# Patient Record
Sex: Female | Born: 1955 | Race: White | Hispanic: No | Smoking: Former smoker
Health system: Southern US, Community
[De-identification: ages and names within clinical notes are randomized; demographics above are authoritative.]

## PROBLEM LIST (undated history)

## (undated) DIAGNOSIS — A419 Sepsis, unspecified organism: Secondary | ICD-10-CM

## (undated) DIAGNOSIS — D649 Anemia, unspecified: Secondary | ICD-10-CM

## (undated) DIAGNOSIS — M8580 Other specified disorders of bone density and structure, unspecified site: Secondary | ICD-10-CM

## (undated) DIAGNOSIS — E785 Hyperlipidemia, unspecified: Secondary | ICD-10-CM

## (undated) DIAGNOSIS — B009 Herpesviral infection, unspecified: Secondary | ICD-10-CM

## (undated) DIAGNOSIS — C4490 Unspecified malignant neoplasm of skin, unspecified: Secondary | ICD-10-CM

## (undated) DIAGNOSIS — N189 Chronic kidney disease, unspecified: Secondary | ICD-10-CM

## (undated) DIAGNOSIS — K5790 Diverticulosis of intestine, part unspecified, without perforation or abscess without bleeding: Secondary | ICD-10-CM

## (undated) DIAGNOSIS — R918 Other nonspecific abnormal finding of lung field: Secondary | ICD-10-CM

## (undated) DIAGNOSIS — S99929A Unspecified injury of unspecified foot, initial encounter: Secondary | ICD-10-CM

## (undated) DIAGNOSIS — Z8489 Family history of other specified conditions: Secondary | ICD-10-CM

## (undated) DIAGNOSIS — K219 Gastro-esophageal reflux disease without esophagitis: Secondary | ICD-10-CM

## (undated) HISTORY — PX: COLONOSCOPY: SHX174

## (undated) HISTORY — PX: TUBAL LIGATION: SHX77

## (undated) HISTORY — PX: KIDNEY STONE SURGERY: SHX686

## (undated) HISTORY — PX: DILATION AND CURETTAGE OF UTERUS: SHX78

## (undated) HISTORY — PX: ESOPHAGOGASTRODUODENOSCOPY: SHX1529

---

## 1997-01-26 HISTORY — PX: COLONOSCOPY: SHX174

## 2004-11-21 ENCOUNTER — Ambulatory Visit: Payer: Self-pay | Admitting: Internal Medicine

## 2006-01-04 ENCOUNTER — Ambulatory Visit: Payer: Self-pay | Admitting: Internal Medicine

## 2007-03-06 ENCOUNTER — Ambulatory Visit: Payer: Self-pay | Admitting: Internal Medicine

## 2007-03-21 ENCOUNTER — Ambulatory Visit: Payer: Self-pay | Admitting: Internal Medicine

## 2007-09-22 ENCOUNTER — Ambulatory Visit: Payer: Self-pay | Admitting: Internal Medicine

## 2008-11-10 ENCOUNTER — Ambulatory Visit: Payer: Self-pay | Admitting: Internal Medicine

## 2008-12-31 ENCOUNTER — Ambulatory Visit: Payer: Self-pay | Admitting: Unknown Physician Specialty

## 2009-11-17 ENCOUNTER — Ambulatory Visit: Payer: Self-pay | Admitting: Internal Medicine

## 2009-11-28 ENCOUNTER — Ambulatory Visit: Payer: Self-pay | Admitting: Internal Medicine

## 2010-11-20 ENCOUNTER — Ambulatory Visit: Payer: Self-pay | Admitting: Internal Medicine

## 2011-11-29 ENCOUNTER — Ambulatory Visit: Payer: Self-pay | Admitting: Obstetrics and Gynecology

## 2011-11-30 ENCOUNTER — Ambulatory Visit: Payer: Self-pay | Admitting: Obstetrics and Gynecology

## 2012-05-26 ENCOUNTER — Ambulatory Visit: Payer: Self-pay | Admitting: Unknown Physician Specialty

## 2012-05-26 HISTORY — PX: COLONOSCOPY: SHX174

## 2013-01-29 ENCOUNTER — Ambulatory Visit: Payer: Self-pay | Admitting: Obstetrics and Gynecology

## 2013-06-26 ENCOUNTER — Ambulatory Visit: Payer: Self-pay | Admitting: Internal Medicine

## 2013-07-16 ENCOUNTER — Ambulatory Visit: Payer: Self-pay | Admitting: Urology

## 2013-07-16 DIAGNOSIS — Z01810 Encounter for preprocedural cardiovascular examination: Secondary | ICD-10-CM

## 2013-10-27 ENCOUNTER — Ambulatory Visit: Payer: Self-pay | Admitting: Urology

## 2013-12-26 ENCOUNTER — Emergency Department: Payer: Self-pay | Admitting: Emergency Medicine

## 2013-12-26 LAB — COMPREHENSIVE METABOLIC PANEL
ALBUMIN: 3.8 g/dL (ref 3.4–5.0)
ALK PHOS: 77 U/L
Anion Gap: 6 — ABNORMAL LOW (ref 7–16)
BUN: 20 mg/dL — AB (ref 7–18)
Bilirubin,Total: 0.9 mg/dL (ref 0.2–1.0)
CREATININE: 1.13 mg/dL (ref 0.60–1.30)
Calcium, Total: 8.9 mg/dL (ref 8.5–10.1)
Chloride: 106 mmol/L (ref 98–107)
Co2: 29 mmol/L (ref 21–32)
GFR CALC NON AF AMER: 55 — AB
GLUCOSE: 121 mg/dL — AB (ref 65–99)
Osmolality: 285 (ref 275–301)
POTASSIUM: 3.7 mmol/L (ref 3.5–5.1)
SGOT(AST): 37 U/L (ref 15–37)
SGPT (ALT): 26 U/L (ref 12–78)
Sodium: 141 mmol/L (ref 136–145)
TOTAL PROTEIN: 7.3 g/dL (ref 6.4–8.2)

## 2013-12-26 LAB — URINALYSIS, COMPLETE
Bilirubin,UR: NEGATIVE
GLUCOSE, UR: NEGATIVE mg/dL (ref 0–75)
Nitrite: NEGATIVE
PH: 5 (ref 4.5–8.0)
PROTEIN: NEGATIVE
RBC,UR: 34 /HPF (ref 0–5)
SPECIFIC GRAVITY: 1.016 (ref 1.003–1.030)
Squamous Epithelial: 7

## 2013-12-26 LAB — CBC
HCT: 35.6 % (ref 35.0–47.0)
HGB: 11.3 g/dL — AB (ref 12.0–16.0)
MCH: 26.5 pg (ref 26.0–34.0)
MCHC: 31.9 g/dL — ABNORMAL LOW (ref 32.0–36.0)
MCV: 83 fL (ref 80–100)
PLATELETS: 255 10*3/uL (ref 150–440)
RBC: 4.28 10*6/uL (ref 3.80–5.20)
RDW: 15.8 % — AB (ref 11.5–14.5)
WBC: 6.2 10*3/uL (ref 3.6–11.0)

## 2014-01-14 ENCOUNTER — Ambulatory Visit: Payer: Self-pay | Admitting: Urology

## 2014-02-10 ENCOUNTER — Ambulatory Visit: Payer: Self-pay | Admitting: Internal Medicine

## 2014-02-11 ENCOUNTER — Ambulatory Visit: Payer: Self-pay | Admitting: Urology

## 2014-02-23 ENCOUNTER — Ambulatory Visit: Payer: Self-pay | Admitting: Urology

## 2014-04-29 DIAGNOSIS — E785 Hyperlipidemia, unspecified: Secondary | ICD-10-CM | POA: Insufficient documentation

## 2014-04-29 DIAGNOSIS — B009 Herpesviral infection, unspecified: Secondary | ICD-10-CM | POA: Insufficient documentation

## 2014-04-29 DIAGNOSIS — N393 Stress incontinence (female) (male): Secondary | ICD-10-CM | POA: Insufficient documentation

## 2014-04-29 DIAGNOSIS — N29 Other disorders of kidney and ureter in diseases classified elsewhere: Secondary | ICD-10-CM | POA: Insufficient documentation

## 2014-04-29 DIAGNOSIS — N23 Unspecified renal colic: Secondary | ICD-10-CM | POA: Insufficient documentation

## 2014-04-29 DIAGNOSIS — D649 Anemia, unspecified: Secondary | ICD-10-CM | POA: Insufficient documentation

## 2014-08-03 ENCOUNTER — Ambulatory Visit: Payer: Self-pay | Admitting: Internal Medicine

## 2014-11-12 ENCOUNTER — Other Ambulatory Visit: Payer: Self-pay | Admitting: Internal Medicine

## 2014-11-12 DIAGNOSIS — R911 Solitary pulmonary nodule: Secondary | ICD-10-CM

## 2015-06-14 ENCOUNTER — Other Ambulatory Visit: Payer: Self-pay | Admitting: Internal Medicine

## 2015-06-14 DIAGNOSIS — Z1231 Encounter for screening mammogram for malignant neoplasm of breast: Secondary | ICD-10-CM

## 2015-06-20 ENCOUNTER — Ambulatory Visit
Admission: RE | Admit: 2015-06-20 | Discharge: 2015-06-20 | Disposition: A | Discharge status: Home / Self Care | Payer: 59 | Source: Ambulatory Visit | Attending: Internal Medicine | Admitting: Internal Medicine

## 2015-06-20 DIAGNOSIS — Z1231 Encounter for screening mammogram for malignant neoplasm of breast: Secondary | ICD-10-CM | POA: Insufficient documentation

## 2015-06-28 ENCOUNTER — Other Ambulatory Visit: Payer: Self-pay | Admitting: Internal Medicine

## 2015-06-28 DIAGNOSIS — N6489 Other specified disorders of breast: Secondary | ICD-10-CM

## 2015-07-13 ENCOUNTER — Ambulatory Visit: Payer: PRIVATE HEALTH INSURANCE

## 2015-07-13 ENCOUNTER — Other Ambulatory Visit: Payer: PRIVATE HEALTH INSURANCE

## 2015-07-28 ENCOUNTER — Ambulatory Visit
Admission: RE | Admit: 2015-07-28 | Discharge: 2015-07-28 | Disposition: A | Discharge status: Home / Self Care | Payer: 59 | Source: Ambulatory Visit | Attending: Internal Medicine | Admitting: Internal Medicine

## 2015-07-28 DIAGNOSIS — R928 Other abnormal and inconclusive findings on diagnostic imaging of breast: Secondary | ICD-10-CM | POA: Diagnosis not present

## 2015-07-28 DIAGNOSIS — N6489 Other specified disorders of breast: Secondary | ICD-10-CM | POA: Diagnosis present

## 2015-09-23 ENCOUNTER — Ambulatory Visit
Admission: RE | Admit: 2015-09-23 | Discharge: 2015-09-23 | Disposition: A | Discharge status: Home / Self Care | Payer: 59 | Source: Ambulatory Visit | Attending: Internal Medicine | Admitting: Internal Medicine

## 2015-09-23 DIAGNOSIS — R911 Solitary pulmonary nodule: Secondary | ICD-10-CM

## 2015-09-23 DIAGNOSIS — R918 Other nonspecific abnormal finding of lung field: Secondary | ICD-10-CM | POA: Diagnosis present

## 2016-02-15 ENCOUNTER — Emergency Department
Admission: EM | Admit: 2016-02-15 | Discharge: 2016-02-16 | Disposition: A | Discharge status: Home / Self Care | Payer: 59 | Attending: Emergency Medicine | Admitting: Emergency Medicine

## 2016-02-15 ENCOUNTER — Emergency Department: Payer: 59

## 2016-02-15 DIAGNOSIS — M25531 Pain in right wrist: Secondary | ICD-10-CM | POA: Diagnosis present

## 2016-02-15 DIAGNOSIS — S52501A Unspecified fracture of the lower end of right radius, initial encounter for closed fracture: Secondary | ICD-10-CM | POA: Diagnosis not present

## 2016-02-15 DIAGNOSIS — Y9373 Activity, racquet and hand sports: Secondary | ICD-10-CM | POA: Diagnosis not present

## 2016-02-15 DIAGNOSIS — W010XXA Fall on same level from slipping, tripping and stumbling without subsequent striking against object, initial encounter: Secondary | ICD-10-CM | POA: Diagnosis not present

## 2016-02-15 DIAGNOSIS — Z87891 Personal history of nicotine dependence: Secondary | ICD-10-CM | POA: Diagnosis not present

## 2016-02-15 DIAGNOSIS — Y999 Unspecified external cause status: Secondary | ICD-10-CM | POA: Diagnosis not present

## 2016-02-15 DIAGNOSIS — Y929 Unspecified place or not applicable: Secondary | ICD-10-CM | POA: Insufficient documentation

## 2016-02-15 HISTORY — DX: Other specified disorders of bone density and structure, unspecified site: M85.80

## 2016-02-15 MED ORDER — MORPHINE SULFATE (PF) 4 MG/ML IV SOLN
INTRAVENOUS | Status: AC
  Administered 2016-02-15: 4 mg via INTRAVENOUS
  Filled 2016-02-15: qty 1

## 2016-02-15 MED ORDER — ONDANSETRON HCL 4 MG/2ML IJ SOLN
INTRAMUSCULAR | Status: AC
  Administered 2016-02-15: 4 mg via INTRAVENOUS
  Filled 2016-02-15: qty 2

## 2016-02-15 MED ORDER — MORPHINE SULFATE (PF) 4 MG/ML IV SOLN
4.0000 mg | Freq: Once | INTRAVENOUS | Status: AC
  Administered 2016-02-15: 4 mg via INTRAVENOUS

## 2016-02-15 MED ORDER — BUPIVACAINE HCL (PF) 0.5 % IJ SOLN
10.0000 mL | Freq: Once | INTRAMUSCULAR | Status: AC
  Administered 2016-02-15: 10 mL
  Filled 2016-02-15: qty 30

## 2016-02-15 MED ORDER — ONDANSETRON HCL 4 MG/2ML IJ SOLN
4.0000 mg | Freq: Once | INTRAMUSCULAR | Status: AC
  Administered 2016-02-15: 4 mg via INTRAVENOUS

## 2016-02-15 MED ORDER — OXYCODONE-ACETAMINOPHEN 5-325 MG PO TABS: 1.0000 | ORAL_TABLET | ORAL | Status: DC | PRN

## 2016-02-15 NOTE — ED Notes (Addendum)
Pt arrived via ems - she was playing tennis and tripped and fell on Lt ankle and Rt wrist - She has obvious deformity to rt wrist and slight swelling to outer aspect of lt ankle

## 2016-02-15 NOTE — ED Notes (Signed)
MD at bedside and reduced fracture - sugar tong splint placed and approved by Dr Derrill KayGoodman

## 2016-02-15 NOTE — ED Notes (Signed)
Dr Derrill KayGoodman at bedside and pt placed in finger trap with 5lb weight on arm - MD will return to attempt to reduce fracture at approx 2310 and then tech will splint

## 2016-02-15 NOTE — Discharge Instructions (Signed)
Please seek medical attention for any high fevers, chest pain, shortness of breath, change in behavior, persistent vomiting, bloody stool or any other new or concerning symptoms.   Wrist Fracture A wrist fracture is a break or crack in one of the bones of your wrist. Your wrist is made up of eight small bones at the palm of your hand (carpal bones) and two long bones that make up your forearm (radius and ulna). CAUSES  A direct blow to the wrist.  Falling on an outstretched hand.  Trauma, such as a car accident or a fall. RISK FACTORS Risk factors for wrist fracture include:  Participating in contact and high-risk sports, such as skiing, biking, and ice skating.  Taking steroid medicines.  Smoking.  Being female.  Being Caucasian.  Drinking more than three alcoholic beverages per day.  Having low or lowered bone density (osteoporosis or osteopenia).  Age. Older adults have decreased bone density.  Women who have had menopause.  History of previous fractures. SIGNS AND SYMPTOMS Symptoms of wrist fractures include tenderness, bruising, and inflammation. Additionally, the wrist may hang in an odd position or appear deformed. DIAGNOSIS Diagnosis may include:  Physical exam.  X-ray. TREATMENT Treatment depends on many factors, including the nature and location of the fracture, your age, and your activity level. Treatment for wrist fracture can be nonsurgical or surgical. Nonsurgical Treatment A plaster cast or splint may be applied to your wrist if the bone is in a good position. If the fracture is not in good position, it may be necessary for your health care provider to realign it before applying a splint or cast. Usually, a cast or splint will be worn for several weeks. Surgical Treatment Sometimes the position of the bone is so far out of place that surgery is required to apply a device to hold it together as it heals. Depending on the fracture, there are a number of  options for holding the bone in place while it heals, such as a cast and metal pins. HOME CARE INSTRUCTIONS  Keep your injured wrist elevated and move your fingers as much as possible.  Do not put pressure on any part of your cast or splint. It may break.  Use a plastic bag to protect your cast or splint from water while bathing or showering. Do not lower your cast or splint into water.  Take medicines only as directed by your health care provider.  Keep your cast or splint clean and dry. If it becomes wet, damaged, or suddenly feels too tight, contact your health care provider right away.  Do not use any tobacco products including cigarettes, chewing tobacco, or electronic cigarettes. Tobacco can delay bone healing. If you need help quitting, ask your health care provider.  Keep all follow-up visits as directed by your health care provider. This is important.  Ask your health care provider if you should take supplements of calcium and vitamins C and D to promote bone healing. SEEK MEDICAL CARE IF:  Your cast or splint is damaged, breaks, or gets wet.  You have a fever.  You have chills.  You have continued severe pain or more swelling than you did before the cast was put on. SEEK IMMEDIATE MEDICAL CARE IF:  Your hand or fingernails on the injured arm turn blue or gray, or feel cold or numb.  You have decreased feeling in the fingers of your injured arm. MAKE SURE YOU:  Understand these instructions.  Will watch your condition.  Will get help right away if you are not doing well or get worse.   This information is not intended to replace advice given to you by your health care provider. Make sure you discuss any questions you have with your health care provider.   Document Released: 04/25/2005 Document Revised: 04/06/2015 Document Reviewed: 08/03/2011 Elsevier Interactive Patient Education [employer]

## 2016-02-15 NOTE — ED Notes (Signed)
Pt arrived via ems - she was playing tennis and tripped and fell on Lt ankle and Rt wrist - She has obvious deformity to rt wrist

## 2016-02-15 NOTE — ED Provider Notes (Signed)
[facility] Emergency Department Provider Note    ____________________________________________  Time seen: ~2210  I have reviewed the triage vital signs and the nursing notes.   HISTORY  Chief Complaint Fall   History limited by: Not Limited   HPI Courtney Cochran is a 60 y.o. female who presents to the emergency department today because of concerns for right wrist pain. Patient was playing tennis when she fell onto the wrist. Immediate onset of pain. The pain is severe. It was accompanied by deformity of that wrist. She denies any numbness or tingling in her fingers. Able to move all fingers. Patient also had some very minor pain of the left ankle. This is also from a fall. She denies hitting her head or any loss of consciousness.   Past Medical History  Diagnosis Date  . Osteopenia     There are no active problems to display for this patient.   History reviewed. No pertinent past surgical history.  No current outpatient prescriptions on file.  Allergies Review of patient's allergies indicates no known allergies.  Family History  Problem Relation Age of Onset  . Breast cancer Neg Hx     Social History Social History  Substance Use Topics  . Smoking status: Former Games developer  . Smokeless tobacco: None  . Alcohol Use: No    Review of Systems  Constitutional: Negative for fever. Cardiovascular: Negative for chest pain. Respiratory: Negative for shortness of breath. Gastrointestinal: Negative for abdominal pain, vomiting and diarrhea. Neurological: Negative for headaches, focal weakness or numbness.   10-point ROS otherwise negative.  ____________________________________________   PHYSICAL EXAM:  VITAL SIGNS: ED Triage Vitals  Enc Vitals Group     BP 02/15/16 2124 132/63 mmHg     Pulse Rate 02/15/16 2127 60     Resp 02/15/16 2127 16     Temp 02/15/16 2127 98 F (36.7 C)     Temp Source 02/15/16 2127 Oral     SpO2  02/15/16 2127 98 %     Weight 02/15/16 2122 152 lb (68.947 kg)     Height 02/15/16 2122  (1.727 m)     Head Cir --      Peak Flow --      Pain Score 02/15/16 2123 10   Constitutional: Alert and oriented. Well appearing and in no distress. Eyes: Conjunctivae are normal. PERRL. Normal extraocular movements. ENT   Head: Normocephalic and atraumatic.   Nose: No congestion/rhinnorhea.   Mouth/Throat: Mucous membranes are moist.   Neck: No stridor. Hematological/Lymphatic/Immunilogical: No cervical lymphadenopathy. Cardiovascular: Normal rate, regular rhythm.  No murmurs, rubs, or gallops. Respiratory: Normal respiratory effort without tachypnea nor retractions. Breath sounds are clear and equal bilaterally. No wheezes/rales/rhonchi. Gastrointestinal: Soft and nontender. No distention. There is no CVA tenderness. Genitourinary: Deferred Musculoskeletal: Obvious deformity to the right wrist. Neurovascularly intact distally. Radial pulse 2+. Left ankle without any concerning swollen. No tenderness. Neurologic:  Normal speech and language. No gross focal neurologic deficits are appreciated.  Skin:  Skin is warm, dry and intact. No rash noted. Psychiatric: Mood and affect are normal. Speech and behavior are normal. Patient exhibits appropriate insight and judgment.  ____________________________________________    LABS (pertinent positives/negatives)  None ____________________________________________   EKG  None  ____________________________________________    RADIOLOGY  Right wrist IMPRESSION: Comminuted angulated distal radius fracture with disruption of the distal radial ulnar joint. Carpals remain aligned with the angulated distal radius fragment.  Left ankle IMPRESSION: Negative.  ____________________________________________  PROCEDURES  Procedure(s) performed: None  Critical Care performed:  No  ____________________________________________   INITIAL IMPRESSION / ASSESSMENT AND PLAN / ED COURSE  Pertinent labs & imaging results that were available during my care of the patient were reviewed by me and considered in my medical decision making (see chart for details).  Patient presented to the emergency department today because of concerns for right wrist pain after he tennis fall. X-rays do confirm a distal radial fracture. Patient was neurovascularly intact. Patient was splinted. Patient will follow-up with orthopedics tomorrow.  ____________________________________________   FINAL CLINICAL IMPRESSION(S) / ED DIAGNOSES  Final diagnoses:  Radius distal fracture, right, closed, initial encounter     Note: This dictation was prepared with Dragon dictation. Any transcriptional errors that result from this process are unintentional    Phineas SemenGraydon Santanna Whitford, MD 02/16/16 [phone]

## 2016-02-20 ENCOUNTER — Encounter
Admission: RE | Admit: 2016-02-20 | Discharge: 2016-02-20 | Disposition: A | Discharge status: Home / Self Care | Payer: PRIVATE HEALTH INSURANCE | Source: Ambulatory Visit | Attending: Surgery | Admitting: Surgery

## 2016-02-20 HISTORY — DX: Family history of other specified conditions: Z84.89

## 2016-02-20 HISTORY — DX: Unspecified injury of unspecified foot, initial encounter: S99.929A

## 2016-02-20 HISTORY — DX: Chronic kidney disease, unspecified: N18.9

## 2016-02-20 HISTORY — DX: Anemia, unspecified: D64.9

## 2016-02-20 HISTORY — DX: Gastro-esophageal reflux disease without esophagitis: K21.9

## 2016-02-20 NOTE — Patient Instructions (Signed)
  Your procedure is scheduled on: 02-21-16 Report to Same Day Surgery 2nd floor medical mall To find out your arrival time please call [phone] between 1PM - 3PM on 02-20-16  Remember: Instructions that are not followed completely may result in serious medical risk, up to and including death, or upon the discretion of your surgeon and anesthesiologist your surgery may need to be rescheduled.    _x___ 1. Do not eat food or drink liquids after midnight. No gum chewing or hard candies.     __x__ 2. No Alcohol for 24 hours before or after surgery.   __x__3. No Smoking for 24 prior to surgery.   ____  4. Bring all medications with you on the day of surgery if instructed.    __x__ 5. Notify your doctor if there is any change in your medical condition     (cold, fever, infections).     Do not wear jewelry, make-up, hairpins, clips or nail polish.  Do not wear lotions, powders, or perfumes. You may wear deodorant.  Do not shave 48 hours prior to surgery. Men may shave face and neck.  Do not bring valuables to the hospital.    [facility] is not responsible for any belongings or valuables.               Contacts, dentures or bridgework may not be worn into surgery.  Leave your suitcase in the car. After surgery it may be brought to your room.  For patients admitted to the hospital, discharge time is determined by your treatment team.   Patients discharged the day of surgery will not be allowed to drive home.    Please read over the following fact sheets that you were given:   [facility] Preparing for Surgery and or MRSA Information   ____ Take these medicines the morning of surgery with A SIP OF WATER:    1. NONE  2.  3.  4.  5.  6.  ____ Fleet Enema (as directed)   ____ Use CHG Soap or sage wipes as directed on instruction sheet   ____ Use inhalers on the day of surgery and bring to hospital day of surgery  ____ Stop metformin 2 days prior to surgery    ____ Take 1/2 of  usual insulin dose the night before surgery and none on the morning of           surgery.   ____ Stop aspirin or coumadin, or plavix  _x__ Stop Anti-inflammatories such as Advil, Aleve, Ibuprofen, Motrin, Naproxen,          Naprosyn, Goodies powders or aspirin products NOW- Ok to take Tylenol.   _X___ Stop supplements until after surgery-LAST DOSE OF FISH OIL TODAY (02-20-16)  ____ Bring C-Pap to the hospital.

## 2016-02-21 ENCOUNTER — Ambulatory Visit: Admission: RE | Admit: 2016-02-21 | Payer: 59 | Source: Ambulatory Visit | Admitting: Surgery

## 2016-02-21 ENCOUNTER — Encounter: Admission: RE | Payer: Self-pay | Source: Ambulatory Visit

## 2016-02-21 SURGERY — OPEN REDUCTION INTERNAL FIXATION (ORIF) DISTAL RADIUS FRACTURE
Anesthesia: Choice | Laterality: Right

## 2016-02-21 MED ORDER — CEFAZOLIN SODIUM-DEXTROSE 2-4 GM/100ML-% IV SOLN: 2.0000 g | Freq: Once | INTRAVENOUS | Status: DC

## 2016-02-22 ENCOUNTER — Ambulatory Visit
Admission: RE | Admit: 2016-02-22 | Discharge: 2016-02-22 | Disposition: A | Discharge status: Home / Self Care | Payer: 59 | Source: Ambulatory Visit | Attending: Surgery | Admitting: Surgery

## 2016-02-22 ENCOUNTER — Ambulatory Visit: Payer: 59 | Admitting: Anesthesiology

## 2016-02-22 ENCOUNTER — Encounter
Admission: RE | Disposition: A | Discharge status: Home / Self Care | Payer: Self-pay | Source: Ambulatory Visit | Attending: Surgery

## 2016-02-22 DIAGNOSIS — S52551A Other extraarticular fracture of lower end of right radius, initial encounter for closed fracture: Secondary | ICD-10-CM | POA: Insufficient documentation

## 2016-02-22 DIAGNOSIS — Z87891 Personal history of nicotine dependence: Secondary | ICD-10-CM | POA: Diagnosis not present

## 2016-02-22 DIAGNOSIS — S52531A Colles' fracture of right radius, initial encounter for closed fracture: Secondary | ICD-10-CM | POA: Diagnosis present

## 2016-02-22 DIAGNOSIS — Z823 Family history of stroke: Secondary | ICD-10-CM | POA: Insufficient documentation

## 2016-02-22 DIAGNOSIS — Z87442 Personal history of urinary calculi: Secondary | ICD-10-CM | POA: Diagnosis not present

## 2016-02-22 DIAGNOSIS — B009 Herpesviral infection, unspecified: Secondary | ICD-10-CM | POA: Insufficient documentation

## 2016-02-22 DIAGNOSIS — W1839XA Other fall on same level, initial encounter: Secondary | ICD-10-CM | POA: Insufficient documentation

## 2016-02-22 DIAGNOSIS — Z79899 Other long term (current) drug therapy: Secondary | ICD-10-CM | POA: Diagnosis not present

## 2016-02-22 DIAGNOSIS — Z8249 Family history of ischemic heart disease and other diseases of the circulatory system: Secondary | ICD-10-CM | POA: Insufficient documentation

## 2016-02-22 DIAGNOSIS — E785 Hyperlipidemia, unspecified: Secondary | ICD-10-CM | POA: Insufficient documentation

## 2016-02-22 HISTORY — PX: OPEN REDUCTION INTERNAL FIXATION (ORIF) DISTAL RADIAL FRACTURE: SHX5989

## 2016-02-22 SURGERY — OPEN REDUCTION INTERNAL FIXATION (ORIF) DISTAL RADIUS FRACTURE
Anesthesia: Monitor Anesthesia Care | Laterality: Right | Wound class: Clean

## 2016-02-22 MED ORDER — LACTATED RINGERS IV SOLN
INTRAVENOUS | Status: DC
  Administered 2016-02-22: 13:00:00 via INTRAVENOUS

## 2016-02-22 MED ORDER — LIDOCAINE HCL (CARDIAC) 20 MG/ML IV SOLN
INTRAVENOUS | Status: DC | PRN
  Administered 2016-02-22: 50 mg via INTRAVENOUS

## 2016-02-22 MED ORDER — METOCLOPRAMIDE HCL 5 MG/ML IJ SOLN: 5.0000 mg | Freq: Three times a day (TID) | INTRAMUSCULAR | Status: DC | PRN

## 2016-02-22 MED ORDER — OXYCODONE HCL 5 MG PO TABS: 5.0000 mg | ORAL_TABLET | ORAL | 0 refills | Status: DC | PRN

## 2016-02-22 MED ORDER — POTASSIUM CHLORIDE IN NACL 20-0.9 MEQ/L-% IV SOLN: INTRAVENOUS | Status: DC

## 2016-02-22 MED ORDER — OXYCODONE HCL 5 MG/5ML PO SOLN: 5.0000 mg | Freq: Once | ORAL | Status: DC | PRN

## 2016-02-22 MED ORDER — OXYCODONE HCL 5 MG PO TABS: 5.0000 mg | ORAL_TABLET | ORAL | Status: DC | PRN

## 2016-02-22 MED ORDER — LACTATED RINGERS IV SOLN: INTRAVENOUS | Status: DC

## 2016-02-22 MED ORDER — METOCLOPRAMIDE HCL 5 MG PO TABS: 5.0000 mg | ORAL_TABLET | Freq: Three times a day (TID) | ORAL | Status: DC | PRN

## 2016-02-22 MED ORDER — CEFAZOLIN SODIUM-DEXTROSE 2-4 GM/100ML-% IV SOLN
2.0000 g | Freq: Once | INTRAVENOUS | Status: AC
  Administered 2016-02-22: 2 g via INTRAVENOUS

## 2016-02-22 MED ORDER — MIDAZOLAM HCL 2 MG/2ML IJ SOLN
1.0000 mg | INTRAMUSCULAR | Status: DC | PRN
  Administered 2016-02-22 (×2): 2 mg via INTRAVENOUS

## 2016-02-22 MED ORDER — ONDANSETRON HCL 4 MG/2ML IJ SOLN
4.0000 mg | Freq: Once | INTRAMUSCULAR | Status: AC
  Administered 2016-02-22: 4 mg via INTRAVENOUS

## 2016-02-22 MED ORDER — FENTANYL CITRATE (PF) 100 MCG/2ML IJ SOLN
50.0000 ug | INTRAMUSCULAR | Status: DC | PRN
  Administered 2016-02-22: 100 ug via INTRAVENOUS

## 2016-02-22 MED ORDER — ROPIVACAINE HCL 5 MG/ML IJ SOLN
INTRAMUSCULAR | Status: DC | PRN
  Administered 2016-02-22: 30 mL via EPIDURAL

## 2016-02-22 MED ORDER — FENTANYL CITRATE (PF) 100 MCG/2ML IJ SOLN: 25.0000 ug | INTRAMUSCULAR | Status: DC | PRN

## 2016-02-22 MED ORDER — PROMETHAZINE HCL 25 MG/ML IJ SOLN: 6.2500 mg | INTRAMUSCULAR | Status: DC | PRN

## 2016-02-22 MED ORDER — BUPIVACAINE-EPINEPHRINE (PF) 0.5% -1:200000 IJ SOLN
INTRAMUSCULAR | Status: DC | PRN
  Administered 2016-02-22: 10 mL via PERINEURAL

## 2016-02-22 MED ORDER — ONDANSETRON HCL 4 MG/2ML IJ SOLN
INTRAMUSCULAR | Status: DC | PRN
  Administered 2016-02-22: 4 mg via INTRAVENOUS

## 2016-02-22 MED ORDER — PROPOFOL 500 MG/50ML IV EMUL
INTRAVENOUS | Status: DC | PRN
  Administered 2016-02-22: 75 ug/kg/min via INTRAVENOUS

## 2016-02-22 MED ORDER — ONDANSETRON HCL 4 MG PO TABS: 4.0000 mg | ORAL_TABLET | Freq: Four times a day (QID) | ORAL | Status: DC | PRN

## 2016-02-22 MED ORDER — OXYCODONE HCL 5 MG PO TABS: 5.0000 mg | ORAL_TABLET | Freq: Once | ORAL | Status: DC | PRN

## 2016-02-22 MED ORDER — ONDANSETRON HCL 4 MG/2ML IJ SOLN: 4.0000 mg | Freq: Four times a day (QID) | INTRAMUSCULAR | Status: DC | PRN

## 2016-02-22 SURGICAL SUPPLY — 49 items
BANDAGE ELASTIC 4 VELCRO NS (GAUZE/BANDAGES/DRESSINGS) ×2 IMPLANT
BENZOIN TINCTURE PRP APPL 2/3 (GAUZE/BANDAGES/DRESSINGS) ×2 IMPLANT
BIT DRILL 2.2 SS TIBIAL (BIT) ×2 IMPLANT
BNDG COHESIVE 4X5 TAN STRL (GAUZE/BANDAGES/DRESSINGS) ×2 IMPLANT
BNDG ESMARK 4X12 TAN STRL LF (GAUZE/BANDAGES/DRESSINGS) ×2 IMPLANT
CANISTER SUCT 1200ML W/VALVE (MISCELLANEOUS) ×2 IMPLANT
CHLORAPREP W/TINT 26ML (MISCELLANEOUS) ×2 IMPLANT
CUFF TOURN SGL QUICK 18 (TOURNIQUET CUFF) ×2 IMPLANT
DRAPE FLUOR MINI C-ARM 54X84 (DRAPES) ×2 IMPLANT
DRAPE SURG 17X11 SM STRL (DRAPES) ×2 IMPLANT
ELECT REM PT RETURN 9FT ADLT (ELECTROSURGICAL) ×2
ELECTRODE REM PT RTRN 9FT ADLT (ELECTROSURGICAL) ×1 IMPLANT
GAUZE PETRO XEROFOAM 1X8 (MISCELLANEOUS) ×2 IMPLANT
GAUZE SPONGE 4X4 12PLY STRL (GAUZE/BANDAGES/DRESSINGS) ×2 IMPLANT
GLOVE BIO SURGEON STRL SZ8 (GLOVE) ×2 IMPLANT
GLOVE INDICATOR 8.0 STRL GRN (GLOVE) ×2 IMPLANT
GOWN STRL REUS W/ TWL LRG LVL3 (GOWN DISPOSABLE) ×1 IMPLANT
GOWN STRL REUS W/ TWL XL LVL3 (GOWN DISPOSABLE) ×1 IMPLANT
GOWN STRL REUS W/TWL LRG LVL3 (GOWN DISPOSABLE) ×1
GOWN STRL REUS W/TWL XL LVL3 (GOWN DISPOSABLE) ×1
K-WIRE 1.6 (WIRE) ×3
K-WIRE FX5X1.6XNS BN SS (WIRE) ×3
KIT ROOM TURNOVER OR (KITS) ×2 IMPLANT
KWIRE FX5X1.6XNS BN SS (WIRE) ×3 IMPLANT
NEEDLE FILTER BLUNT 18X 1/2SAF (NEEDLE) ×1
NEEDLE FILTER BLUNT 18X1 1/2 (NEEDLE) ×1 IMPLANT
NS IRRIG 500ML POUR BTL (IV SOLUTION) ×2 IMPLANT
PACK EXTREMITY ARMC (MISCELLANEOUS) ×2 IMPLANT
PAD CAST CTTN 4X4 STRL (SOFTGOODS) ×1 IMPLANT
PADDING CAST COTTON 4X4 STRL (SOFTGOODS) ×1
PEG LOCKING SMOOTH 2.2X18 (Peg) ×2 IMPLANT
PEG LOCKING SMOOTH 2.2X20 (Screw) ×4 IMPLANT
PEG LOCKING SMOOTH 2.2X22 (Screw) ×2 IMPLANT
PLATE NARROW DVR RIGHT (Plate) ×2 IMPLANT
SCREW  LP NL 2.7X16MM (Screw) ×1 IMPLANT
SCREW 2.7X14MM (Screw) ×1 IMPLANT
SCREW BN 14X2.7XNONLOCK 3 LD (Screw) ×1 IMPLANT
SCREW LOCK 18X2.7X 3 LD TPR (Screw) ×1 IMPLANT
SCREW LOCKING 2.7X18 (Screw) ×1 IMPLANT
SCREW LP NL 2.7X16MM (Screw) ×1 IMPLANT
SCREW NONLOCK 2.7X18MM (Screw) ×2 IMPLANT
SPLINT CAST 1 STEP 3X12 (MISCELLANEOUS) ×2 IMPLANT
STOCKINETTE IMPERVIOUS 9X36 MD (GAUZE/BANDAGES/DRESSINGS) ×2 IMPLANT
STRIP CLOSURE SKIN 1/2X4 (GAUZE/BANDAGES/DRESSINGS) ×2 IMPLANT
SUT PROLENE 4 0 PS 2 18 (SUTURE) IMPLANT
SUT VIC AB 2-0 SH 27 (SUTURE) ×1
SUT VIC AB 2-0 SH 27XBRD (SUTURE) ×1 IMPLANT
SUT VIC AB 3-0 SH 27 (SUTURE) ×1
SUT VIC AB 3-0 SH 27X BRD (SUTURE) ×1 IMPLANT

## 2016-02-22 NOTE — Progress Notes (Signed)
Assisted Dorna Leitz ANMD with right, ultrasound guided, supraclavicular block. Side rails up, monitors on throughout procedure. See vital signs in flow sheet. Tolerated Procedure well.

## 2016-02-22 NOTE — Op Note (Signed)
Pre-Op Diagnosis:   Closed acute extra-articular distal radius fracture, right wrist.  Post-Op Diagnosis:   Same.  Procedure:   Open reduction and internal fixation of right distal radius fracture.  Surgeon:   Maryagnes Amos, MD  Assistant:   None  Anesthesia:   IV sedation with an interscalene block placed preoperatively by the anesthesiologist  Findings:   As above.  Complications:   None  EBL:   10 cc  Fluids:   900 cc crystalloid  TT:   91 minutes at 250 mmHg  Drains:   None  Closure:   3-0 Vicryl subcuticular sutures  Implants:   Biomet DVR Cross-locked narrow precontoured distal radius mini-locking plate.  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 She presented to the emergency room where x-rays demonstrated the above-noted injury. She was reduced and splinted, then followed up in the office where the decision was made to proceed with formal open reduction and internal fixation of the fracture. She presents at this time for definitive management of her injury.  Procedure:   The patient underwent placement of an interscalene block in the preoperative holding area before she was brought into the operating room and lain in the supine position. After adequate IV sedation was obtained, a timeout was performed to verify the appropriate surgical site. The patient's right hand and upper extremity were prepped with ChloraPrep solution before being draped sterilely. Preoperative antibiotics were administered. The limb was exsanguinated with an Esmarch and the tourniquet inflated to 250 mmHg. An approximately 7-8 cm incision was made over the volar aspect of the distal radius beginning at the volar flexion crease and extending proximally along the flexor carpi radialis tendon. The incision was carried down through the subcutaneous tissues to expose the  superficial retinaculum. This was split the length of the incision directly over the flexor carpi radialis tendon. The FCR sheath was opened and the tendon retracted ulnarly to protect the median nerve. The floor of the FCR sheath was opened to expose the pronator quadratus muscle. This was released along the radial insertion and the muscle was retracted ulnarly to expose the distal radius. The fracture was identified and soft tissues elevated off the distal metaphyseal region for several centimeters. The fracture was then reduced using manual manipulation and gentle fingertrap traction.   The appropriate sized plate was selected and positioned on the distal radius. A guidewire was placed through the distal hole and its position verified using FluoroScan imaging in AP and lateral projections. A second guidewire was placed through the radial styloid hole to control the radial-ulnar positioning of the plate. Once again, the plate position was verified in AP and lateral projections and found to be excellent. The plate was secured to the distal fragment using two bicortical locking screws and four locking pins of appropriate length. The plate was carefully lowered onto the volar metaphyseal surface, reducing the fracture in the process. Again the position of the plate was verified using FluoroScan imaging in AP and lateral projections and found to be excellent. The plate was secured using two more nonlocking bicortical screws proximally. The adequacy of hardware position and fracture reduction was verified using FluoroScan imaging in AP, lateral, and several additional oblique projections to be sure that the hardware did not enter the joint nor did it penetrate dorsally.  The wound was copiously irrigated with sterile  saline solution before the pronator quadratus was reapproximated using 2-0 Vicryl interrupted sutures. The subcutaneous tissues were closed using 2-0 Vicryl interrupted sutures before the subcuticular  layer was closed using 3-0 Vicryl inverted interrupted sutures. Benzoin and Steri-Strips are applied to the skin. A total of 10 cc of 0.5% plain Sensorcaine was injected in and around the incision site to help with postoperative analgesia before a sterile bulky dressing was applied to the wound. The patient was placed into a volar splint maintaining the wrist in neutral position before the patient was awakened and returned to the recovery room in satisfactory condition after tolerating the procedure well.

## 2016-02-22 NOTE — H&P (Signed)
Paper H&P to be scanned into permanent record. H&P reviewed. No changes. 

## 2016-02-22 NOTE — Discharge Instructions (Signed)
General Anesthesia, Adult, Care After °Refer to this sheet in the next few weeks. These instructions provide you with information on caring for yourself after your procedure. Your health care provider may also give you more specific instructions. Your treatment has been planned according to current medical practices, but problems sometimes occur. Call your health care provider if you have any problems or questions after your procedure. °WHAT TO EXPECT AFTER THE PROCEDURE °After the procedure, it is typical to experience: °· Sleepiness. °· Nausea and vomiting. °HOME CARE INSTRUCTIONS °· For the first 24 hours after general anesthesia: °¨ Have a responsible person with you. °¨ Do not drive a car. If you are alone, do not take public transportation. °¨ Do not drink alcohol. °¨ Do not take medicine that has not been prescribed by your health care provider. °¨ Do not sign important papers or make important decisions. °¨ You may resume a normal diet and activities as directed by your health care provider. °· Change bandages (dressings) as directed. °· If you have questions or problems that seem related to general anesthesia, call the hospital and ask for the anesthetist or anesthesiologist on call. °SEEK MEDICAL CARE IF: °· You have nausea and vomiting that continue the day after anesthesia. °· You develop a rash. °SEEK IMMEDIATE MEDICAL CARE IF:  °· You have difficulty breathing. °· You have chest pain. °· You have any allergic problems. °  °This information is not intended to replace advice given to you by your health care provider. Make sure you discuss any questions you have with your health care provider. °  °Document Released: 10/22/2000 Document Revised: 08/06/2014 Document Reviewed: 11/14/2011 °Elsevier Interactive Patient Education ©2016 [employer] ° °Keep splint dry and intact. °Keep hand elevated above heart level. °Apply ice to affected area frequently. °Return for follow-up in 10-14 days or as scheduled. °

## 2016-02-22 NOTE — Anesthesia Postprocedure Evaluation (Signed)
Anesthesia Post Note  Patient: Courtney Cochran  Procedure(s) Performed: Procedure(s) (LRB): OPEN REDUCTION INTERNAL FIXATION (ORIF) DISTAL RADIAL FRACTURE (Right)  Patient location during evaluation: PACU Anesthesia Type: MAC and Regional Level of consciousness: awake and alert Pain management: pain level controlled Vital Signs Assessment: post-procedure vital signs reviewed and stable Respiratory status: spontaneous breathing, nonlabored ventilation, respiratory function stable and patient connected to nasal cannula oxygen Cardiovascular status: stable and blood pressure returned to baseline Anesthetic complications: no    Sujey Gundry C

## 2016-02-22 NOTE — Anesthesia Procedure Notes (Signed)
Procedure Name: MAC Performed by: Lakeysha Slutsky Pre-anesthesia Checklist: Patient identified, Emergency Drugs available, Suction available, Timeout performed and Patient being monitored Patient Re-evaluated:Patient Re-evaluated prior to inductionOxygen Delivery Method: Simple face mask Placement Confirmation: positive ETCO2       

## 2016-02-22 NOTE — Transfer of Care (Signed)
Immediate Anesthesia Transfer of Care Note  Patient: Courtney Cochran  Procedure(s) Performed: Procedure(s): OPEN REDUCTION INTERNAL FIXATION (ORIF) DISTAL RADIAL FRACTURE (Right)  Patient Location: PACU  Anesthesia Type: MAC, Regional  Level of Consciousness: awake, alert  and patient cooperative  Airway and Oxygen Therapy: Patient Spontanous Breathing and Patient connected to supplemental oxygen  Post-op Assessment: Post-op Vital signs reviewed, Patient's Cardiovascular Status Stable, Respiratory Function Stable, Patent Airway and No signs of Nausea or vomiting  Post-op Vital Signs: Reviewed and stable  Complications: No apparent anesthesia complications

## 2016-02-22 NOTE — Anesthesia Preprocedure Evaluation (Addendum)
Anesthesia Evaluation  Patient identified by MRN, date of birth, ID band Patient awake    Reviewed: Allergy & Precautions, NPO status , Patient's Chart, lab work & pertinent test results  Airway Mallampati: II  TM Distance: >3 FB Neck ROM: Full    Dental no notable dental hx.    Pulmonary neg pulmonary ROS, former smoker,    Pulmonary exam normal breath sounds clear to auscultation       Cardiovascular negative cardio ROS Normal cardiovascular exam Rhythm:Regular Rate:Normal     Neuro/Psych negative neurological ROS  negative psych ROS   GI/Hepatic negative GI ROS, Neg liver ROS, neg GERD  ,  Endo/Other  negative endocrine ROS  Renal/GU negative Renal ROSKidney stones  negative genitourinary   Musculoskeletal negative musculoskeletal ROS (+)   Abdominal   Peds negative pediatric ROS (+)  Hematology negative hematology ROS (+)   Anesthesia Other Findings   Reproductive/Obstetrics negative OB ROS                             Anesthesia Physical Anesthesia Plan  ASA: II  Anesthesia Plan: MAC and Regional   Post-op Pain Management:    Induction: Intravenous  Airway Management Planned:   Additional Equipment:   Intra-op Plan:   Post-operative Plan: Extubation in OR  Informed Consent: I have reviewed the patients History and Physical, chart, labs and discussed the procedure including the risks, benefits and alternatives for the proposed anesthesia with the patient or authorized representative who has indicated his/her understanding and acceptance.   Dental advisory given  Plan Discussed with: CRNA  Anesthesia Plan Comments: (Supraclav block with IV sedation)       Anesthesia Quick Evaluation

## 2016-02-22 NOTE — Anesthesia Procedure Notes (Signed)
Anesthesia Regional Block:  Supraclavicular block  Pre-Anesthetic Checklist: ,, timeout performed, Correct Patient, Correct Site, Correct Laterality, Correct Procedure, Correct Position, site marked, Risks and benefits discussed,  Surgical consent,  Pre-op evaluation,  At surgeon's request and post-op pain management  Laterality: Right  Prep: chloraprep       Needles:  Injection technique: Single-shot  Needle Type: Echogenic Stimulator Needle      Needle Gauge: 21 and 21 G    Additional Needles:  Procedures: ultrasound guided (picture in chart) and nerve stimulator Supraclavicular block  Nerve Stimulator or Paresthesia:  Response: bicep contraction, 0.45 mA,   Additional Responses:   Narrative:  Start time: 02/22/2016 1:02 PM End time: 02/22/2016 1:13 PM Injection made incrementally with aspirations every 5 mL.  Performed by: Personally   Additional Notes: Functioning IV was confirmed and monitors applied.  Sterile prep and drape,hand hygiene and sterile gloves were used.Ultrasound guidance: relevant anatomy identified, needle position confirmed, local anesthetic spread visualized around nerve(s)., vascular puncture avoided.  Image printed for medical record.  Negative aspiration and negative test dose prior to incremental administration of local anesthetic. The patient tolerated the procedure well. Vitals signes recorded in RN notes. Excellent image. No needle to nerve contact. No complications. Needle, local, nerves, and blood vessels all well visualized throughout entire procedure. Pt remained awake. No paresthesias

## 2016-02-24 ENCOUNTER — Encounter: Payer: Self-pay | Admitting: Surgery

## 2016-04-17 DIAGNOSIS — R310 Gross hematuria: Secondary | ICD-10-CM | POA: Insufficient documentation

## 2016-04-27 ENCOUNTER — Ambulatory Visit: Payer: PRIVATE HEALTH INSURANCE | Admitting: Occupational Therapy

## 2016-04-30 ENCOUNTER — Ambulatory Visit: Payer: 59 | Attending: Surgery | Admitting: Occupational Therapy

## 2016-04-30 DIAGNOSIS — M25631 Stiffness of right wrist, not elsewhere classified: Secondary | ICD-10-CM | POA: Diagnosis present

## 2016-04-30 DIAGNOSIS — M25531 Pain in right wrist: Secondary | ICD-10-CM

## 2016-04-30 DIAGNOSIS — M6281 Muscle weakness (generalized): Secondary | ICD-10-CM | POA: Diagnosis present

## 2016-04-30 NOTE — Patient Instructions (Signed)
Heat  PROM for wrist ext, flexion, RD and UD  2 lbs weight for wrist ext, flexion, RD and UD , sup/pro  10 reps all  Hold 3 sec  Green putty for lat and 3 point grip  Ed on lifting and carrying objects with palm up

## 2016-04-30 NOTE — Therapy (Signed)
[facility] [address] [place], [address] Phone: [phone]   Fax:  [phone]  Occupational Therapy Treatment  Patient Details  Name: Courtney Cochran MRN: [id number] Date of Birth: 1956-04-15 Referring Provider: Joice Lofts  Encounter Date: 04/30/2016      OT End of Session - 04/30/16 1226    Visit Number 1   Number of Visits 6   Date for OT Re-Evaluation 05/28/16   OT Start Time 1116   OT Stop Time 1202   OT Time Calculation (min) 46 min   Activity Tolerance Patient tolerated treatment well   Behavior During Therapy [facility] for tasks assessed/performed      Past Medical History:  Diagnosis Date  . Anemia    H/O 18 YEARS AGO  . Chronic kidney disease    STONES  . Family history of adverse reaction to anesthesia    PTS SISTER HAS WOKEN UP TWICE DURING SURGERY  . Foot injury    LEFT-FOOT BRUISED AND SWOLLEN PER PT(RELATED TO HER FALL THAT BROKE HER WRIST)  . GERD (gastroesophageal reflux disease)    RARE  . Osteopenia     Past Surgical History:  Procedure Laterality Date  . COLONOSCOPY    . DILATION AND CURETTAGE OF UTERUS    . KIDNEY STONE SURGERY    . OPEN REDUCTION INTERNAL FIXATION (ORIF) DISTAL RADIAL FRACTURE Right 02/22/2016   Procedure: OPEN REDUCTION INTERNAL FIXATION (ORIF) DISTAL RADIAL FRACTURE;  Surgeon: Christena Flake, MD;  Location: [facility];  Service: Orthopedics;  Laterality: Right;    There were no vitals filed for this visit.      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 Yes   Pain Score 1    Pain  Location Wrist   Pain Orientation Right   Pain Descriptors / Indicators Tightness   Pain Type Surgical pain   Pain Onset More than a month ago            [facility] OT Assessment - 04/30/16 0001      Assessment   Diagnosis R Colles fx distal radius ORIF   Referring Provider Poggi   Onset Date 02/22/16     Balance Screen   Has the patient fallen in the past 6 months Yes   How many times? 1   Has the patient had a decrease in activity level because of a fear of falling?  No   Is the patient reluctant to leave their home because of a fear of falling?  No     Prior Function   Vocation Full time employment   Leisure rep- doing trade shows, travel a lot, R hand dominant , work out with trainer,  read book , play some on tablet, play tennis      AROM   Right Wrist Extension 64 Degrees   Right Wrist Flexion 60 Degrees   Right Wrist Radial Deviation 22 Degrees   Right Wrist Ulnar Deviation 30 Degrees   Left Wrist Extension 82  Degrees   Left Wrist Flexion 85 Degrees   Left Wrist Radial Deviation 26 Degrees   Left Wrist Ulnar Deviation 40 Degrees     Strength   Right Hand Grip (lbs) 60  extended arm 60   Right Hand Lateral Pinch 14 lbs   Right Hand 3 Point Pinch 9 lbs   Left Hand Grip (lbs) 61  extended arm 60   Left Hand Lateral Pinch 17 lbs   Left Hand 3 Point Pinch 13 lbs      Fluidotherapy done with pt - AROM for wrist in all planes to increase ROM and decrease pain  Prior to review of HEP - pt showed increase AROM flexion and extention  HEP  Heat  PROM for wrist ext, flexion, RD and UD  2 lbs weight for wrist ext, flexion, RD and UD , sup/pro  10 reps all  Hold 3 sec  Green putty for lat and 3 point grip  Ed on lifting and carrying objects with palm                     OT Education - 04/30/16 1226    Education provided Yes   Education Details HEP update and findings of eval   Person(s) Educated Patient   Methods Explanation;Demonstration;Tactile  cues;Verbal cues   Comprehension Verbal cues required;Returned demonstration;Verbalized understanding          OT Short Term Goals - 04/30/16 1243      OT SHORT TERM GOAL #1   Title Pain on PRWHe improve with 10 points    Baseline 14/50 at eval on PRWHE   Period Weeks   Status New     OT SHORT TERM GOAL #2   Title Wrist AROM improve  by 5-15 degrees to push up from chair and hit some  tennisballs in gym   Baseline Wrist flexion , ext 64 and 60 ; RD 22, UD 30    Time 3   Period Weeks   Status New           OT Long Term Goals - 04/30/16 1245      OT LONG TERM GOAL #1   Title Strength in grip and prehension improve for pt to carry 10 lbs , turn knobs at work , 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    Baseline prehension 9-14 on R , L 13-17 ; grip 60 R and L    Time 4   Period Weeks   Status New     OT LONG TERM GOAL #2   Title Pt to be ind in HEP to increase ROM and grip - return to prior level of function    Baseline very little knowledge   Time 4   Period Weeks   Status New               Plan - 04/30/16 1227    Clinical Impression Statement Pt present about 9 1/2 wks out from R distal radius fx - with ORIF - pt is R hand dominant - she show decrease AROM in wrist , and decrease strength end range - decrease prehension strength compare to the L - more than grip - 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 - tender but negative for pain  with 3rd digit and wrist extention - motion and strenght  impairement limiting her functional use    Rehab Potential Excellent   OT Frequency 2x / week   OT Duration 4 weeks  OT Treatment/Interventions Self-care/ADL training;Patient/family education;Fluidtherapy;Therapeutic exercises;Passive range of motion;Manual Therapy   Plan review HEP , assess progress   OT Home Exercise Plan see pt instruction   Consulted and Agree with Plan of Care Patient      Patient will benefit from skilled therapeutic intervention in order to improve  the following deficits and impairments:  Decreased range of motion, Impaired flexibility, Impaired UE functional use, Pain, Decreased strength  Visit Diagnosis: Stiffness of right wrist, not elsewhere classified - Plan: Ot plan of care cert/re-cert  Muscle weakness (generalized) - Plan: Ot plan of care cert/re-cert  Pain in right wrist - Plan: Ot plan of care cert/re-cert    Problem List There are no active problems to display for this patient.   Oletta CohnuPreez, Demetrius Mahler OTR/L,CLT 04/30/2016, 12:51 PM  [place] [facility] [address] [place], [place], [address] Phone: [phone]   Fax:  [phone]  Name: Courtney Cochran MRN: [id number] Date of Birth: 12-Sep-1955

## 2016-05-03 ENCOUNTER — Ambulatory Visit: Payer: 59 | Admitting: Occupational Therapy

## 2016-05-03 DIAGNOSIS — M6281 Muscle weakness (generalized): Secondary | ICD-10-CM

## 2016-05-03 DIAGNOSIS — M25631 Stiffness of right wrist, not elsewhere classified: Secondary | ICD-10-CM

## 2016-05-03 DIAGNOSIS — M25531 Pain in right wrist: Secondary | ICD-10-CM

## 2016-05-03 NOTE — Patient Instructions (Addendum)
HEP  Heat  PROM for wrist flexion ,ext, RD and UD -   Upgrade HEP to 2 lbs for wrist flexion , ext , RD ,UD and sup/pro 12 reps all Stop prior to pain   Green putty for grip , lat and 3 point  Needed mod A to do correctly with thumb position  Pt video HEP to do correctly at home

## 2016-05-03 NOTE — Therapy (Signed)
[facility] [address] [place], [place], [address] Phone: [phone]   Fax:  [phone]  Occupational Therapy Treatment  Patient Details  Name: Courtney Cochran MRN: [id number] Date of Birth: 1956/02/28 Referring Provider: Joice LoftsPoggi  Encounter Date: 05/03/2016      OT End of Session - 05/03/16 1003    Visit Number 2   Number of Visits 6   Date for OT Re-Evaluation 05/28/16   OT Start Time 0946   OT Stop Time 1028   OT Time Calculation (min) 42 min   Activity Tolerance Patient tolerated treatment well   Behavior During Therapy [facility] for tasks assessed/performed      Past Medical History:  Diagnosis Date  . Anemia    H/O 18 YEARS AGO  . Chronic kidney disease    STONES  . Family history of adverse reaction to anesthesia    PTS SISTER HAS WOKEN UP TWICE DURING SURGERY  . Foot injury    LEFT-FOOT BRUISED AND SWOLLEN PER PT(RELATED TO HER FALL THAT BROKE HER WRIST)  . GERD (gastroesophageal reflux disease)    RARE  . Osteopenia     Past Surgical History:  Procedure Laterality Date  . COLONOSCOPY    . DILATION AND CURETTAGE OF UTERUS    . KIDNEY STONE SURGERY    . OPEN REDUCTION INTERNAL FIXATION (ORIF) DISTAL RADIAL FRACTURE Right 02/22/2016   Procedure: OPEN REDUCTION INTERNAL FIXATION (ORIF) DISTAL RADIAL FRACTURE;  Surgeon: Christena FlakeJohn J Poggi, MD;  [facility];  Service: Orthopedics;  Laterality: Right;    There were no vitals filed for this visit.      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 Yes   Pain Score 1    Pain Location Wrist   Pain Orientation Right   Pain  Descriptors / Indicators Aching;Tightness   Pain Type Surgical pain                      OT Treatments/Exercises (OP) - 05/03/16 0001      RUE Fluidotherapy   Number Minutes Fluidotherapy 10 Minutes   RUE Fluidotherapy Location Hand;Wrist   [facility] to decrease pain and increase ROM       Showed progress in wrist flexion and extention  After fluido  PROM for wrist flexion ,ext, RD and UD - reviewed - needed Mod A and min v/c   Upgrade HEP to 2 lbs for wrist flexion , ext , RD ,UD and sup/pro 12 reps all Stop prior to pain  Needed mod A and v/c to do correctly and hold wrist correct position with pronation and UD/RD , wrist ext  Green putty for grip , lat and 3 point  Needed mod A to do correctly with thumb position  Pt video HEP to do correctly at home              OT Education - 05/03/16 1003    Education provided Yes   Education Details Reviewed HEP  Person(s) Educated Patient   Methods Explanation;Demonstration;Tactile cues;Verbal cues;Handout   Comprehension Verbal cues required;Returned demonstration;Verbalized understanding          OT Short Term Goals - 04/30/16 1243      OT SHORT TERM GOAL #1   Title Pain on PRWHe improve with 10 points    Baseline 14/50 at eval on PRWHE   Period Weeks   Status New     OT SHORT TERM GOAL #2   Title Wrist AROM improve  by 5-15 degrees to push up from chair and hit some  tennisballs in gym   Baseline Wrist flexion , ext 64 and 60 ; RD 22, UD 30    Time 3   Period Weeks   Status New           OT Long Term Goals - 04/30/16 1245      OT LONG TERM GOAL #1   Title Strength in grip and prehension improve for pt to carry 10 lbs , turn knobs at work , 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    Baseline prehension 9-14 on R , L 13-17 ; grip 60 R and L    Time 4   Period Weeks   Status New     OT LONG TERM GOAL #2   Title Pt to be ind in HEP to increase ROM and grip - return to prior  level of function    Baseline very little knowledge   Time 4   Period Weeks   Status New               Plan - 05/03/16 1003    Clinical Impression Statement Pt made progress just in 3 days in wrist flexion and extention  pt needed mod A to reviewed HEP to do correctly - add 2lbs and putty to HEP - pt to do week - and return in week    Rehab Potential Excellent   OT Frequency 2x / week   OT Duration 4 weeks   OT Treatment/Interventions Self-care/ADL training;Patient/family education;Fluidtherapy;Therapeutic exercises;Passive range of motion;Manual Therapy   OT Home Exercise Plan see pt instruction   Consulted and Agree with Plan of Care Patient      Patient will benefit from skilled therapeutic intervention in order to improve the following deficits and impairments:  Decreased range of motion, Impaired flexibility, Impaired UE functional use, Pain, Decreased strength  Visit Diagnosis: Muscle weakness (generalized)  Pain in right wrist  Stiffness of right wrist, not elsewhere classified    Problem List There are no active problems to display for this patient.   Oletta Cohn OTR/L,CLT 05/03/2016, 10:31 AM  [place] [facility] PHYSICAL AND SPORTS MEDICINE [address], [place], [address] Phone: [phone]   Fax:  [phone]  Name: Courtney Cochran MRN: [id number] Date of Birth: 01-26-1956

## 2016-05-08 ENCOUNTER — Encounter: Payer: PRIVATE HEALTH INSURANCE | Admitting: Occupational Therapy

## 2016-05-10 ENCOUNTER — Ambulatory Visit: Payer: 59 | Admitting: Occupational Therapy

## 2016-05-10 DIAGNOSIS — M25631 Stiffness of right wrist, not elsewhere classified: Secondary | ICD-10-CM

## 2016-05-10 DIAGNOSIS — M6281 Muscle weakness (generalized): Secondary | ICD-10-CM

## 2016-05-10 DIAGNOSIS — M25531 Pain in right wrist: Secondary | ICD-10-CM

## 2016-05-10 NOTE — Patient Instructions (Addendum)
  Same HEP but do them separate if she do not have time to do all together   and decrease to 1 set of 12 reps for weight

## 2016-05-10 NOTE — Therapy (Signed)
[facility] [address] [place], [place], [address] Phone: [phone]   Fax:  [phone]  Occupational Therapy Treatment  Patient Details  Name: Courtney Cochran MRN: [id number] Date of Birth: 09-May-1956 Referring Provider: Joice LoftsPoggi  Encounter Date: 05/10/2016      OT End of Session - 05/10/16 1057    Visit Number 3   Number of Visits 6   Date for OT Re-Evaluation 05/28/16   OT Start Time 0946   OT Stop Time 1025   OT Time Calculation (min) 39 min   Activity Tolerance Patient tolerated treatment well   Behavior During Therapy [facility] for tasks assessed/performed      Past Medical History:  Diagnosis Date  . Anemia    H/O 18 YEARS AGO  . Chronic kidney disease    STONES  . Family history of adverse reaction to anesthesia    PTS SISTER HAS WOKEN UP TWICE DURING SURGERY  . Foot injury    LEFT-FOOT BRUISED AND SWOLLEN PER PT(RELATED TO HER FALL THAT BROKE HER WRIST)  . GERD (gastroesophageal reflux disease)    RARE  . Osteopenia     Past Surgical History:  Procedure Laterality Date  . COLONOSCOPY    . DILATION AND CURETTAGE OF UTERUS    . KIDNEY STONE SURGERY    . OPEN REDUCTION INTERNAL FIXATION (ORIF) DISTAL RADIAL FRACTURE Right 02/22/2016   Procedure: OPEN REDUCTION INTERNAL FIXATION (ORIF) DISTAL RADIAL FRACTURE;  Surgeon: Christena FlakeJohn J Poggi, MD;  [facility];  Service: Orthopedics;  Laterality: Right;    There were no vitals filed for this visit.      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 Yes   Pain Score 1    Pain Location Wrist   Pain Orientation Right   Pain Descriptors / Indicators Aching   Pain Type Surgical pain            [facility] OT Assessment - 05/10/16 0001      AROM   Right Wrist Extension 80 Degrees   Right Wrist Flexion 74 Degrees   Right Wrist Radial Deviation 26 Degrees   Right Wrist Ulnar Deviation 35 Degrees                  OT Treatments/Exercises (OP) - 05/10/16 0001      RUE Fluidotherapy   Number Minutes Fluidotherapy 10 Minutes   RUE Fluidotherapy Location Hand;Wrist   Comments AROM for wrist in all planes  - to increase ROM - and  decrease pain       Showed progress in wrist flexion and extention , UD After fluido  PROM for wrist flexion ,ext, RD and UD - reviewed - needed min v/c   3 lbs for wrist flexion , ext , RD ,UD and sup/pro 12 reps all Stop prior to pain  Needed min A and v/c to do correctly and hold wrist correct position with pronation and UD/RD , wrist ext   Green putty for grip  Add pulling and twisting - not to do 3 point and lat grip  Pt to do HEP if need separate - ROM , then weight and then putty  If busy during travelling - and do not have time to sit still for 20-30 min   Pt has video HEP to do correctly at home              OT Education - 05/10/16 1057    Education provided Yes   Education Details HEP change   Person(s) Educated Patient   Methods Explanation;Demonstration;Tactile cues;Verbal cues;Handout   Comprehension Verbal cues required;Returned demonstration;Verbalized understanding          OT Short Term Goals - 04/30/16 1243      OT SHORT TERM GOAL #1   Title Pain on PRWHe improve with 10 points    Baseline 14/50 at eval on PRWHE   Period Weeks   Status New     OT SHORT TERM GOAL #2   Title Wrist AROM improve  by 5-15 degrees to push up from chair and hit some  tennisballs in gym   Baseline Wrist flexion , ext 64 and 60 ; RD 22, UD 30    Time 3   Period Weeks   Status New           OT Long Term Goals - 04/30/16 1245      OT LONG TERM GOAL #1   Title Strength in  grip and prehension improve for pt to carry 10 lbs , turn knobs at work , 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    Baseline prehension 9-14 on R , L 13-17 ; grip 60 R and L    Time 4   Period Weeks   Status New     OT LONG TERM GOAL #2   Title Pt to be ind in HEP to increase ROM and grip - return to prior level of function    Baseline very little knowledge   Time 4   Period Weeks   Status New               Plan - 05/10/16 1058    Clinical Impression Statement Pt made progress since SOC in ROM at wrist in all planes - but did not had time with her travelling to do her strengthening - pt to do them seperate if needed - and put hold on lat and 3 point grip putty - because of some soreness in thumb CMC    Rehab Potential Excellent   OT Frequency 2x / week   OT Duration 4 weeks   OT Treatment/Interventions Self-care/ADL training;Patient/family education;Fluidtherapy;Therapeutic exercises;Passive range of motion;Manual Therapy   Plan assess if she was able to do HEP    OT Home Exercise Plan see pt instruction   Consulted and Agree with Plan of Care Patient      Patient will benefit from skilled therapeutic intervention in order to improve the following deficits and impairments:  Decreased range of motion, Impaired flexibility, Impaired UE functional use, Pain, Decreased strength  Visit Diagnosis: Muscle weakness (generalized)  Pain in right wrist  Stiffness of right wrist, not elsewhere classified    Problem List There are no active problems to display for this patient.   Oletta Cohn OTR/L,CLT 05/10/2016, 12:11 PM  [place] [facility] PHYSICAL AND SPORTS MEDICINE [address] [place], [address] Phone: [phone]   Fax:  [phone]  Name: Courtney Cochran MRN: [id number] Date of Birth: [birth year]

## 2016-05-16 ENCOUNTER — Ambulatory Visit: Payer: 59 | Admitting: Occupational Therapy

## 2016-05-16 DIAGNOSIS — M25631 Stiffness of right wrist, not elsewhere classified: Secondary | ICD-10-CM | POA: Diagnosis not present

## 2016-05-16 DIAGNOSIS — M25531 Pain in right wrist: Secondary | ICD-10-CM

## 2016-05-16 DIAGNOSIS — M6281 Muscle weakness (generalized): Secondary | ICD-10-CM

## 2016-05-16 NOTE — Patient Instructions (Addendum)
  Reviewed HEP and setting up /changes for discharge HEP  PROM for wrist flexion and ext  can stop RD and UD   3 lbs for wrist flexion , ext , RD ,UD and sup/pro 12 reps all Stop prior to pain   Green putty for grip - can add 1/2 of dark blue to green to increase grip  Cont to do grip , pulling and twisting - and add lat grip  Fitted with Benik neoprene splint to use when starting back with tennis but only 30 min at time  Wall push ups - 12 reps - no pain  Can do at home

## 2016-05-16 NOTE — Therapy (Signed)
[place] PHYSICAL AND SPORTS MEDICINE [address] [place], [address] Phone: [phone]   Fax:  [phone]  Occupational Therapy Treatment and discharge   Patient Details  Name: Courtney Cochran MRN: [id number] Date of Birth: [birth year] Referring Provider: Roland Rack  Encounter Date: 05/16/2016      OT End of Session - 05/16/16 1906    Visit Number 4   Number of Visits 4   Date for OT Re-Evaluation 05/16/16   OT Start Time 0845   OT Stop Time 0922   OT Time Calculation (min) 37 min   Activity Tolerance Patient tolerated treatment well   Behavior During Therapy [facility] for tasks assessed/performed      Past Medical History:  Diagnosis Date  . Anemia    H/O 18 YEARS AGO  . Chronic kidney disease    STONES  . Family history of adverse reaction to anesthesia    PTS SISTER HAS WOKEN UP TWICE DURING SURGERY  . Foot injury    LEFT-FOOT BRUISED AND SWOLLEN PER PT(RELATED TO HER FALL THAT BROKE HER WRIST)  . GERD (gastroesophageal reflux disease)    RARE  . Osteopenia     Past Surgical History:  Procedure Laterality Date  . COLONOSCOPY    . DILATION AND CURETTAGE OF UTERUS    . KIDNEY STONE SURGERY    . OPEN REDUCTION INTERNAL FIXATION (ORIF) DISTAL RADIAL FRACTURE Right 02/22/2016   Procedure: OPEN REDUCTION INTERNAL FIXATION (ORIF) DISTAL RADIAL FRACTURE;  Surgeon: Corky Mull, MD;  Location: [place];  Service: Orthopedics;  Laterality: Right;    There were no vitals filed for this visit.      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             [facility] OT Assessment - 05/16/16 0001      AROM   Right Wrist Extension 82 Degrees   Right Wrist Flexion 78 Degrees   Right Wrist Radial Deviation 33 Degrees   Right Wrist Ulnar Deviation 33  Degrees     Strength   Right Hand Grip (lbs) 60   Right Hand Lateral Pinch 11 lbs   Right Hand 3 Point Pinch 12 lbs   Left Hand Grip (lbs) 65   Left Hand Lateral Pinch 15 lbs   Left Hand 3 Point Pinch 13 lbs       Measurements taken for discharge - see flow sheet - and PRHWE Reviewed HEP and setting up /changes for discharge HEP  PROM for wrist flexion and ext  can stop RD and UD   3 lbs for wrist flexion , ext , RD ,UD and sup/pro 12 reps all Stop prior to pain   Green putty for grip - can add 1/2 of dark blue to green to increase grip  Cont to do grip , pulling and twisting - and add lat grip  Fitted with Benik neoprene splint to use when starting back with tennis but only 30 min at time  Wall push ups done - no pain - 12 reps  Can do at home                      OT Education - 05/16/16 1906    Education provided Yes  Education Details discharge instructions   Person(s) Educated Patient   Methods Explanation;Demonstration;Tactile cues;Verbal cues;Handout   Comprehension Verbal cues required;Returned demonstration;Verbalized understanding          OT Short Term Goals - 05/16/16 1910      OT SHORT TERM GOAL #1   Title Pain on PRWHe improve with 10 points    Baseline 14/50 at eval on PRWHE and now 3/50   Status Achieved     OT SHORT TERM GOAL #2   Title Wrist AROM improve  by 5-15 degrees to push up from chair and hit some  tennisballs in gym   Baseline Wrist improved greatly - see flowsheet- but not played tennis yet   Status Partially Met           OT Long Term Goals - 05/16/16 1910      OT LONG TERM GOAL #1   Title Strength in grip and prehension improve for pt to carry 10 lbs , turn knobs at work , Librarian, 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    Status Achieved     OT LONG TERM GOAL #2   Title Pt to be ind in HEP to increase ROM and grip - return to prior level of function    Baseline not playing tennis yet   Status Achieved                Plan - 05/16/16 1907    Clinical Impression Statement Pt made great progres in pain , ROM , strength and use of R hand - pt met all goals and can cont at home with HEP    OT Treatment/Interventions Self-care/ADL training;Patient/family education;Fluidtherapy;Therapeutic exercises;Passive range of motion;Manual Therapy   Plan discharge instruction    OT Home Exercise Plan see pt instruction   Consulted and Agree with Plan of Care Patient      Patient will benefit from skilled therapeutic intervention in order to improve the following deficits and impairments:     Visit Diagnosis: Muscle weakness (generalized)  Pain in right wrist  Stiffness of right wrist, not elsewhere classified    Problem List There are no active problems to display for this patient.   Rosalyn Gess OTR/L,CLT 05/16/2016, 7:12 PM  [place] PHYSICAL AND SPORTS MEDICINE [address], [place], [address] Phone: [phone]   Fax:  [phone]  Name: Courtney Cochran MRN: [id number] Date of Birth: Feb 22, 1956

## 2016-09-14 ENCOUNTER — Other Ambulatory Visit: Payer: Self-pay | Admitting: Unknown Physician Specialty

## 2016-09-14 DIAGNOSIS — M79645 Pain in left finger(s): Secondary | ICD-10-CM

## 2016-09-14 DIAGNOSIS — S66902A Unspecified injury of unspecified muscle, fascia and tendon at wrist and hand level, left hand, initial encounter: Secondary | ICD-10-CM

## 2016-10-01 ENCOUNTER — Ambulatory Visit
Admission: RE | Admit: 2016-10-01 | Discharge: 2016-10-01 | Disposition: A | Discharge status: Home / Self Care | Payer: 59 | Source: Ambulatory Visit | Attending: Unknown Physician Specialty | Admitting: Unknown Physician Specialty

## 2016-10-01 DIAGNOSIS — M79645 Pain in left finger(s): Secondary | ICD-10-CM | POA: Insufficient documentation

## 2016-10-01 DIAGNOSIS — S66902A Unspecified injury of unspecified muscle, fascia and tendon at wrist and hand level, left hand, initial encounter: Secondary | ICD-10-CM

## 2016-10-01 DIAGNOSIS — M25442 Effusion, left hand: Secondary | ICD-10-CM | POA: Insufficient documentation

## 2016-10-15 ENCOUNTER — Other Ambulatory Visit: Payer: Self-pay | Admitting: Internal Medicine

## 2016-10-15 DIAGNOSIS — Z1231 Encounter for screening mammogram for malignant neoplasm of breast: Secondary | ICD-10-CM

## 2017-01-09 ENCOUNTER — Ambulatory Visit
Admission: RE | Admit: 2017-01-09 | Discharge: 2017-01-09 | Disposition: A | Discharge status: Home / Self Care | Payer: 59 | Source: Ambulatory Visit | Attending: Internal Medicine | Admitting: Internal Medicine

## 2017-01-09 DIAGNOSIS — Z1231 Encounter for screening mammogram for malignant neoplasm of breast: Secondary | ICD-10-CM | POA: Diagnosis present

## 2017-06-25 HISTORY — PX: COLONOSCOPY: SHX174

## 2017-06-30 IMAGING — DX DG WRIST COMPLETE 3+V*R*
4 series · 4 of 4 positions shown · non-contrast
Comparison: Earlier today

CLINICAL DATA: Postreduction right wrist.

EXAM:
RIGHT WRIST - COMPLETE 3+ VIEW

[wrist ap (1 of 2)]
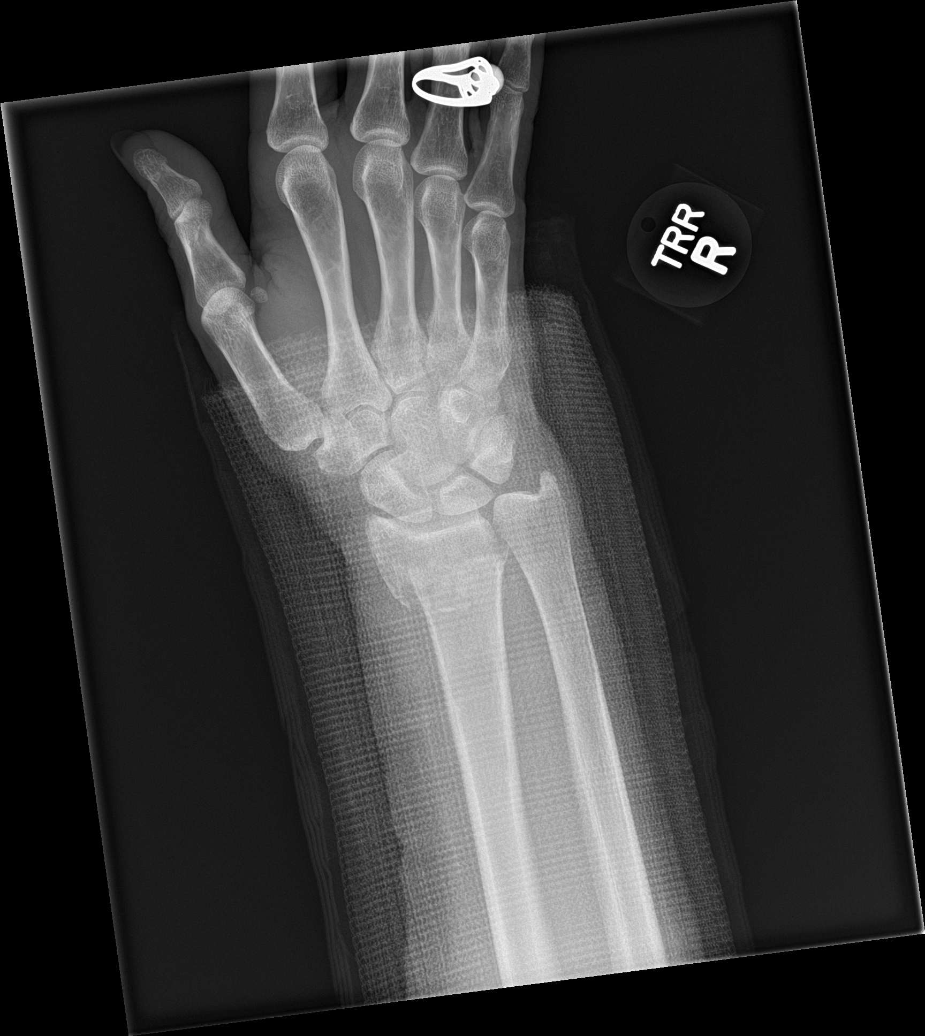

[wrist obl]
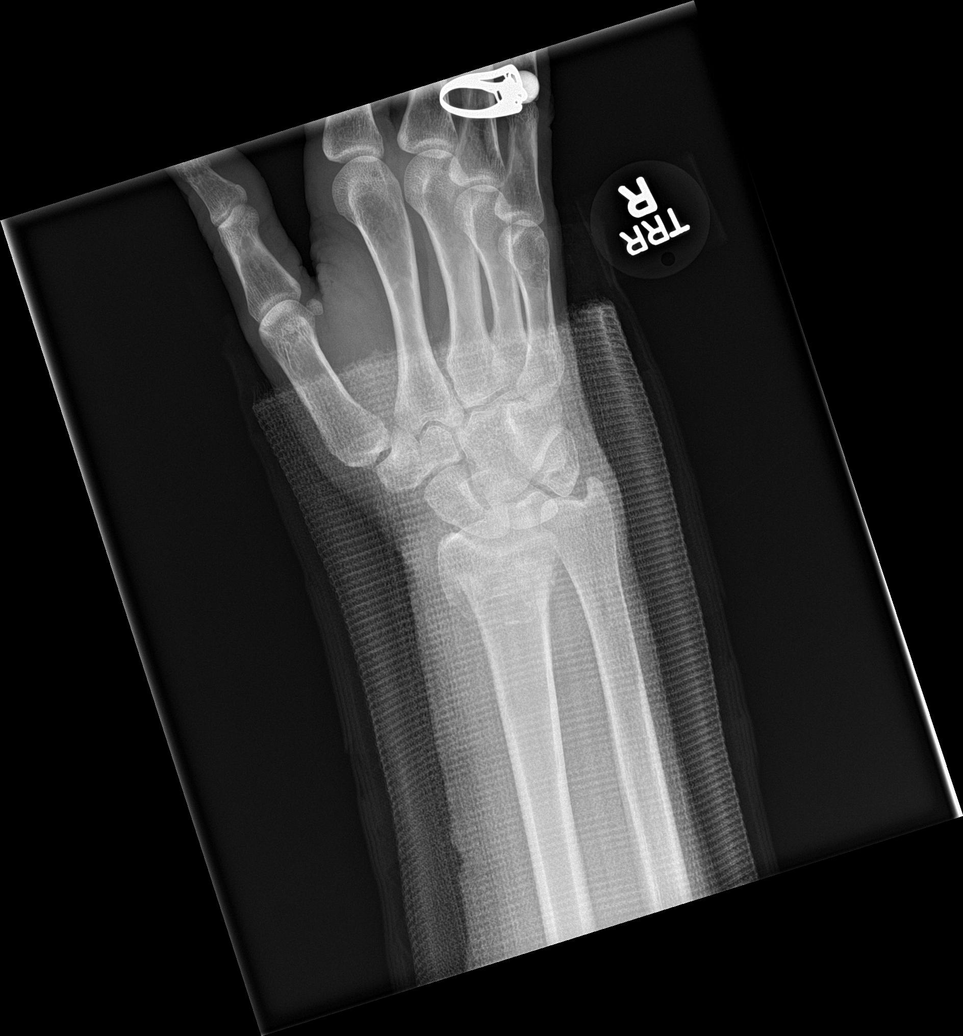

[wrist lat]
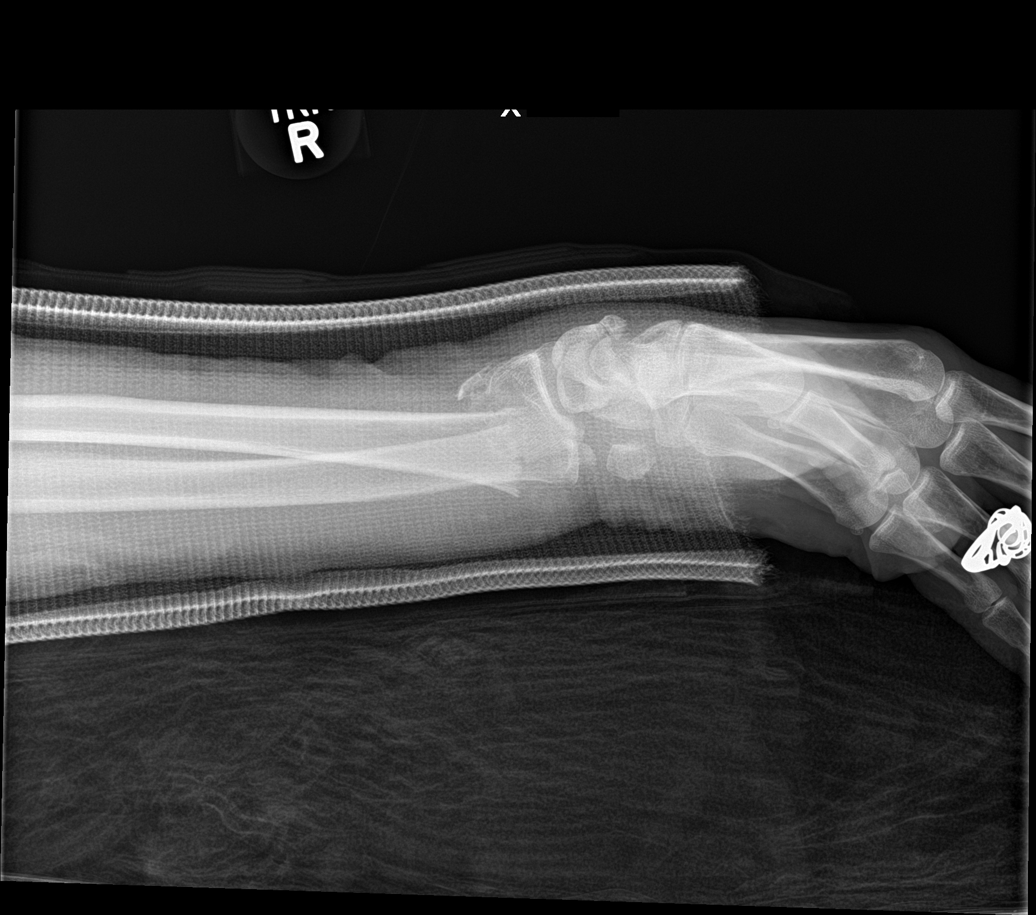

[wrist ap (2 of 2)]
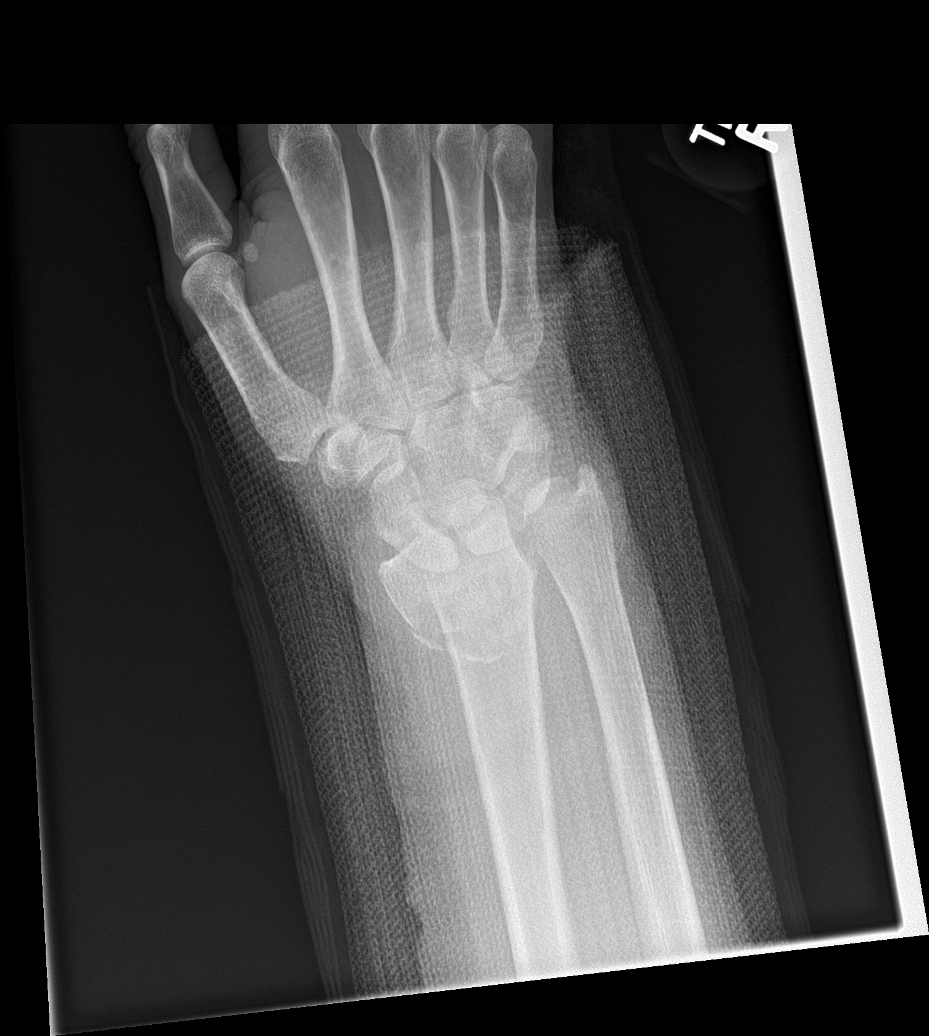

[4 of 4 positions shown; findings below may reference images not displayed]

FINDINGS: Initial encounter

Transverse distal radius fracture with persistent 50% posterior
displacement and dorsal wrist tilting. Ulnar positive variance is
mildly improved. Located radiocarpal joint.
IMPRESSION: Partially reduced distal radial metaphysis fracture with persistent
posterior displacement and dorsal wrist tilt.

## 2018-01-25 DIAGNOSIS — M7711 Lateral epicondylitis, right elbow: Secondary | ICD-10-CM | POA: Insufficient documentation

## 2018-03-25 ENCOUNTER — Other Ambulatory Visit: Payer: Self-pay | Admitting: Internal Medicine

## 2018-03-25 DIAGNOSIS — Z1231 Encounter for screening mammogram for malignant neoplasm of breast: Secondary | ICD-10-CM

## 2018-04-22 ENCOUNTER — Ambulatory Visit
Admission: RE | Admit: 2018-04-22 | Discharge: 2018-04-22 | Disposition: A | Discharge status: Home / Self Care | Payer: 59 | Source: Ambulatory Visit | Attending: Internal Medicine | Admitting: Internal Medicine

## 2018-04-22 DIAGNOSIS — Z1231 Encounter for screening mammogram for malignant neoplasm of breast: Secondary | ICD-10-CM | POA: Diagnosis present

## 2018-06-18 DIAGNOSIS — S46011A Strain of muscle(s) and tendon(s) of the rotator cuff of right shoulder, initial encounter: Secondary | ICD-10-CM | POA: Insufficient documentation

## 2018-06-25 ENCOUNTER — Other Ambulatory Visit: Payer: Self-pay | Admitting: Hand Surgery

## 2018-06-25 DIAGNOSIS — M7711 Lateral epicondylitis, right elbow: Secondary | ICD-10-CM

## 2018-06-25 DIAGNOSIS — S46011A Strain of muscle(s) and tendon(s) of the rotator cuff of right shoulder, initial encounter: Secondary | ICD-10-CM

## 2018-07-21 ENCOUNTER — Ambulatory Visit
Admission: RE | Admit: 2018-07-21 | Discharge: 2018-07-21 | Disposition: A | Discharge status: Home / Self Care | Payer: PRIVATE HEALTH INSURANCE | Source: Ambulatory Visit | Attending: Hand Surgery | Admitting: Hand Surgery

## 2018-07-21 DIAGNOSIS — S46011A Strain of muscle(s) and tendon(s) of the rotator cuff of right shoulder, initial encounter: Secondary | ICD-10-CM | POA: Diagnosis present

## 2018-07-21 DIAGNOSIS — M75101 Unspecified rotator cuff tear or rupture of right shoulder, not specified as traumatic: Secondary | ICD-10-CM | POA: Insufficient documentation

## 2018-07-21 DIAGNOSIS — M7711 Lateral epicondylitis, right elbow: Secondary | ICD-10-CM

## 2018-07-21 DIAGNOSIS — M19011 Primary osteoarthritis, right shoulder: Secondary | ICD-10-CM | POA: Diagnosis not present

## 2018-11-12 ENCOUNTER — Ambulatory Visit: Payer: PRIVATE HEALTH INSURANCE

## 2019-01-08 ENCOUNTER — Other Ambulatory Visit: Payer: Self-pay | Admitting: Internal Medicine

## 2019-01-08 DIAGNOSIS — R1032 Left lower quadrant pain: Secondary | ICD-10-CM

## 2019-01-20 ENCOUNTER — Ambulatory Visit
Admission: RE | Admit: 2019-01-20 | Discharge: 2019-01-20 | Disposition: A | Discharge status: Home / Self Care | Payer: PRIVATE HEALTH INSURANCE | Source: Ambulatory Visit | Attending: Internal Medicine | Admitting: Internal Medicine

## 2019-01-20 ENCOUNTER — Other Ambulatory Visit: Payer: Self-pay

## 2019-01-20 DIAGNOSIS — R1032 Left lower quadrant pain: Secondary | ICD-10-CM | POA: Insufficient documentation

## 2019-01-20 MED ORDER — IOHEXOL 300 MG/ML  SOLN
100.0000 mL | Freq: Once | INTRAMUSCULAR | Status: AC | PRN
  Administered 2019-01-20: 100 mL via INTRAVENOUS

## 2019-01-21 ENCOUNTER — Other Ambulatory Visit (HOSPITAL_COMMUNITY): Payer: Self-pay | Admitting: Internal Medicine

## 2019-01-21 ENCOUNTER — Other Ambulatory Visit: Payer: Self-pay | Admitting: Internal Medicine

## 2019-01-21 DIAGNOSIS — K5790 Diverticulosis of intestine, part unspecified, without perforation or abscess without bleeding: Secondary | ICD-10-CM | POA: Insufficient documentation

## 2019-01-21 DIAGNOSIS — Z87898 Personal history of other specified conditions: Secondary | ICD-10-CM | POA: Insufficient documentation

## 2019-01-21 DIAGNOSIS — R918 Other nonspecific abnormal finding of lung field: Secondary | ICD-10-CM

## 2019-02-02 ENCOUNTER — Ambulatory Visit: Admission: RE | Admit: 2019-02-02 | Payer: PRIVATE HEALTH INSURANCE | Source: Ambulatory Visit

## 2019-02-10 ENCOUNTER — Ambulatory Visit
Admission: RE | Admit: 2019-02-10 | Discharge: 2019-02-10 | Disposition: A | Discharge status: Home / Self Care | Payer: PRIVATE HEALTH INSURANCE | Source: Ambulatory Visit | Attending: Internal Medicine | Admitting: Internal Medicine

## 2019-02-10 ENCOUNTER — Other Ambulatory Visit: Payer: Self-pay

## 2019-02-10 DIAGNOSIS — R918 Other nonspecific abnormal finding of lung field: Secondary | ICD-10-CM | POA: Diagnosis present

## 2019-02-10 MED ORDER — IOHEXOL 300 MG/ML  SOLN
75.0000 mL | Freq: Once | INTRAMUSCULAR | Status: AC | PRN
  Administered 2019-02-10: 16:00:00 75 mL via INTRAVENOUS

## 2019-02-23 ENCOUNTER — Encounter: Payer: Self-pay | Admitting: Urology

## 2019-02-23 ENCOUNTER — Other Ambulatory Visit: Payer: Self-pay

## 2019-02-23 ENCOUNTER — Ambulatory Visit (INDEPENDENT_AMBULATORY_CARE_PROVIDER_SITE_OTHER): Payer: PRIVATE HEALTH INSURANCE | Admitting: Urology

## 2019-02-23 VITALS — BP 143/83 | HR 76 | Ht 68.0 in | Wt 154.0 lb

## 2019-02-23 DIAGNOSIS — N200 Calculus of kidney: Secondary | ICD-10-CM | POA: Diagnosis not present

## 2019-02-23 DIAGNOSIS — N390 Urinary tract infection, site not specified: Secondary | ICD-10-CM | POA: Diagnosis not present

## 2019-02-23 LAB — MICROSCOPIC EXAMINATION: WBC, UA: >30 /hpf — AB (ref 0–5)

## 2019-02-23 LAB — URINALYSIS, COMPLETE
Bilirubin, UA: NEGATIVE
Glucose, UA: NEGATIVE
Ketones, UA: NEGATIVE
Nitrite, UA: POSITIVE — AB
Specific Gravity, UA: 1.02 (ref 1.005–1.030)
Urobilinogen, Ur: 0.2 mg/dL (ref 0.2–1.0)
pH, UA: 6 (ref 5.0–7.5)

## 2019-02-23 MED ORDER — SULFAMETHOXAZOLE-TRIMETHOPRIM 800-160 MG PO TABS: 1.0000 | ORAL_TABLET | Freq: Two times a day (BID) | ORAL | 0 refills | Status: DC

## 2019-02-23 NOTE — Progress Notes (Signed)
02/23/19 9:48 AM   Courtney Cochran [id number] [id number]  Referring provider: Adin Hector, MD [place],  [place] [address]  CC: Nephrolithiasis  HPI: I saw Ms. Courtney Cochran in urology clinic today in consultation for nephrolithiasis from Dr. Caryl Comes.  She is a 63 year old female with history of nephrolithiasis who was previously followed by Dr. Jacqlyn Larsen.  She has a history of lithotripsy twice, as well as 2 other spontaneously passed stones.  Recent CT performed 01/20/2019 for abdominal pain showed no obstructing stones, however there was a 9 mm left lower pole stone and some other scattered punctate stones.  There was no significant right-sided stone burden.  No ureteral stones were seen.  She continues to have some pelvic and low back pain.  Her urinalysis in clinic today is grossly concerning for infection with greater than 30 WBCs, 3-10 RBCs, many bacteria, nitrite positive.  She denies any fevers, gross hematuria, urgency, frequency, or dysuria.  There are no aggravating or alleviating factors.  Severity is moderate.   PMH: Past Medical History:  Diagnosis Date  . Anemia    H/O 18 YEARS AGO  . Chronic kidney disease    STONES  . Family history of adverse reaction to anesthesia    PTS SISTER HAS WOKEN UP TWICE DURING SURGERY  . Foot injury    LEFT-FOOT BRUISED AND SWOLLEN PER PT(RELATED TO HER FALL THAT BROKE HER WRIST)  . GERD (gastroesophageal reflux disease)    RARE  . Osteopenia     Surgical History: Past Surgical History:  Procedure Laterality Date  . COLONOSCOPY    . DILATION AND CURETTAGE OF UTERUS    . KIDNEY STONE SURGERY    . OPEN REDUCTION INTERNAL FIXATION (ORIF) DISTAL RADIAL FRACTURE Right 02/22/2016   Procedure: OPEN REDUCTION INTERNAL FIXATION (ORIF) DISTAL RADIAL FRACTURE;  Surgeon: Corky Mull, MD;  Location: [facility];  Service: Orthopedics;  Laterality: Right;    Allergies: No Known Allergies   Family History: Family History  Problem Relation Age of Onset  . Breast cancer Neg Hx     Social History:  reports that she quit smoking about 43 years ago. Her smoking use included cigarettes. She has a 4.00 pack-year smoking history. She has never used smokeless tobacco. She reports current alcohol use. She reports that she does not use drugs.  ROS: Please see flowsheet from today's date for complete review of systems.  Physical Exam: BP (!) 143/83 (BP Location: Left Arm, Patient Position: Sitting)   Pulse 76   Ht 5\' 8"  (1.727 m)   Wt 154 lb (69.9 kg)   BMI 23.42 kg/m    Constitutional:  Alert and oriented, No acute distress. Cardiovascular: No clubbing, cyanosis, or edema. Respiratory: Normal respiratory effort, no increased work of breathing. GI: Abdomen is soft, nontender, nondistended, no abdominal masses GU: No CVA tenderness Lymph: No cervical or inguinal lymphadenopathy. Skin: No rashes, bruises or suspicious lesions. Neurologic: Grossly intact, no focal deficits, moving all 4 extremities. Psychiatric: Normal mood and affect.  Laboratory Data: Reviewed, see HPI  Pertinent Imaging: I have personally reviewed the CT abdomen pelvis from 12/2018, see HPI.  Assessment & Plan:   In summary, the patient is a healthy 63 year old female with history of nephrolithiasis requiring lithotripsy x2, as well as 2 spontaneously passed stones.  She has had some low back pain and pelvic pain and CT shows nonobstructing stones bilaterally, with greatest burden on the left side.  She  does not wish to pursue any intervention for her nonobstructing stones.  We discussed the risks and benefits at length.  She does appear to have an active UTI today which may be the source of her pelvic and low back pain.  We discussed general stone prevention strategies including adequate hydration with goal of producing 2.5 L of urine daily, increasing citric acid intake, increasing calcium intake during high  oxalate meals, minimizing animal protein, and decreasing salt intake. Information about dietary recommendations given today.   Send urine for culture Bactrim DS x3 days for UTI RTC 1 year with KUB prior for stone surveillance  Sondra ComeBrian C Sninsky, MD  [facility] [address], [address] [place], [place] [address] [phone]

## 2019-02-23 NOTE — Addendum Note (Signed)
Addended by: Feliberto Gottron on: 02/23/2019 09:58 AM   Modules accepted: Orders

## 2019-02-25 LAB — CULTURE, URINE COMPREHENSIVE

## 2019-03-10 ENCOUNTER — Other Ambulatory Visit: Payer: Self-pay | Admitting: Internal Medicine

## 2019-03-10 DIAGNOSIS — Z1231 Encounter for screening mammogram for malignant neoplasm of breast: Secondary | ICD-10-CM

## 2019-03-16 ENCOUNTER — Ambulatory Visit: Payer: Self-pay | Admitting: Urology

## 2019-04-27 ENCOUNTER — Ambulatory Visit
Admission: RE | Admit: 2019-04-27 | Discharge: 2019-04-27 | Disposition: A | Discharge status: Home / Self Care | Payer: PRIVATE HEALTH INSURANCE | Source: Ambulatory Visit | Attending: Internal Medicine | Admitting: Internal Medicine

## 2019-04-27 DIAGNOSIS — Z1231 Encounter for screening mammogram for malignant neoplasm of breast: Secondary | ICD-10-CM | POA: Insufficient documentation

## 2019-06-18 ENCOUNTER — Other Ambulatory Visit: Payer: Self-pay

## 2019-06-18 ENCOUNTER — Emergency Department
Admission: EM | Admit: 2019-06-18 | Discharge: 2019-06-18 | Disposition: A | Discharge status: Home / Self Care | Payer: PRIVATE HEALTH INSURANCE | Attending: Student | Admitting: Student

## 2019-06-18 ENCOUNTER — Emergency Department: Payer: PRIVATE HEALTH INSURANCE

## 2019-06-18 DIAGNOSIS — Z87891 Personal history of nicotine dependence: Secondary | ICD-10-CM | POA: Insufficient documentation

## 2019-06-18 DIAGNOSIS — N201 Calculus of ureter: Secondary | ICD-10-CM | POA: Diagnosis not present

## 2019-06-18 DIAGNOSIS — Z79899 Other long term (current) drug therapy: Secondary | ICD-10-CM | POA: Insufficient documentation

## 2019-06-18 DIAGNOSIS — N390 Urinary tract infection, site not specified: Secondary | ICD-10-CM | POA: Diagnosis not present

## 2019-06-18 DIAGNOSIS — R109 Unspecified abdominal pain: Secondary | ICD-10-CM

## 2019-06-18 LAB — BASIC METABOLIC PANEL
Anion gap: 10 (ref 5–15)
BUN: 21 mg/dL (ref 8–23)
CO2: 27 mmol/L (ref 22–32)
Calcium: 9.2 mg/dL (ref 8.9–10.3)
Chloride: 105 mmol/L (ref 98–111)
Creatinine, Ser: 0.96 mg/dL (ref 0.44–1.00)
GFR calc Af Amer: >60 mL/min (ref >60)
GFR calc non Af Amer: >60 mL/min (ref >60)
Glucose, Bld: 126 mg/dL — ABNORMAL HIGH (ref 70–99)
Potassium: 3.9 mmol/L (ref 3.5–5.1)
Sodium: 142 mmol/L (ref 135–145)

## 2019-06-18 LAB — CBC
HCT: 42.7 % (ref 36.0–46.0)
Hemoglobin: 14.2 g/dL (ref 12.0–15.0)
MCH: 29.9 pg (ref 26.0–34.0)
MCHC: 33.3 g/dL (ref 30.0–36.0)
MCV: 89.9 fL (ref 80.0–100.0)
Platelets: 263 10*3/uL (ref 150–400)
RBC: 4.75 MIL/uL (ref 3.87–5.11)
RDW: 12.6 % (ref 11.5–15.5)
WBC: 6.4 10*3/uL (ref 4.0–10.5)
nRBC: 0 % (ref 0.0–0.2)

## 2019-06-18 LAB — URINALYSIS, COMPLETE (UACMP) WITH MICROSCOPIC
Bilirubin Urine: NEGATIVE
Glucose, UA: NEGATIVE mg/dL
Ketones, ur: NEGATIVE mg/dL
Nitrite: POSITIVE — AB
Protein, ur: 30 mg/dL — AB
Specific Gravity, Urine: 1.014 (ref 1.005–1.030)
pH: 6 (ref 5.0–8.0)

## 2019-06-18 MED ORDER — ONDANSETRON HCL 4 MG/2ML IJ SOLN
4.0000 mg | Freq: Once | INTRAMUSCULAR | Status: AC
  Administered 2019-06-18: 4 mg via INTRAVENOUS
  Filled 2019-06-18: qty 2

## 2019-06-18 MED ORDER — OXYCODONE HCL 5 MG PO TABS: 5.0000 mg | ORAL_TABLET | Freq: Four times a day (QID) | ORAL | 0 refills | Status: AC | PRN

## 2019-06-18 MED ORDER — KETOROLAC TROMETHAMINE 30 MG/ML IJ SOLN
INTRAMUSCULAR | Status: AC
  Filled 2019-06-18: qty 1

## 2019-06-18 MED ORDER — KETOROLAC TROMETHAMINE 30 MG/ML IJ SOLN
15.0000 mg | Freq: Once | INTRAMUSCULAR | Status: AC
  Administered 2019-06-18: 15 mg via INTRAVENOUS

## 2019-06-18 MED ORDER — PROMETHAZINE HCL 25 MG/ML IJ SOLN
12.5000 mg | Freq: Once | INTRAMUSCULAR | Status: AC
  Administered 2019-06-18: 12.5 mg via INTRAVENOUS
  Filled 2019-06-18: qty 1

## 2019-06-18 MED ORDER — ONDANSETRON HCL 4 MG/2ML IJ SOLN
4.0000 mg | Freq: Once | INTRAMUSCULAR | Status: AC
  Administered 2019-06-18: 09:00:00 4 mg via INTRAVENOUS
  Filled 2019-06-18: qty 2

## 2019-06-18 MED ORDER — CEFDINIR 300 MG PO CAPS: 300.0000 mg | ORAL_CAPSULE | Freq: Two times a day (BID) | ORAL | 0 refills | Status: AC

## 2019-06-18 MED ORDER — SODIUM CHLORIDE 0.9 % IV SOLN
1.0000 g | Freq: Once | INTRAVENOUS | Status: AC
  Administered 2019-06-18: 1 g via INTRAVENOUS
  Filled 2019-06-18: qty 10

## 2019-06-18 MED ORDER — FENTANYL CITRATE (PF) 100 MCG/2ML IJ SOLN
50.0000 ug | INTRAMUSCULAR | Status: AC | PRN
  Administered 2019-06-18 (×2): 50 ug via INTRAVENOUS
  Filled 2019-06-18 (×2): qty 2

## 2019-06-18 MED ORDER — TAMSULOSIN HCL 0.4 MG PO CAPS: 0.4000 mg | ORAL_CAPSULE | Freq: Every day | ORAL | 0 refills | Status: AC

## 2019-06-18 NOTE — ED Triage Notes (Signed)
Pt c/o left flank pain with N/V since 730am, states she has a hx of kidney stones and this feels the same, states she took an oxycodone 48min PTA. Pt is rocking back and fourth in the chair.

## 2019-06-18 NOTE — ED Provider Notes (Signed)
[facility] Emergency Department Provider Note  ____________________________________________   First MD Initiated Contact with Patient 06/18/19 [phone]     (approximate)  I have reviewed the triage vital signs and the nursing notes.  History  Chief Complaint Flank Pain    HPI Courtney Cochran is a 63 y.o. female with a history of nephrolithiasis who presents to the emergency department with concern for recurrent kidney stone.  Patient states she woke up this morning with constant, left-sided flank pain.  Pain is 8/10 in severity, sharp.  No significant radiation.  Improved with fentanyl in the emergency department.  Associated with nausea and vomiting.  Patient states symptoms feel similar to prior episodes of nephrolithiasis.  She believes her last episode was approximately 2 years ago.  She has required lithotripsy in the past.  She denies any fevers, hematuria, dysuria, or malodorous urine.   Past Medical Hx Past Medical History:  Diagnosis Date  . Anemia    H/O 18 YEARS AGO  . Chronic kidney disease    STONES  . Family history of adverse reaction to anesthesia    PTS SISTER HAS WOKEN UP TWICE DURING SURGERY  . Foot injury    LEFT-FOOT BRUISED AND SWOLLEN PER PT(RELATED TO HER FALL THAT BROKE HER WRIST)  . GERD (gastroesophageal reflux disease)    RARE  . Osteopenia     Problem List There are no active problems to display for this patient.   Past Surgical Hx Past Surgical History:  Procedure Laterality Date  . COLONOSCOPY    . DILATION AND CURETTAGE OF UTERUS    . KIDNEY STONE SURGERY    . OPEN REDUCTION INTERNAL FIXATION (ORIF) DISTAL RADIAL FRACTURE Right 02/22/2016   Procedure: OPEN REDUCTION INTERNAL FIXATION (ORIF) DISTAL RADIAL FRACTURE;  Surgeon: Corky Mull, MD;  Location: [facility];  Service: Orthopedics;  Laterality: Right;    Medications Prior to Admission medications   Medication Sig Start Date End Date Taking?  Authorizing Provider  acetaminophen (TYLENOL) 325 MG tablet Take 650 mg by mouth every 6 (six) hours as needed.    [provider]  BIOTIN PO Take by mouth.    [provider]  buPROPion (WELLBUTRIN XL) 300 MG 24 hr tablet TK 1 T PO QD 09/08/18   [provider]  calcium carbonate (TUMS - DOSED IN MG ELEMENTAL CALCIUM) 500 MG chewable tablet Chew 1 tablet by mouth as needed for indigestion or heartburn.    [provider]  cholecalciferol (VITAMIN D) 1000 units tablet Take 5,000 Units by mouth daily.    [provider]  CORAL CALCIUM PO Take 1 tablet by mouth daily.    [provider]  ibuprofen (ADVIL,MOTRIN) 200 MG tablet Take 200 mg by mouth every 6 (six) hours as needed.    [provider]  MAGNESIUM GLUCONATE PO Take by mouth.    [provider]  Multiple Vitamin (MULTI-VITAMIN) tablet Take by mouth.    [provider]  Oil of Oregano 1500 MG CAPS Take by mouth.    [provider]  Omega-3 Fatty Acids (FISH OIL) 1000 MG CAPS Take by mouth.    [provider]  Omega-3 Fatty Acids (OMEGA 3 PO) Take 1,280 mg by mouth 2 (two) times daily. Reported on 02/17/2016    [provider]  oxyCODONE (ROXICODONE) 5 MG immediate release tablet Take 1-2 tablets (5-10 mg total) by mouth every 4 (four) hours as needed for severe pain. Patient  not taking: Reported on 02/23/2019 02/22/16   Poggi, Excell SeltzerJohn J, MD  phentermine (ADIPEX-P) 37.5 MG tablet Take 37.5 mg by mouth daily. 12/23/15   [provider]  sulfamethoxazole-trimethoprim (BACTRIM DS) 800-160 MG tablet Take 1 tablet by mouth 2 (two) times daily. 02/23/19   Sondra ComeSninsky, Brian C, MD  tamsulosin (FLOMAX) 0.4 MG CAPS capsule Take by mouth. 09/22/14   [provider]  TURMERIC PO Take by mouth.    [provider]  valACYclovir (VALTREX) 500 MG tablet Take by mouth. 06/13/16   [provider]    Allergies Patient has no  known allergies.  Family Hx Family History  Problem Relation Age of Onset  . Breast cancer Neg Hx     Social Hx Social History   Tobacco Use  . Smoking status: Former Smoker    Packs/day: 1.00    Years: 4.00    Pack years: 4.00    Types: Cigarettes    Quit date: 02/20/1976    Years since quitting: 43.3  . Smokeless tobacco: Never Used  Substance Use Topics  . Alcohol use: Yes    Comment: RARE-WINE  . Drug use: No     Review of Systems  Constitutional: Negative for fever, chills. Eyes: Negative for visual changes. ENT: Negative for sore throat. Cardiovascular: Negative for chest pain. Respiratory: Negative for shortness of breath. Gastrointestinal: Negative for nausea, vomiting.  Genitourinary: Negative for dysuria. + L flank pain Musculoskeletal: Negative for leg swelling. Skin: Negative for rash. Neurological: Negative for for headaches.   Physical Exam  Vital Signs: ED Triage Vitals  Enc Vitals Group     BP 06/18/19 0911 (!) 158/71     Pulse Rate 06/18/19 0911 62     Resp 06/18/19 0911 18     Temp 06/18/19 0914 98.6 F (37 C)     Temp src --      SpO2 06/18/19 0911 100 %     Weight 06/18/19 0914 152 lb (68.9 kg)     Height 06/18/19 0914 5\' 8"  (1.727 m)     Head Circumference --      Peak Flow --      Pain Score 06/18/19 0914 8     Pain Loc --      Pain Edu? --      Excl. in GC? --     Constitutional: Alert and oriented.  Head: Normocephalic. Atraumatic. Eyes: Conjunctivae clear. Sclera anicteric. Nose: No congestion. No rhinorrhea. Mouth/Throat: Wearing mask.  Neck: No stridor.   Cardiovascular: Normal rate, regular rhythm. Extremities well perfused. Respiratory: Normal respiratory effort.  Lungs CTAB. Gastrointestinal: Soft. Non-tender. Non-distended.  Mild left-sided CVA discomfort with palpation. Musculoskeletal: No lower extremity edema. No deformities. Neurologic:  Normal speech and language. No gross focal neurologic deficits are  appreciated.  Skin: Skin is warm, dry and intact. No rash noted. Psychiatric: Mood and affect are appropriate for situation.  EKG  N/A   Radiology  CT: IMPRESSION: 1. 6 x 6 mm calculus in the left ureter at the L4 level with severe hydronephrosis on the left. 2. Multiple calculi throughout each kidney, more numerous and larger on the left than on the right. 3. Extensive descending colonic and sigmoid colon diverticulosis without diverticulitis. No bowel obstruction. No abscess in the abdomen or pelvis. Appendix appears normal. 4.  Stable nodular opacities in the left base region. 5.  Leiomyomatous uterus. 6.  Aortic Atherosclerosis (ICD10-I70.0).   Procedures  Procedure(s) performed (including critical care):  Procedures  Initial Impression / Assessment and Plan / ED Course  63 y.o. female who presents to the ED for left-sided flank pain, nausea, vomiting, similar to prior episodes of nephrolithiasis.  Work-up reveals a 6 x 6 mm stone in the left ureter with associated hydronephrosis.  Creatinine is normal.  UA is suggestive of infection, with LE, nitrites, WBC, bacteria.  Urine culture is sent.  She had some mild left-sided flank discomfort with palpation, but she is afebrile and does not have a leukocytosis, therefore do not suspect pyelonephritis at this time.  Discussed case with urology.  Given she is afebrile, normal WBC count, and pain and nausea have improved in the emergency department, patient is appropriate for discharge with course of oral antibiotics and pain control, with follow-up in the clinic for possible lithotripsy/stenting if needed.  Discussed extremely strict return precautions, including the development of fever, worsening pain, nausea/vomiting, or inability to tolerate her medications.  She voices clear understanding of this.  As such, will plan for discharge, given Rx for pain control, antibiotics, and Flomax.    Final Clinical Impression(s) / ED  Diagnosis  Final diagnoses:  Left flank pain  Ureterolithiasis  Urinary tract infection in female       Note:  This document was prepared using Dragon voice recognition software and may include unintentional dictation errors.   Miguel Aschoff., MD 06/18/19 1228

## 2019-06-18 NOTE — Discharge Instructions (Addendum)
Thank you for letting us take care of you in the emergency department today.   Please continue to take any regular, prescribed medications.   New medications we have prescribed:  - Omnicef, anitbiotic - Oxycodone - try using over-the-counter Tylenol and ibuprofen first, if you cannot control your pain with these use the oxycodone as directed - Flomax, take at nighttime, be careful when changing positions as we discussed that sometimes you can get lightheaded from this medication  Please follow up with: - Urology - call number below to schedule an appointment - Your primary care doctor to review your ER visit and follow up on your symptoms.   Please return to the ER for any new or worsening symptoms, especially if you develop fevers, worsening pain, nausea, vomiting, inability to take your medications.

## 2019-06-18 NOTE — ED Notes (Signed)
Pt back from CT

## 2019-06-18 NOTE — ED Notes (Signed)
PT reports pain that feels like a kidney stone on the left side. Pt reports hx of the same.

## 2019-06-20 LAB — URINE CULTURE: Culture: >=100000 — AB

## 2019-06-22 ENCOUNTER — Ambulatory Visit
Admission: RE | Admit: 2019-06-22 | Discharge: 2019-06-22 | Disposition: A | Discharge status: Home / Self Care | Payer: PRIVATE HEALTH INSURANCE | Source: Ambulatory Visit | Attending: Urology | Admitting: Urology

## 2019-06-22 ENCOUNTER — Encounter: Payer: Self-pay | Admitting: Urology

## 2019-06-22 ENCOUNTER — Other Ambulatory Visit: Payer: Self-pay

## 2019-06-22 ENCOUNTER — Ambulatory Visit (INDEPENDENT_AMBULATORY_CARE_PROVIDER_SITE_OTHER): Payer: PRIVATE HEALTH INSURANCE | Admitting: Urology

## 2019-06-22 VITALS — BP 145/81 | HR 86

## 2019-06-22 DIAGNOSIS — N200 Calculus of kidney: Secondary | ICD-10-CM | POA: Insufficient documentation

## 2019-06-22 DIAGNOSIS — N390 Urinary tract infection, site not specified: Secondary | ICD-10-CM | POA: Diagnosis not present

## 2019-06-22 LAB — URINALYSIS, COMPLETE
Bilirubin, UA: NEGATIVE
Glucose, UA: NEGATIVE
Nitrite, UA: NEGATIVE
Specific Gravity, UA: 1.025 (ref 1.005–1.030)
Urobilinogen, Ur: 0.2 mg/dL (ref 0.2–1.0)
pH, UA: 6 (ref 5.0–7.5)

## 2019-06-22 LAB — MICROSCOPIC EXAMINATION

## 2019-06-22 MED ORDER — SULFAMETHOXAZOLE-TRIMETHOPRIM 800-160 MG PO TABS: 1.0000 | ORAL_TABLET | Freq: Two times a day (BID) | ORAL | 0 refills | Status: DC

## 2019-06-22 NOTE — Progress Notes (Addendum)
   06/22/2019 6:42 PM   Darlyne Russian 24-Sep-1955 [id number]  Reason for visit: Left ureteral stone  HPI: I saw Ms. Groene in urology clinic today after being seen in the ER on 06/18/2019 for left-sided flank pain, nausea, and vomiting.  CT showed a 6 mm left proximal ureteral stone with severe hydronephrosis and multiple large calculi in the left kidney.  She also was diagnosed with an E. coli UTI and discharged on antibiotics.  She denies any fevers, chills, dysuria, or flank pain.  She thinks she passed her stone and her left-sided pain is completely resolved.  She is still having some urinary frequency.  She denies any gross hematuria.  She is having some diarrhea from the cefdinir she was given in the ED.  She was not evaluated by urology at that visit.  Urinalysis today shows 11-30 WBCs, 3-10 RBCs, few bacteria, nitrite negative.   ROS: Please see flowsheet from today's date for complete review of systems.  Physical Exam: BP (!) 145/81   Pulse 86    We a long conversation about her symptoms and CT findings today.  We also discussed ureteral stones in the setting of UTI, and that typically these require urgent stenting in patients can get sick acutely.  She is afebrile and hemodynamically stable today, will with no flank pain, and relatively benign urine.  I do not think she warrants urgent ureteral stent placement at this time.  I recommended a KUB to evaluate for persistent ureteral stone, or distal migration that would explain her urinary frequency but resolved pain.  With a number of stone she has on the left side, she does have a persistent ureteral stone, I would strongly recommend ureteroscopy, laser lithotripsy, and stent placement to manage her entire left-sided stone burden.  -We will call with KUB results -Cefdinir changed to Bactrim DS twice daily x10 days -Discussed at length return precautions including recurrence of pain, fevers/chills, or worsening UTI  symptoms  A total of 25 minutes were spent face-to-face with the patient, greater than 50% was spent in patient education, counseling, and coordination of care regarding nephrolithiasis and stone treatment options.  ADDENDUM: KUB 11/23 shows migration of 58mm left proximal stone to the distal ureter. She denies any pain today over the phone, and thinks she may have passed her stone last night when she thought she heard a stone when urinating. She did not strain the urine though. She continues to deny any fevers or pain. We discussed need for close follow up, and return precautions again.   RTC 2 weeks KUB prior, likely pursue left URS/LL/stent for large left sided stone burden regardless of if ureteral stone passed  Billey Co, [place] [address] [place], [place] [address] [phone]

## 2019-06-23 ENCOUNTER — Telehealth: Payer: Self-pay | Admitting: Urology

## 2019-06-23 NOTE — Telephone Encounter (Signed)
-----   Message from Billey Co, MD sent at 06/23/2019  8:56 AM EST ----- Regarding: follow up Please set up follow up with me in 2 weeks with KUB prior, ok to overbook, thanks  Nickolas Madrid, MD 06/23/2019

## 2019-06-23 NOTE — Telephone Encounter (Signed)
Done

## 2019-07-09 ENCOUNTER — Ambulatory Visit
Admission: RE | Admit: 2019-07-09 | Discharge: 2019-07-09 | Disposition: A | Discharge status: Home / Self Care | Payer: PRIVATE HEALTH INSURANCE | Source: Ambulatory Visit | Attending: Urology | Admitting: Urology

## 2019-07-09 ENCOUNTER — Other Ambulatory Visit: Payer: Self-pay

## 2019-07-09 ENCOUNTER — Ambulatory Visit (INDEPENDENT_AMBULATORY_CARE_PROVIDER_SITE_OTHER): Payer: PRIVATE HEALTH INSURANCE | Admitting: Urology

## 2019-07-09 ENCOUNTER — Encounter: Payer: Self-pay | Admitting: Urology

## 2019-07-09 VITALS — BP 155/92 | HR 82 | Ht 68.0 in | Wt 153.0 lb

## 2019-07-09 DIAGNOSIS — N200 Calculus of kidney: Secondary | ICD-10-CM

## 2019-07-09 NOTE — Patient Instructions (Signed)
Dietary Guidelines to Help Prevent Kidney Stones Kidney stones are deposits of minerals and salts that form inside your kidneys. Your risk of developing kidney stones may be greater depending on your diet, your lifestyle, the medicines you take, and whether you have certain medical conditions. Most people can reduce their chances of developing kidney stones by following the instructions below. Depending on your overall health and the type of kidney stones you tend to develop, your dietitian may give you more specific instructions. What are tips for following this plan? Reading food labels  Choose foods with "no salt added" or "low-salt" labels. Limit your sodium intake to less than 1500 mg per day.  Choose foods with calcium for each meal and snack. Try to eat about 300 mg of calcium at each meal. Foods that contain 200-500 mg of calcium per serving include: ? 8 oz (237 ml) of milk, fortified nondairy milk, and fortified fruit juice. ? 8 oz (237 ml) of kefir, yogurt, and soy yogurt. ? 4 oz (118 ml) of tofu. ? 1 oz of cheese. ? 1 cup (300 g) of dried figs. ? 1 cup (91 g) of cooked broccoli. ? 1-3 oz can of sardines or mackerel.  Most people need 1000 to 1500 mg of calcium each day. Talk to your dietitian about how much calcium is recommended for you. Shopping  Buy plenty of fresh fruits and vegetables. Most people do not need to avoid fruits and vegetables, even if they contain nutrients that may contribute to kidney stones.  When shopping for convenience foods, choose: ? Whole pieces of fruit. ? Premade salads with dressing on the side. ? Low-fat fruit and yogurt smoothies.  Avoid buying frozen meals or prepared deli foods.  Look for foods with live cultures, such as yogurt and kefir. Cooking  Do not add salt to food when cooking. Place a salt shaker on the table and allow each person to add his or her own salt to taste.  Use vegetable protein, such as beans, textured vegetable  protein (TVP), or tofu instead of meat in pasta, casseroles, and soups. Meal planning   Eat less salt, if told by your dietitian. To do this: ? Avoid eating processed or premade food. ? Avoid eating fast food.  Eat less animal protein, including cheese, meat, poultry, or fish, if told by your dietitian. To do this: ? Limit the number of times you have meat, poultry, fish, or cheese each week. Eat a diet free of meat at least 2 days a week. ? Eat only one serving each day of meat, poultry, fish, or seafood. ? When you prepare animal protein, cut pieces into small portion sizes. For most meat and fish, one serving is about the size of one deck of cards.  Eat at least 5 servings of fresh fruits and vegetables each day. To do this: ? Keep fruits and vegetables on hand for snacks. ? Eat 1 piece of fruit or a handful of berries with breakfast. ? Have a salad and fruit at lunch. ? Have two kinds of vegetables at dinner.  Limit foods that are high in a substance called oxalate. These include: ? Spinach. ? Rhubarb. ? Beets. ? Potato chips and french fries. ? Nuts.  If you regularly take a diuretic medicine, make sure to eat at least 1-2 fruits or vegetables high in potassium each day. These include: ? Avocado. ? Banana. ? Orange, prune, carrot, or tomato juice. ? Baked potato. ? Cabbage. ? Beans and split   peas. General instructions   Drink enough fluid to keep your urine clear or pale yellow. This is the most important thing you can do.  Talk to your health care provider and dietitian about taking daily supplements. Depending on your health and the cause of your kidney stones, you may be advised: ? Not to take supplements with vitamin C. ? To take a calcium supplement. ? To take a daily probiotic supplement. ? To take other supplements such as magnesium, fish oil, or vitamin B6.  Take all medicines and supplements as told by your health care provider.  Limit alcohol intake to no  more than 1 drink a day for nonpregnant women and 2 drinks a day for men. One drink equals 12 oz of beer, 5 oz of wine, or 1 oz of hard liquor.  Lose weight if told by your health care provider. Work with your dietitian to find strategies and an eating plan that works best for you. What foods are not recommended? Limit your intake of the following foods, or as told by your dietitian. Talk to your dietitian about specific foods you should avoid based on the type of kidney stones and your overall health. Grains Breads. Bagels. Rolls. Baked goods. Salted crackers. Cereal. Pasta. Vegetables Spinach. Rhubarb. Beets. Canned vegetables. Pickles. Olives. Meats and other protein foods Nuts. Nut butters. Large portions of meat, poultry, or fish. Salted or cured meats. Deli meats. Hot dogs. Sausages. Dairy Cheese. Beverages Regular soft drinks. Regular vegetable juice. Seasonings and other foods Seasoning blends with salt. Salad dressings. Canned soups. Soy sauce. Ketchup. Barbecue sauce. Canned pasta sauce. Casseroles. Pizza. Lasagna. Frozen meals. Potato chips. French fries. Summary  You can reduce your risk of kidney stones by making changes to your diet.  The most important thing you can do is drink enough fluid. You should drink enough fluid to keep your urine clear or pale yellow.  Ask your health care provider or dietitian how much protein from animal sources you should eat each day, and also how much salt and calcium you should have each day. This information is not intended to replace advice given to you by your health care provider. Make sure you discuss any questions you have with your health care provider. Document Released: 11/10/2010 Document Revised: 11/05/2018 Document Reviewed: 06/26/2016 Elsevier Patient Education  2020 [employer]  

## 2019-07-09 NOTE — Progress Notes (Signed)
   07/09/2019 7:39 PM   Darlyne Russian 1955-12-20 [id number]  Reason for visit: Follow up nephrolithiasis  HPI: I saw Ms. Proto back in urology clinic for follow-up of a left ureteral stone.  She is a healthy 63 year old female with a history of recurrent nephrolithiasis that was seen in the ER on 06/18/2019 for a 6 mm left proximal ureteral stone.  KUB at our visit on 11/23 showed migration of the stone to the distal ureter, and she elected medical expulsive therapy.  She passed her stone and brought it with her today.  She denies any left or right-sided flank or groin pain since passing her stone.  KUB today shows interval passage of the left distal ureteral stone.  There is a left proximal calcification but this appears to be within the kidney, she is completely asymptomatic.  We discussed general stone prevention strategies including adequate hydration with goal of producing 2.5 L of urine daily, increasing citric acid intake, increasing calcium intake during high oxalate meals, minimizing animal protein, and decreasing salt intake. Information about dietary recommendations given today.   We discussed at length the effect of the Covid pandemic on the healthcare system and elective cases, and I recommended waiting for any elective treatment of her nonobstructing left-sided stones as she remains asymptomatic, and has passed most of her stone spontaneously.  She is in agreement.  -Call with stone analysis results -Consider 63-KZSW urine/metabolic work-up in the future -Consider left ureteroscopy, laser lithotripsy, stent placement in the future for her moderate left renal stone burden -RTC 6 months KUB  A total of 15 minutes were spent face-to-face with the patient, greater than 50% was spent in patient education, counseling, and coordination of care regarding KUB results, left renal stone burden, and stone prevention.  Billey Co, [facility] [address] [place], [place] [address] [phone]

## 2019-07-13 ENCOUNTER — Other Ambulatory Visit: Payer: Self-pay | Admitting: Urology

## 2019-07-26 NOTE — Progress Notes (Signed)
Her stone was primarily calcium oxalate, the most common type of stone.  Continue prevention strategies as discussed in clinic, most important is high fluid intake, low-salt diet, and avoid high oxalate foods like spinach or almonds.  Nickolas Madrid, MD 07/26/2019

## 2019-07-27 ENCOUNTER — Telehealth: Payer: Self-pay | Admitting: *Deleted

## 2019-07-27 NOTE — Telephone Encounter (Addendum)
Patient informed-verbalized understanding.   ----- Message from Billey Co, MD sent at 07/26/2019  9:57 AM EST -----   Her stone was primarily calcium oxalate, the most common type of stone.  Continue prevention strategies as discussed in clinic, most important is high fluid intake, low-salt diet, and avoid high oxalate foods like spinach or almonds.  Nickolas Madrid, MD 07/26/2019  ----- Message ----- From: Delon Sacramento D Sent: 07/13/2019   1:26 PM EST To: Billey Co, MD

## 2019-08-04 ENCOUNTER — Ambulatory Visit: Payer: PRIVATE HEALTH INSURANCE | Admitting: Physician Assistant

## 2019-08-04 ENCOUNTER — Ambulatory Visit
Admission: RE | Admit: 2019-08-04 | Discharge: 2019-08-04 | Disposition: A | Discharge status: Home / Self Care | Payer: PRIVATE HEALTH INSURANCE | Source: Ambulatory Visit | Attending: Physician Assistant | Admitting: Physician Assistant

## 2019-08-04 ENCOUNTER — Encounter: Payer: Self-pay | Admitting: Physician Assistant

## 2019-08-04 ENCOUNTER — Other Ambulatory Visit: Payer: Self-pay

## 2019-08-04 VITALS — BP 134/83 | HR 69 | Ht 68.0 in | Wt 155.0 lb

## 2019-08-04 DIAGNOSIS — N281 Cyst of kidney, acquired: Secondary | ICD-10-CM | POA: Diagnosis not present

## 2019-08-04 DIAGNOSIS — Z87442 Personal history of urinary calculi: Secondary | ICD-10-CM

## 2019-08-04 DIAGNOSIS — R109 Unspecified abdominal pain: Secondary | ICD-10-CM | POA: Insufficient documentation

## 2019-08-04 DIAGNOSIS — R8290 Unspecified abnormal findings in urine: Secondary | ICD-10-CM

## 2019-08-04 LAB — URINALYSIS, COMPLETE
Bilirubin, UA: NEGATIVE
Glucose, UA: NEGATIVE
Ketones, UA: NEGATIVE
Nitrite, UA: POSITIVE — AB
Protein,UA: NEGATIVE
Specific Gravity, UA: 1.02 (ref 1.005–1.030)
Urobilinogen, Ur: 0.2 mg/dL (ref 0.2–1.0)
pH, UA: 7 (ref 5.0–7.5)

## 2019-08-04 LAB — MICROSCOPIC EXAMINATION: WBC, UA: >30 /hpf — AB (ref 0–5)

## 2019-08-04 MED ORDER — SULFAMETHOXAZOLE-TRIMETHOPRIM 800-160 MG PO TABS: 1.0000 | ORAL_TABLET | Freq: Two times a day (BID) | ORAL | 0 refills | Status: AC

## 2019-08-04 NOTE — Patient Instructions (Signed)
Go to [facility].

## 2019-08-04 NOTE — Progress Notes (Signed)
08/04/2019 9:31 AM   Courtney Cochran 1956-04-05 [id number]  CC: Left flank pain, malodorous urine  HPI: Courtney Cochran is a 64 y.o. female with PMH recurrent nephrolithiasis who presents today for evaluation of possible UTI. She most recently passed a 51mm left ureteral stone 1 month ago. CT stone study on 06/18/2019 revealed numerous bilateral renal stones, L>R. Follow-up KUB on 07/09/2019 revealed a possible left renal pelvic stone; she was asymptomatic at the time.  Today, she reports an approximate 4-day history of left flank pain and malodorous urine.  She denies fever, chills, nausea, vomiting, and gross hematuria.  She states the pain is not consistent with past stone episodes, as it is located more superior on her left flank and she typically experiences.  She has not taken any medication at home for management of her symptoms.  Of note, she mentions that she uses her home hot tub on a daily basis and wonders if this is playing a role in UTI development.  In-office UA today positive for trace-intact blood, nitrates, and 1+ leukocyte esterase; urine microscopy with >30 WBCs/HPF, 3-10 RBCs/HPF, and many bacteria.   PMH: Past Medical History:  Diagnosis Date  . Anemia    H/O 18 YEARS AGO  . Chronic kidney disease    STONES  . Family history of adverse reaction to anesthesia    PTS SISTER HAS WOKEN UP TWICE DURING SURGERY  . Foot injury    LEFT-FOOT BRUISED AND SWOLLEN PER PT(RELATED TO HER FALL THAT BROKE HER WRIST)  . GERD (gastroesophageal reflux disease)    RARE  . Osteopenia     Surgical History: Past Surgical History:  Procedure Laterality Date  . COLONOSCOPY    . DILATION AND CURETTAGE OF UTERUS    . KIDNEY STONE SURGERY    . OPEN REDUCTION INTERNAL FIXATION (ORIF) DISTAL RADIAL FRACTURE Right 02/22/2016   Procedure: OPEN REDUCTION INTERNAL FIXATION (ORIF) DISTAL RADIAL FRACTURE;  Surgeon: Christena Flake, MD;  Location: [facility];  Service:  Orthopedics;  Laterality: Right;    Home Medications:  Allergies as of 08/04/2019   No Known Allergies     Medication List       Accurate as of August 04, 2019  9:31 AM. If you have any questions, ask your nurse or doctor.        acetaminophen 325 MG tablet Commonly known as: TYLENOL Take 650 mg by mouth every 6 (six) hours as needed.   BIOTIN PO Take by mouth.   buPROPion 300 MG 24 hr tablet Commonly known as: WELLBUTRIN XL TK 1 T PO QD   calcium carbonate 500 MG chewable tablet Commonly known as: TUMS - dosed in mg elemental calcium Chew 1 tablet by mouth as needed for indigestion or heartburn.   cholecalciferol 1000 units tablet Commonly known as: VITAMIN D Take 5,000 Units by mouth daily.   CORAL CALCIUM PO Take 1 tablet by mouth daily.   ibuprofen 200 MG tablet Commonly known as: ADVIL Take 200 mg by mouth every 6 (six) hours as needed.   MAGNESIUM GLUCONATE PO Take by mouth.   Multi-Vitamin tablet Take by mouth.   Oil of Oregano 1500 MG Caps Take by mouth.   OMEGA 3 PO Take 1,280 mg by mouth 2 (two) times daily. Reported on 02/17/2016   Fish Oil 1000 MG Caps Take by mouth.   omeprazole 20 MG capsule Commonly known as: PRILOSEC Take by mouth.   phentermine 37.5 MG tablet Commonly known  as: ADIPEX-P Take 37.5 mg by mouth daily.   sucralfate 1 g tablet Commonly known as: CARAFATE Take by mouth.   TURMERIC PO Take by mouth.   valACYclovir 500 MG tablet Commonly known as: VALTREX Take by mouth.       Allergies:  No Known Allergies  Family History: Family History  Problem Relation Age of Onset  . Breast cancer Neg Hx     Social History:   reports that she quit smoking about 43 years ago. Her smoking use included cigarettes. She has a 4.00 pack-year smoking history. She has never used smokeless tobacco. She reports current alcohol use. She reports that she does not use drugs.  ROS: UROLOGY Frequent Urination?: No Hard to  postpone urination?: No Burning/pain with urination?: No Get up at night to urinate?: Yes Leakage of urine?: No Urine stream starts and stops?: No Trouble starting stream?: No Do you have to strain to urinate?: No Blood in urine?: No Urinary tract infection?: Yes Sexually transmitted disease?: No Injury to kidneys or bladder?: No Painful intercourse?: No Weak stream?: No Currently pregnant?: No Vaginal bleeding?: No Last menstrual period?: n  Gastrointestinal Nausea?: No Vomiting?: No Indigestion/heartburn?: Yes Diarrhea?: No Constipation?: Yes  Constitutional Fever: No Night sweats?: Yes Weight loss?: No Fatigue?: No  Skin Skin rash/lesions?: No Itching?: No  Eyes Blurred vision?: No Double vision?: No  Ears/Nose/Throat Sore throat?: No Sinus problems?: No  Hematologic/Lymphatic Swollen glands?: No Easy bruising?: No  Cardiovascular Leg swelling?: No Chest pain?: No  Respiratory Cough?: No Shortness of breath?: No  Endocrine Excessive thirst?: No  Musculoskeletal Back pain?: No Joint pain?: No  Neurological Headaches?: No Dizziness?: No  Psychologic Depression?: No Anxiety?: No  Physical Exam: BP 134/83   Pulse 69   Ht 5\' 8"  (1.727 m)   Wt 155 lb (70.3 kg)   BMI 23.57 kg/m   Constitutional:  Alert and oriented, no acute distress, nontoxic appearing HEENT: [place], AT Cardiovascular: No clubbing, cyanosis, or edema Respiratory: Normal respiratory effort, no increased work of breathing GU: No CVA tenderness Skin: No rashes, bruises or suspicious lesions Neurologic: Grossly intact, no focal deficits, moving all 4 extremities Psychiatric: Normal mood and affect  Laboratory Data: Results for orders placed or performed in visit on 08/04/19  Microscopic Examination   URINE  Result Value Ref Range   WBC, UA >30 (A) 0 - 5 /hpf   RBC 3-10 (A) 0 - 2 /hpf   Epithelial Cells (non renal) 0-10 0 - 10 /hpf   Bacteria, UA Many (A) None seen/Few    Urinalysis, Complete  Result Value Ref Range   Specific Gravity, UA 1.020 1.005 - 1.030   pH, UA 7.0 5.0 - 7.5   Color, UA Yellow Yellow   Appearance Ur Cloudy (A) Clear   Leukocytes,UA 1+ (A) Negative   Protein,UA Negative Negative/Trace   Glucose, UA Negative Negative   Ketones, UA Negative Negative   RBC, UA Trace (A) Negative   Bilirubin, UA Negative Negative   Urobilinogen, Ur 0.2 0.2 - 1.0 mg/dL   Nitrite, UA Positive (A) Negative   Microscopic Examination See below:    Pertinent imaging: Stat renal ultrasound, 08/04/2019: CLINICAL DATA:  LEFT flank pain history of nephrolithiasis  EXAM: RENAL / URINARY TRACT ULTRASOUND COMPLETE  COMPARISON:  CT 06/18/2019, 01/20/2019  FINDINGS: Right Kidney:  Renal measurements: 10.9 x 5.3 x 4.8 cm = volume: 146 mL. Echogenic calculi. Largest measuring 11 mm. No hydronephrosis.  In the upper pole of the  RIGHT kidney there is a mildly complex cystic lesion measuring 2.7 cm with a septation.  Left Kidney:  Renal measurements: 11.9 x 5.4 x 5.7 cm. = volume: 192 mL. Multiple large echogenic gallstones. Largest measures 17 mm. Mild hydronephrosis.  Bladder:  Bilateral ureteral jets identified.  Other:  None.  IMPRESSION: 1. Bilateral nephrolithiasis. 2. Mild hydronephrosis of the LEFT renal collecting system. 3. Mildly complex cyst the pole of the RIGHT kidney. Recommend MRI with without contrast for further evaluation. Lesion not conclusively evaluated as benign on prior CTs.   Electronically Signed   By: Genevive Bi M.D.   On: 08/04/2019 15:09  I personally reviewed the imaging above and note mild left hydronephrosis as well as a septated right renal cyst.  Assessment & Plan:   64 year old female with a history of recurrent nephrolithiasis, most recently having passed a left-sided ureteral stone 1 month ago presents today with a 4-day history of left flank pain and malodorous urine. 1. Malodorous  urine UA grossly infected today.  We will start her on antibiotics and send for culture. Will contact her if need to change therapy per culture results. - Urinalysis, Complete - CULTURE, URINE COMPREHENSIVE - sulfamethoxazole-trimethoprim (BACTRIM DS) 800-160 MG tablet; Take 1 tablet by mouth 2 (two) times daily for 5 days.  Dispense: 10 tablet; Refill: 0  2. Flank pain with history of urolithiasis Given history of nephrolithiasis with flank pain and microscopic hematuria on UA today, obtained STAT renal US today to rule out stone episode.  Findings significant for mild left hydronephrosis, unsure if this represents improvement of hydronephrosis noted per CT stone study on 06/18/2019 versus new hydronephrosis associated with new acute stone episode.  Given that patient is well-appearing on physical exam today, will proceed with UTI treatment and counseled patient if her symptoms fail to improve after 72 hours of culture appropriate antibiotics.  Will obtain repeat KUB at that point. - US RENAL   3. 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  Will follow up with renal MRI following resolution of the acute episode described above.  Return if symptoms worsen or fail to improve.  Carman Ching, PA-C  [facility] [address] [place], [place] [address] [phone]

## 2019-08-08 LAB — CULTURE, URINE COMPREHENSIVE

## 2019-08-11 ENCOUNTER — Telehealth: Payer: Self-pay | Admitting: Physician Assistant

## 2019-08-11 NOTE — Telephone Encounter (Signed)
Notified patient as instructed, patient pleased. Discussed follow-up appointments, patient agrees  

## 2019-08-11 NOTE — Telephone Encounter (Signed)
Please contact Courtney Cochran to report that she is on the right antibiotics for her UTI. The bacteria she grew is the most common type of bacteria associated with these types of infections; I do not see any evidence that her hot tub played a role in its development.

## 2019-08-13 ENCOUNTER — Telehealth: Payer: Self-pay | Admitting: Physician Assistant

## 2019-08-13 DIAGNOSIS — N281 Cyst of kidney, acquired: Secondary | ICD-10-CM

## 2019-08-13 NOTE — Telephone Encounter (Signed)
Just spoke with the patient via telephone to discuss the results of her renal ultrasound last week.  I explained that she has a right renal cyst that appears to be mildly septated.  I explained that despite the stable appearance of the cyst over prior ultrasounds and CT scans, it has not yet been definitively imaged and assigned a Bosniak score to determine any necessary follow-up.  I explained that I recommend she undergo MRI of her right kidney for further, definitive evaluation of the cyst.  MRI abdomen with and without contrast order placed today.  I explained that the imaging department would contact her to schedule this.  She expressed understanding.

## 2019-08-31 DIAGNOSIS — J189 Pneumonia, unspecified organism: Secondary | ICD-10-CM

## 2019-08-31 DIAGNOSIS — U071 COVID-19: Secondary | ICD-10-CM

## 2019-08-31 DIAGNOSIS — Z87442 Personal history of urinary calculi: Secondary | ICD-10-CM

## 2019-08-31 HISTORY — DX: COVID-19: U07.1

## 2019-08-31 HISTORY — DX: Personal history of urinary calculi: Z87.442

## 2019-08-31 HISTORY — DX: Pneumonia, unspecified organism: J18.9

## 2019-09-01 ENCOUNTER — Other Ambulatory Visit: Payer: Self-pay

## 2019-09-01 ENCOUNTER — Telehealth: Payer: Self-pay | Admitting: Physician Assistant

## 2019-09-01 ENCOUNTER — Ambulatory Visit
Admission: RE | Admit: 2019-09-01 | Discharge: 2019-09-01 | Disposition: A | Discharge status: Home / Self Care | Payer: PRIVATE HEALTH INSURANCE | Source: Ambulatory Visit | Attending: Physician Assistant | Admitting: Physician Assistant

## 2019-09-01 DIAGNOSIS — N281 Cyst of kidney, acquired: Secondary | ICD-10-CM | POA: Insufficient documentation

## 2019-09-01 LAB — POCT I-STAT CREATININE: Creatinine, Ser: 0.8 mg/dL (ref 0.44–1.00)

## 2019-09-01 MED ORDER — GADOBUTROL 1 MMOL/ML IV SOLN
7.0000 mL | Freq: Once | INTRAVENOUS | Status: AC | PRN
  Administered 2019-09-01: 7 mL via INTRAVENOUS

## 2019-09-01 NOTE — Telephone Encounter (Signed)
I just spoke with the patient via telephone to report the results of her abdominal MRI.  I explained that her renal cysts are benign in appearance and do not require further follow-up.  She expressed understanding.

## 2019-09-11 ENCOUNTER — Other Ambulatory Visit: Payer: Self-pay | Admitting: Physician Assistant

## 2019-09-11 ENCOUNTER — Encounter
Admission: EM | Disposition: A | Discharge status: Home / Self Care | Payer: Self-pay | Source: Home / Self Care | Attending: Internal Medicine

## 2019-09-11 ENCOUNTER — Other Ambulatory Visit: Payer: Self-pay

## 2019-09-11 ENCOUNTER — Encounter: Payer: Self-pay | Admitting: Emergency Medicine

## 2019-09-11 ENCOUNTER — Ambulatory Visit
Admission: RE | Admit: 2019-09-11 | Discharge: 2019-09-11 | Disposition: A | Discharge status: Home / Self Care | Payer: PRIVATE HEALTH INSURANCE | Source: Ambulatory Visit | Attending: Physician Assistant | Admitting: Physician Assistant

## 2019-09-11 ENCOUNTER — Other Ambulatory Visit
Admission: RE | Admit: 2019-09-11 | Discharge: 2019-09-11 | Disposition: A | Discharge status: Home / Self Care | Payer: PRIVATE HEALTH INSURANCE | Source: Ambulatory Visit | Attending: Physician Assistant | Admitting: Physician Assistant

## 2019-09-11 ENCOUNTER — Telehealth: Payer: Self-pay

## 2019-09-11 ENCOUNTER — Inpatient Hospital Stay: Payer: PRIVATE HEALTH INSURANCE

## 2019-09-11 ENCOUNTER — Emergency Department: Payer: PRIVATE HEALTH INSURANCE | Admitting: Anesthesiology

## 2019-09-11 ENCOUNTER — Inpatient Hospital Stay
Admission: EM | Admit: 2019-09-11 | Discharge: 2019-09-13 | DRG: 853 | Disposition: A | Discharge status: Home / Self Care | Payer: PRIVATE HEALTH INSURANCE | Attending: Internal Medicine | Admitting: Internal Medicine

## 2019-09-11 DIAGNOSIS — R1013 Epigastric pain: Secondary | ICD-10-CM

## 2019-09-11 DIAGNOSIS — Z79899 Other long term (current) drug therapy: Secondary | ICD-10-CM

## 2019-09-11 DIAGNOSIS — E876 Hypokalemia: Secondary | ICD-10-CM | POA: Diagnosis present

## 2019-09-11 DIAGNOSIS — Z87442 Personal history of urinary calculi: Secondary | ICD-10-CM

## 2019-09-11 DIAGNOSIS — N202 Calculus of kidney with calculus of ureter: Secondary | ICD-10-CM | POA: Diagnosis present

## 2019-09-11 DIAGNOSIS — A419 Sepsis, unspecified organism: Secondary | ICD-10-CM | POA: Diagnosis not present

## 2019-09-11 DIAGNOSIS — R0789 Other chest pain: Secondary | ICD-10-CM

## 2019-09-11 DIAGNOSIS — I248 Other forms of acute ischemic heart disease: Secondary | ICD-10-CM | POA: Diagnosis present

## 2019-09-11 DIAGNOSIS — E860 Dehydration: Secondary | ICD-10-CM | POA: Diagnosis present

## 2019-09-11 DIAGNOSIS — U071 COVID-19: Secondary | ICD-10-CM

## 2019-09-11 DIAGNOSIS — A4151 Sepsis due to Escherichia coli [E. coli]: Secondary | ICD-10-CM | POA: Diagnosis present

## 2019-09-11 DIAGNOSIS — N201 Calculus of ureter: Secondary | ICD-10-CM

## 2019-09-11 DIAGNOSIS — N135 Crossing vessel and stricture of ureter without hydronephrosis: Secondary | ICD-10-CM | POA: Insufficient documentation

## 2019-09-11 DIAGNOSIS — M8580 Other specified disorders of bone density and structure, unspecified site: Secondary | ICD-10-CM | POA: Diagnosis present

## 2019-09-11 DIAGNOSIS — N136 Pyonephrosis: Secondary | ICD-10-CM | POA: Diagnosis present

## 2019-09-11 DIAGNOSIS — N390 Urinary tract infection, site not specified: Secondary | ICD-10-CM | POA: Diagnosis not present

## 2019-09-11 DIAGNOSIS — J1282 Pneumonia due to coronavirus disease 2019: Secondary | ICD-10-CM | POA: Diagnosis present

## 2019-09-11 DIAGNOSIS — J181 Lobar pneumonia, unspecified organism: Secondary | ICD-10-CM | POA: Diagnosis not present

## 2019-09-11 DIAGNOSIS — R778 Other specified abnormalities of plasma proteins: Secondary | ICD-10-CM | POA: Diagnosis not present

## 2019-09-11 DIAGNOSIS — R12 Heartburn: Secondary | ICD-10-CM | POA: Insufficient documentation

## 2019-09-11 DIAGNOSIS — A4189 Other specified sepsis: Secondary | ICD-10-CM | POA: Diagnosis present

## 2019-09-11 DIAGNOSIS — K219 Gastro-esophageal reflux disease without esophagitis: Secondary | ICD-10-CM | POA: Diagnosis present

## 2019-09-11 DIAGNOSIS — Z87891 Personal history of nicotine dependence: Secondary | ICD-10-CM | POA: Diagnosis not present

## 2019-09-11 DIAGNOSIS — N179 Acute kidney failure, unspecified: Secondary | ICD-10-CM | POA: Diagnosis present

## 2019-09-11 DIAGNOSIS — N189 Chronic kidney disease, unspecified: Secondary | ICD-10-CM | POA: Diagnosis present

## 2019-09-11 DIAGNOSIS — J189 Pneumonia, unspecified organism: Secondary | ICD-10-CM

## 2019-09-11 DIAGNOSIS — Z87440 Personal history of urinary (tract) infections: Secondary | ICD-10-CM

## 2019-09-11 DIAGNOSIS — N3000 Acute cystitis without hematuria: Secondary | ICD-10-CM | POA: Diagnosis not present

## 2019-09-11 DIAGNOSIS — R6520 Severe sepsis without septic shock: Secondary | ICD-10-CM

## 2019-09-11 DIAGNOSIS — N132 Hydronephrosis with renal and ureteral calculous obstruction: Secondary | ICD-10-CM

## 2019-09-11 HISTORY — DX: COVID-19: U07.1

## 2019-09-11 HISTORY — PX: CYSTOSCOPY WITH STENT PLACEMENT: SHX5790

## 2019-09-11 LAB — RESPIRATORY PANEL BY RT PCR (FLU A&B, COVID)
Influenza A by PCR: NEGATIVE
Influenza B by PCR: NEGATIVE
SARS Coronavirus 2 by RT PCR: POSITIVE — AB

## 2019-09-11 LAB — CBC WITH DIFFERENTIAL/PLATELET
Abs Immature Granulocytes: 0.83 10*3/uL — ABNORMAL HIGH (ref 0.00–0.07)
Basophils Absolute: 0.1 10*3/uL (ref 0.0–0.1)
Basophils Relative: 1 %
Eosinophils Absolute: 0.1 10*3/uL (ref 0.0–0.5)
Eosinophils Relative: 0 %
HCT: 39.4 % (ref 36.0–46.0)
Hemoglobin: 12.9 g/dL (ref 12.0–15.0)
Immature Granulocytes: 4 %
Lymphocytes Relative: 5 %
Lymphs Abs: 1.1 10*3/uL (ref 0.7–4.0)
MCH: 29.3 pg (ref 26.0–34.0)
MCHC: 32.7 g/dL (ref 30.0–36.0)
MCV: 89.3 fL (ref 80.0–100.0)
Monocytes Absolute: 1.2 10*3/uL — ABNORMAL HIGH (ref 0.1–1.0)
Monocytes Relative: 6 %
Neutro Abs: 18.1 10*3/uL — ABNORMAL HIGH (ref 1.7–7.7)
Neutrophils Relative %: 84 %
Platelets: 260 10*3/uL (ref 150–400)
RBC: 4.41 MIL/uL (ref 3.87–5.11)
RDW: 13.9 % (ref 11.5–15.5)
WBC: 21.4 10*3/uL — ABNORMAL HIGH (ref 4.0–10.5)
nRBC: 0 % (ref 0.0–0.2)

## 2019-09-11 LAB — COMPREHENSIVE METABOLIC PANEL
ALT: 76 U/L — ABNORMAL HIGH (ref 0–44)
AST: 44 U/L — ABNORMAL HIGH (ref 15–41)
Albumin: 2.9 g/dL — ABNORMAL LOW (ref 3.5–5.0)
Alkaline Phosphatase: 194 U/L — ABNORMAL HIGH (ref 38–126)
Anion gap: 14 (ref 5–15)
BUN: 30 mg/dL — ABNORMAL HIGH (ref 8–23)
CO2: 25 mmol/L (ref 22–32)
Calcium: 8.4 mg/dL — ABNORMAL LOW (ref 8.9–10.3)
Chloride: 95 mmol/L — ABNORMAL LOW (ref 98–111)
Creatinine, Ser: 1.33 mg/dL — ABNORMAL HIGH (ref 0.44–1.00)
GFR calc Af Amer: 50 mL/min — ABNORMAL LOW (ref >60)
GFR calc non Af Amer: 43 mL/min — ABNORMAL LOW (ref >60)
Glucose, Bld: 95 mg/dL (ref 70–99)
Potassium: 3.5 mmol/L (ref 3.5–5.1)
Sodium: 134 mmol/L — ABNORMAL LOW (ref 135–145)
Total Bilirubin: 0.8 mg/dL (ref 0.3–1.2)
Total Protein: 7.4 g/dL (ref 6.5–8.1)

## 2019-09-11 LAB — TROPONIN I (HIGH SENSITIVITY)
Troponin I (High Sensitivity): 34 ng/L — ABNORMAL HIGH (ref <18)
Troponin I (High Sensitivity): 19 ng/L — ABNORMAL HIGH (ref <18)
Troponin I (High Sensitivity): 16 ng/L (ref <18)

## 2019-09-11 LAB — LACTIC ACID, PLASMA: Lactic Acid, Venous: 1.2 mmol/L (ref 0.5–1.9)

## 2019-09-11 LAB — URINALYSIS, COMPLETE (UACMP) WITH MICROSCOPIC
Bilirubin Urine: NEGATIVE
Glucose, UA: NEGATIVE mg/dL
Hgb urine dipstick: NEGATIVE
Ketones, ur: NEGATIVE mg/dL
Nitrite: NEGATIVE
Protein, ur: 30 mg/dL — AB
Specific Gravity, Urine: >1.046 — ABNORMAL HIGH (ref 1.005–1.030)
pH: 5 (ref 5.0–8.0)

## 2019-09-11 SURGERY — CYSTOSCOPY, WITH STENT INSERTION
Anesthesia: General | Site: Ureter | Laterality: Left

## 2019-09-11 MED ORDER — FENTANYL CITRATE (PF) 100 MCG/2ML IJ SOLN
INTRAMUSCULAR | Status: AC
  Filled 2019-09-11: qty 4

## 2019-09-11 MED ORDER — ZINC SULFATE 220 (50 ZN) MG PO CAPS
220.0000 mg | ORAL_CAPSULE | Freq: Every day | ORAL | Status: DC
  Administered 2019-09-11 – 2019-09-13 (×3): 220 mg via ORAL
  Filled 2019-09-11 (×3): qty 1

## 2019-09-11 MED ORDER — FENTANYL CITRATE (PF) 100 MCG/2ML IJ SOLN: 25.0000 ug | INTRAMUSCULAR | Status: DC | PRN

## 2019-09-11 MED ORDER — PANTOPRAZOLE SODIUM 40 MG IV SOLR
40.0000 mg | Freq: Once | INTRAVENOUS | Status: AC
  Administered 2019-09-11: 40 mg via INTRAVENOUS
  Filled 2019-09-11: qty 40

## 2019-09-11 MED ORDER — LIDOCAINE HCL URETHRAL/MUCOSAL 2 % EX GEL
CUTANEOUS | Status: AC
  Filled 2019-09-11: qty 10

## 2019-09-11 MED ORDER — IOTHALAMATE MEGLUMINE 43 % IV SOLN
INTRAVENOUS | Status: DC | PRN
  Administered 2019-09-11: 18:00:00 15 mL

## 2019-09-11 MED ORDER — FENTANYL CITRATE (PF) 100 MCG/2ML IJ SOLN
INTRAMUSCULAR | Status: AC
  Filled 2019-09-11: qty 2

## 2019-09-11 MED ORDER — SODIUM CHLORIDE 0.9 % IV SOLN
2.0000 g | INTRAVENOUS | Status: DC
  Administered 2019-09-12 – 2019-09-13 (×2): 2 g via INTRAVENOUS
  Filled 2019-09-11 (×2): qty 2

## 2019-09-11 MED ORDER — ONDANSETRON HCL 4 MG/2ML IJ SOLN
4.0000 mg | Freq: Four times a day (QID) | INTRAMUSCULAR | Status: DC | PRN
  Administered 2019-09-11: 4 mg via INTRAVENOUS

## 2019-09-11 MED ORDER — FENTANYL CITRATE (PF) 100 MCG/2ML IJ SOLN
INTRAMUSCULAR | Status: DC | PRN
  Administered 2019-09-11 (×2): 50 ug via INTRAVENOUS

## 2019-09-11 MED ORDER — LIDOCAINE HCL (CARDIAC) PF 100 MG/5ML IV SOSY
PREFILLED_SYRINGE | INTRAVENOUS | Status: DC | PRN
  Administered 2019-09-11: 80 mg via INTRAVENOUS

## 2019-09-11 MED ORDER — ASCORBIC ACID 500 MG PO TABS
500.0000 mg | ORAL_TABLET | Freq: Two times a day (BID) | ORAL | Status: DC
  Administered 2019-09-11 – 2019-09-13 (×4): 500 mg via ORAL
  Filled 2019-09-11 (×4): qty 1

## 2019-09-11 MED ORDER — SODIUM CHLORIDE 0.9 % IV BOLUS
1000.0000 mL | Freq: Once | INTRAVENOUS | Status: AC
  Administered 2019-09-11: 1000 mL via INTRAVENOUS

## 2019-09-11 MED ORDER — ACETAMINOPHEN 325 MG PO TABS: 650.0000 mg | ORAL_TABLET | Freq: Four times a day (QID) | ORAL | Status: DC | PRN

## 2019-09-11 MED ORDER — SODIUM CHLORIDE 0.9 % IV SOLN: INTRAVENOUS | Status: DC | PRN

## 2019-09-11 MED ORDER — SODIUM CHLORIDE 0.9 % IV SOLN
500.0000 mg | INTRAVENOUS | Status: DC
  Administered 2019-09-11: 23:00:00 500 mg via INTRAVENOUS
  Filled 2019-09-11 (×2): qty 500

## 2019-09-11 MED ORDER — THIAMINE HCL 100 MG PO TABS
100.0000 mg | ORAL_TABLET | Freq: Every day | ORAL | Status: DC
  Administered 2019-09-11 – 2019-09-12 (×2): 100 mg via ORAL
  Filled 2019-09-11 (×2): qty 1

## 2019-09-11 MED ORDER — SODIUM CHLORIDE 0.9 % IV SOLN
100.0000 mg | Freq: Every day | INTRAVENOUS | Status: DC
  Administered 2019-09-12 – 2019-09-13 (×2): 100 mg via INTRAVENOUS
  Filled 2019-09-11 (×2): qty 20

## 2019-09-11 MED ORDER — PROPOFOL 10 MG/ML IV BOLUS
INTRAVENOUS | Status: DC | PRN
  Administered 2019-09-11: 150 mg via INTRAVENOUS
  Administered 2019-09-11: 50 mg via INTRAVENOUS

## 2019-09-11 MED ORDER — SODIUM CHLORIDE 0.9 % IV SOLN
200.0000 mg | Freq: Once | INTRAVENOUS | Status: AC
  Administered 2019-09-11: 23:00:00 200 mg via INTRAVENOUS
  Filled 2019-09-11: qty 200

## 2019-09-11 MED ORDER — PHENYLEPHRINE HCL (PRESSORS) 10 MG/ML IV SOLN
INTRAVENOUS | Status: DC | PRN
  Administered 2019-09-11: 100 ug via INTRAVENOUS

## 2019-09-11 MED ORDER — POTASSIUM CHLORIDE IN NACL 20-0.9 MEQ/L-% IV SOLN
INTRAVENOUS | Status: DC
  Filled 2019-09-11 (×2): qty 1000

## 2019-09-11 MED ORDER — SODIUM CHLORIDE 0.9 % IV SOLN
1.0000 g | Freq: Once | INTRAVENOUS | Status: AC
  Administered 2019-09-11: 1 g via INTRAVENOUS
  Filled 2019-09-11: qty 10

## 2019-09-11 MED ORDER — OXYCODONE HCL 5 MG PO TABS: 5.0000 mg | ORAL_TABLET | ORAL | Status: DC | PRN

## 2019-09-11 MED ORDER — PANTOPRAZOLE SODIUM 40 MG PO TBEC
40.0000 mg | DELAYED_RELEASE_TABLET | Freq: Every day | ORAL | Status: DC
  Administered 2019-09-12 – 2019-09-13 (×2): 40 mg via ORAL
  Filled 2019-09-11 (×2): qty 1

## 2019-09-11 MED ORDER — ADULT MULTIVITAMIN W/MINERALS CH
1.0000 | ORAL_TABLET | Freq: Every day | ORAL | Status: DC
  Administered 2019-09-11 – 2019-09-13 (×3): 1 via ORAL
  Filled 2019-09-11 (×3): qty 1

## 2019-09-11 MED ORDER — ONDANSETRON HCL 4 MG/2ML IJ SOLN: 4.0000 mg | Freq: Once | INTRAMUSCULAR | Status: DC | PRN

## 2019-09-11 MED ORDER — IOHEXOL 300 MG/ML  SOLN
85.0000 mL | Freq: Once | INTRAMUSCULAR | Status: AC | PRN
  Administered 2019-09-11: 85 mL via INTRAVENOUS

## 2019-09-11 MED ORDER — ACETAMINOPHEN 650 MG RE SUPP: 650.0000 mg | Freq: Four times a day (QID) | RECTAL | Status: DC | PRN

## 2019-09-11 MED ORDER — DEXAMETHASONE SODIUM PHOSPHATE 10 MG/ML IJ SOLN
6.0000 mg | Freq: Two times a day (BID) | INTRAMUSCULAR | Status: DC
  Administered 2019-09-11: 23:00:00 6 mg via INTRAVENOUS
  Administered 2019-09-11: 5 mg via INTRAVENOUS
  Administered 2019-09-12: 6 mg via INTRAVENOUS
  Filled 2019-09-11 (×2): qty 1

## 2019-09-11 MED ORDER — PROPOFOL 10 MG/ML IV BOLUS
INTRAVENOUS | Status: AC
  Filled 2019-09-11: qty 40

## 2019-09-11 MED ORDER — ONDANSETRON HCL 4 MG PO TABS: 4.0000 mg | ORAL_TABLET | Freq: Four times a day (QID) | ORAL | Status: DC | PRN

## 2019-09-11 SURGICAL SUPPLY — 30 items
BAG DRAIN CYSTO-URO LG1000N (MISCELLANEOUS) ×2 IMPLANT
BRUSH SCRUB EZ  4% CHG (MISCELLANEOUS) ×1
BRUSH SCRUB EZ 4% CHG (MISCELLANEOUS) ×1 IMPLANT
CATH SILICONE 16FRX5CC (CATHETERS) ×2 IMPLANT
CATH URETL 5X70 OPEN END (CATHETERS) ×2 IMPLANT
CONRAY 43 FOR UROLOGY 50M (MISCELLANEOUS) ×2 IMPLANT
GLOVE BIO SURGEON STRL SZ7.5 (GLOVE) ×4 IMPLANT
GLOVE BIOGEL PI IND STRL 7.5 (GLOVE) ×3 IMPLANT
GLOVE BIOGEL PI INDICATOR 7.5 (GLOVE) ×3
GOWN STRL REUS W/ TWL LRG LVL3 (GOWN DISPOSABLE) ×1 IMPLANT
GOWN STRL REUS W/ TWL XL LVL3 (GOWN DISPOSABLE) ×3 IMPLANT
GOWN STRL REUS W/TWL LRG LVL3 (GOWN DISPOSABLE) ×1
GOWN STRL REUS W/TWL XL LVL3 (GOWN DISPOSABLE) ×3
GUIDEWIRE STR DUAL SENSOR (WIRE) ×2 IMPLANT
GUIDEWIRE STR ZIPWIRE 025X150 (MISCELLANEOUS) ×2 IMPLANT
KIT TURNOVER CYSTO (KITS) ×2 IMPLANT
PACK CYSTO AR (MISCELLANEOUS) ×2 IMPLANT
SET CYSTO W/LG BORE CLAMP LF (SET/KITS/TRAYS/PACK) ×2 IMPLANT
SOL .9 NS 3000ML IRR  AL (IV SOLUTION) ×1
SOL .9 NS 3000ML IRR UROMATIC (IV SOLUTION) ×1 IMPLANT
STENT PERCUFLEX 4.8FRX26 (STENTS) ×2 IMPLANT
STENT URET 6FRX24 CONTOUR (STENTS) IMPLANT
STENT URET 6FRX26 CONTOUR (STENTS) IMPLANT
SURGILUBE 2OZ TUBE FLIPTOP (MISCELLANEOUS) ×2 IMPLANT
SYRINGE IRR TOOMEY STRL 70CC (SYRINGE) IMPLANT
Sensor angle tip ×2 IMPLANT
VALVE UROSEAL ADJ ENDO (VALVE) ×2 IMPLANT
WATER STERILE IRR 1000ML POUR (IV SOLUTION) ×2 IMPLANT
WIRE G ANGLED .035X150 (MISCELLANEOUS) ×2 IMPLANT
uroseal adjustable valve ×2 IMPLANT

## 2019-09-11 NOTE — ED Notes (Addendum)
Pt states that she has been having flu like symptoms for the last few days and that she came because she had a fever and it worreid her.

## 2019-09-11 NOTE — Anesthesia Preprocedure Evaluation (Addendum)
Anesthesia Evaluation  Patient identified by MRN, date of birth, ID band Patient awake    Reviewed: Allergy & Precautions, H&P , NPO status , Patient's Chart, lab work & pertinent test results, reviewed documented beta blocker date and time   Airway Mallampati: II  TM Distance: >3 FB Neck ROM: full    Dental  (+) Teeth Intact   Pulmonary neg pulmonary ROS, former smoker,    Pulmonary exam normal        Cardiovascular Exercise Tolerance: Good negative cardio ROS Normal cardiovascular exam Rate:Normal     Neuro/Psych negative neurological ROS  negative psych ROS   GI/Hepatic Neg liver ROS, GERD  Medicated,  Endo/Other  negative endocrine ROS  Renal/GU Renal disease  negative genitourinary   Musculoskeletal   Abdominal   Peds  Hematology  (+) Blood dyscrasia, anemia ,   Anesthesia Other Findings   Reproductive/Obstetrics negative OB ROS                             Anesthesia Physical Anesthesia Plan  ASA: II and emergent  Anesthesia Plan: General LMA   Post-op Pain Management:    Induction:   PONV Risk Score and Plan: 4 or greater  Airway Management Planned:   Additional Equipment:   Intra-op Plan:   Post-operative Plan:   Informed Consent: I have reviewed the patients History and Physical, chart, labs and discussed the procedure including the risks, benefits and alternatives for the proposed anesthesia with the patient or authorized representative who has indicated his/her understanding and acceptance.       Plan Discussed with: CRNA  Anesthesia Plan Comments:         Anesthesia Quick Evaluation

## 2019-09-11 NOTE — Consult Note (Signed)
Remdesivir - Pharmacy Brief Note   A/P:  Remdesivir 200 mg IVPB once followed by 100 mg IVPB daily x 4 days.   Paschal Dopp 09/11/2019 7:11 PM

## 2019-09-11 NOTE — Telephone Encounter (Signed)
Pt calls and states that she currently in the ED and was told she has a large kidney stone. She wanted to make you aware as you are her urologist. Informed pt urologist on call would be paged.

## 2019-09-11 NOTE — Transfer of Care (Signed)
Immediate Anesthesia Transfer of Care Note  Patient: Courtney Cochran  Procedure(s) Performed: CYSTOSCOPY WITH STENT PLACEMENT (Left Ureter)  Patient Location: PACU  Anesthesia Type:General  Level of Consciousness: awake, alert  and oriented  Airway & Oxygen Therapy: Patient Spontanous Breathing and Patient connected to face mask oxygen  Post-op Assessment: Report given to RN and Post -op Vital signs reviewed and stable  Post vital signs: Reviewed and stable  Last Vitals:  Vitals Value Taken Time  BP    Temp    Pulse    Resp    SpO2      Last Pain:  Vitals:   09/11/19 1504  TempSrc: Oral  PainSc: 0-No pain         Complications: No apparent anesthesia complications

## 2019-09-11 NOTE — Op Note (Addendum)
Date of procedure: 09/11/19  Preoperative diagnosis:  1. 1.4 cm left proximal ureteral stone 2. Sepsis from urinary source  Postoperative diagnosis:  1. Same  Procedure: 1. Cystoscopy, left retrograde pyelogram with intraoperative interpretation, left ureteral stent placement   Surgeon: Legrand Rams, MD  Anesthesia: General  Complications: None  Intraoperative findings:  1.  Extremely challenging left ureteral stent placement secondary to angled proximal ureter and impacted stone 2.  Brisk drainage of urine through the side ports of stent after placement  EBL: Minimal  Specimens: None  Drains: Left 4.8 French by 26 cm ureteral stent  Indication: Courtney Cochran is a 64 y.o. patient with fevers and shaking chills over the last week and CT scan today showing a 1.4 cm left proximal ureteral stone with upstream hydronephrosis.  She was also Covid positive, and urine culture from 3 days ago grew E. coli.  After reviewing the management options for treatment, they elected to proceed with the above surgical procedure(s). We have discussed the potential benefits and risks of the procedure, side effects of the proposed treatment, the likelihood of the patient achieving the goals of the procedure, and any potential problems that might occur during the procedure or recuperation. Informed consent has been obtained.  Description of procedure:  The patient was taken to the operating room and general anesthesia was induced. SCDs were placed for DVT prophylaxis.  The patient was placed in the dorsal lithotomy position, prepped and draped in the usual sterile fashion, and preoperative antibiotics(ceftriaxone) were administered. A preoperative time-out was performed.   A 21 French rigid cystoscope was used to intubate the urethra.  There were no obvious bladder lesions.  A retrograde pyelogram was performed on the left side and there was no passage of contrast alongside the proximal ureteral  stone.  There also appeared to be some kinking of the ureter below the stone with a 90 degree lateral turn.  I attempted to pass a wire alongside the stone for 20 to 30 minutes using a sensor wire, Glidewire, and angled Glidewire without success.  An additional retrograde pyelogram was performed which showed no extravasation of contrast or passage of contrast alongside the stone.  I elected to pass the ureteroscope to try to pass a wire alongside the stone under direct vision secondary to the angled ureter.  The sensor wire was advanced to just below the level of the stone and a digital flexible ureteroscope was advanced over the wire.  Under direct vision, there was angling of the ureter and significant ureteral edema and I was unable to visualize any stone or true lumen of the ureter.  I was unable to advance a sensor wire past the blockage.  At this point, I elected to try one more time with a 5 French access catheter and an angled sensor wire.  A slurry of lubrication and contrast was injected through the 5 [place] access catheter below the level of the stone to try to facilitate wire passage.  With great difficulty, I was able to navigate an angled sensor wire past the stone over the projected location of the kidney.  I was unable to advance the access catheter over the wire to confirm placement.  I then backloaded the rigid cystoscope over the wire and was able to place a 4.8 [place] by 26 cm ureteral stent.  There was an excellent curl over the projected location of the renal pelvis, as well as under direct vision the bladder.  There was brisk drainage of  yellow urine through the side ports of the stent.  A Foley was placed to maximize drainage.  This case took 60 minutes secondary to the significantly impacted stone, stone location, and kinking of the ureter making access to the kidney extremely challenging compared to a standard ureteral stent placement that would take 10 minutes.  Disposition: Stable  to PACU  Plan: Maintain Foley until afebrile 24 hours Continue broad-spectrum antibiotics and narrow as able, recommend antibiotics based on E. coli urine culture from 09/08/2019 We will arrange follow-up ureteroscopy in 3 weeks for definitive management of her entire left sided stone burden  Nickolas Madrid, MD

## 2019-09-11 NOTE — ED Provider Notes (Signed)
[facility] Emergency Department Provider Note  ____________________________________________  Time seen: Approximately 5:08 PM  I have reviewed the triage vital signs and the nursing notes.   HISTORY  Chief Complaint Nephrolithiasis and Recurrent UTI    HPI Courtney Cochran is a 64 y.o. female with a past history of kidney stones and GERD who comes to the ED complaining of recent UTI, elevated LFTs and elevated white blood cell count.  Her CT scan done today which showed a 14 mm stone in the left mid ureter causing high-grade obstruction.  Also reports a fever today.  Has left-sided abdominal pain.  No chest pain or shortness of breath.  No cough.  No body aches.  Had Covid test done 3 days ago through [facility] which was negative.      Past Medical History:  Diagnosis Date  . Anemia    H/O 18 YEARS AGO  . Chronic kidney disease    STONES  . Family history of adverse reaction to anesthesia    PTS SISTER HAS WOKEN UP TWICE DURING SURGERY  . Foot injury    LEFT-FOOT BRUISED AND SWOLLEN PER PT(RELATED TO HER FALL THAT BROKE HER WRIST)  . GERD (gastroesophageal reflux disease)    RARE  . Osteopenia      There are no problems to display for this patient.    Past Surgical History:  Procedure Laterality Date  . COLONOSCOPY    . DILATION AND CURETTAGE OF UTERUS    . KIDNEY STONE SURGERY    . OPEN REDUCTION INTERNAL FIXATION (ORIF) DISTAL RADIAL FRACTURE Right 02/22/2016   Procedure: OPEN REDUCTION INTERNAL FIXATION (ORIF) DISTAL RADIAL FRACTURE;  Surgeon: Christena Flake, MD;  Location: [facility];  Service: Orthopedics;  Laterality: Right;     Prior to Admission medications   Medication Sig Start Date End Date Taking? Authorizing Provider  acetaminophen (TYLENOL) 325 MG tablet Take 650 mg by mouth every 6 (six) hours as needed.    [provider]  BIOTIN PO Take by mouth.    [provider]  buPROPion (WELLBUTRIN XL)  300 MG 24 hr tablet TK 1 T PO QD 09/08/18   [provider]  calcium carbonate (TUMS - DOSED IN MG ELEMENTAL CALCIUM) 500 MG chewable tablet Chew 1 tablet by mouth as needed for indigestion or heartburn.    [provider]  cholecalciferol (VITAMIN D) 1000 units tablet Take 5,000 Units by mouth daily.    [provider]  CORAL CALCIUM PO Take 1 tablet by mouth daily.    [provider]  ibuprofen (ADVIL,MOTRIN) 200 MG tablet Take 200 mg by mouth every 6 (six) hours as needed.    [provider]  MAGNESIUM GLUCONATE PO Take by mouth.    [provider]  Multiple Vitamin (MULTI-VITAMIN) tablet Take by mouth.    [provider]  Oil of Oregano 1500 MG CAPS Take by mouth.    [provider]  Omega-3 Fatty Acids (FISH OIL) 1000 MG CAPS Take by mouth.    [provider]  Omega-3 Fatty Acids (OMEGA 3 PO) Take 1,280 mg by mouth 2 (two) times daily. Reported on 02/17/2016    [provider]  omeprazole (PRILOSEC) 20 MG capsule Take by mouth. 07/07/19 07/21/19  [provider]  phentermine (ADIPEX-P) 37.5 MG tablet Take 37.5 mg by mouth daily. 12/23/15   [provider]  sucralfate (CARAFATE) 1 g tablet Take by mouth. 07/07/19 07/14/19  [provider]  TURMERIC PO Take by mouth.    [provider]  valACYclovir (VALTREX) 500 MG tablet Take by mouth. 06/13/16   [provider]     Allergies Patient has no known allergies.   Family History  Problem Relation Age of Onset  . Breast cancer Neg Hx     Social History Social History   Tobacco Use  . Smoking status: Former Smoker    Packs/day: 1.00    Years: 4.00    Pack years: 4.00    Types: Cigarettes    Quit date: 02/20/1976    Years since quitting: 43.5  . Smokeless tobacco: Never Used  Substance Use Topics  . Alcohol use: Yes    Comment: RARE-WINE  . Drug use: No    Review of Systems  Constitutional:    Positive fever without chills.  ENT:   No sore throat. No rhinorrhea. Cardiovascular:   No chest pain or syncope. Respiratory:   No dyspnea or cough. Gastrointestinal:   Positive left-sided abdominal pain, no vomiting or diarrhea Musculoskeletal:   Negative for focal pain or swelling All other systems reviewed and are negative except as documented above in ROS and HPI.  ____________________________________________   PHYSICAL EXAM:  VITAL SIGNS: ED Triage Vitals [09/11/19 1504]  Enc Vitals Group     BP 134/80     Pulse Rate 93     Resp 16     Temp 98.3 F (36.8 C)     Temp Source Oral     SpO2 98 %     Weight 154 lb (69.9 kg)     Height 5\' 8"  (1.727 m)     Head Circumference      Peak Flow      Pain Score 0     Pain Loc      Pain Edu?      Excl. in GC?     Vital signs reviewed, nursing assessments reviewed.   Constitutional:   Alert and oriented. Non-toxic appearance. Eyes:   Conjunctivae are normal. EOMI. PERRL. ENT      Head:   Normocephalic and atraumatic.      Nose:   Wearing a mask.      Mouth/Throat:   Wearing a mask.      Neck:   No meningismus. Full ROM. Hematological/Lymphatic/Immunilogical:   No cervical lymphadenopathy. Cardiovascular:   RRR. Symmetric bilateral radial and DP pulses.  No murmurs. Cap refill less than 2 seconds. Respiratory:   Normal respiratory effort without tachypnea/retractions. Breath sounds are clear and equal bilaterally. No wheezes/rales/rhonchi. Gastrointestinal:   Soft and nontender. Non distended. There is no CVA tenderness.  No rebound, rigidity, or guarding. Musculoskeletal:   Normal range of motion in all extremities. No joint effusions.  No lower extremity tenderness.  No edema. Neurologic:   Normal speech and language.  Motor grossly intact. No acute focal neurologic deficits are appreciated.  Skin:    Skin is warm, dry and intact. No rash noted.  No petechiae, purpura, or  bullae.  ____________________________________________    LABS (pertinent positives/negatives) (all labs ordered are listed, but only abnormal results are displayed) Labs Reviewed  URINALYSIS, COMPLETE (UACMP) WITH MICROSCOPIC - Abnormal; Notable for the following components:      Result Value   Color, Urine YELLOW (*)    APPearance CLEAR (*)    Specific Gravity, Urine >1.046 (*)    Protein, ur 30 (*)    Leukocytes,Ua SMALL (*)    Bacteria, UA RARE (*)  All other components within normal limits  CULTURE, BLOOD (ROUTINE X 2)  CULTURE, BLOOD (ROUTINE X 2)  URINE CULTURE  RESPIRATORY PANEL BY RT PCR (FLU A&B, COVID)  LACTIC ACID, PLASMA  LACTIC ACID, PLASMA  COMPREHENSIVE METABOLIC PANEL  CBC WITH DIFFERENTIAL/PLATELET  TROPONIN I (HIGH SENSITIVITY)   ____________________________________________   EKG  Interpreted by me Normal sinus rhythm rate of 92, normal axis and intervals.  Poor R wave fraction.  Normal ST segments and T waves  ____________________________________________    RADIOLOGY  CT CHEST W CONTRAST  Result Date: 09/11/2019 CLINICAL DATA:  Pneumonia. Atypical chest pain and epigastric pain. EXAM: CT CHEST, ABDOMEN, AND PELVIS WITH CONTRAST TECHNIQUE: Multidetector CT imaging of the chest, abdomen and pelvis was performed following the standard protocol during bolus administration of intravenous contrast. CONTRAST:  40mL OMNIPAQUE IOHEXOL 300 MG/ML  SOLN COMPARISON:  MRI abdomen 09/01/2019 and CT scan 06/18/2019 FINDINGS: CT CHEST FINDINGS Cardiovascular: The heart is upper limits of normal in size. No pericardial effusion. The aorta is normal in caliber. No dissection. No significant atherosclerotic calcifications. Branch vessels are patent. No definite coronary artery calcifications. The pulmonary arteries appear normal. Mediastinum/Nodes: No mediastinal or hilar mass or adenopathy. The esophagus is grossly normal. Lungs/Pleura: There are patchy vague ground-glass  opacities in both lungs, right greater than left. Findings could be due to atypical/viral pneumonia such as COVID pneumonia. Recommend clinical correlation. There is also streaky bibasilar subsegmental atelectasis and a very small left pleural effusion. No worrisome pulmonary lesions. Musculoskeletal: No breast masses, supraclavicular or axillary adenopathy. The thyroid gland appears normal. The bony structures are intact CT ABDOMEN PELVIS FINDINGS Hepatobiliary: Tiny low-attenuation lesion at the hepatic dome is likely a benign cyst. No worrisome hepatic lesions or intrahepatic biliary dilatation. The liver contour is slightly irregular and the hepatic parenchyma appears slightly heterogeneous. The hepatic fissures are slightly prominent and findings suspicious for mild cirrhosis. The gallbladder is normal. Pancreas: No mass, inflammation or ductal dilatation. Spleen: Upper limits of normal in size. No splenic lesions. Adrenals/Urinary Tract: The adrenal glands are unremarkable. 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 There is a high-grade obstruction of the left kidney secondary to a 14 x 10 mm calculus in the mid left ureter located at the L4 vertebral body level. Severe hydroureteronephrosis and decreased perfusion of the left kidney. No distal ureteral calculi. No bladder calculi. The bladder is grossly normal. Its largely decompressed. Stomach/Bowel: The stomach, duodenum, small bowel and colon are grossly normal. No acute inflammatory changes, mass lesions or obstructive findings. The terminal ileum is normal. The appendix is normal. Fairly significant descending and sigmoid colon diverticulosis but no findings for acute diverticulitis. Vascular/Lymphatic: Scattered aortic calcifications but no aneurysm or dissection. The branch vessels are patent. The major venous structures are patent. Small scattered mesenteric and retroperitoneal lymph nodes but no mass or adenopathy.  Reproductive: Enlarged fibroid uterus. Both ovaries appear normal. Other: No pelvic mass or adenopathy. No free pelvic fluid collections. No inguinal mass or adenopathy. No abdominal wall hernia or subcutaneous lesions. Musculoskeletal: No significant bony findings. Moderate degenerative changes involving the lumbar spine. IMPRESSION: 1. High-grade obstruction of the left kidney secondary to a 14 x 10 mm calculus in the mid left ureter at the L4 level. 2. 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 3. Patchy ground-glass opacities in both lungs could be due to atypical/viral pneumonia such as COVID pneumonia. There is also streaky bibasilar atelectasis and small left effusion. 4. No mediastinal or hilar  mass or adenopathy. 5. Suspect mild cirrhosis. 6. Enlarged fibroid uterus. Aortic Atherosclerosis (ICD10-I70.0). Electronically Signed   By: Marijo Sanes M.D.   On: 09/11/2019 14:20   CT ABDOMEN PELVIS W CONTRAST  Result Date: 09/11/2019 CLINICAL DATA:  Pneumonia. Atypical chest pain and epigastric pain. EXAM: CT CHEST, ABDOMEN, AND PELVIS WITH CONTRAST TECHNIQUE: Multidetector CT imaging of the chest, abdomen and pelvis was performed following the standard protocol during bolus administration of intravenous contrast. CONTRAST:  20mL OMNIPAQUE IOHEXOL 300 MG/ML  SOLN COMPARISON:  MRI abdomen 09/01/2019 and CT scan 06/18/2019 FINDINGS: CT CHEST FINDINGS Cardiovascular: The heart is upper limits of normal in size. No pericardial effusion. The aorta is normal in caliber. No dissection. No significant atherosclerotic calcifications. Branch vessels are patent. No definite coronary artery calcifications. The pulmonary arteries appear normal. Mediastinum/Nodes: No mediastinal or hilar mass or adenopathy. The esophagus is grossly normal. Lungs/Pleura: There are patchy vague ground-glass opacities in both lungs, right greater than left. Findings could be due to atypical/viral pneumonia  such as COVID pneumonia. Recommend clinical correlation. There is also streaky bibasilar subsegmental atelectasis and a very small left pleural effusion. No worrisome pulmonary lesions. Musculoskeletal: No breast masses, supraclavicular or axillary adenopathy. The thyroid gland appears normal. The bony structures are intact CT ABDOMEN PELVIS FINDINGS Hepatobiliary: Tiny low-attenuation lesion at the hepatic dome is likely a benign cyst. No worrisome hepatic lesions or intrahepatic biliary dilatation. The liver contour is slightly irregular and the hepatic parenchyma appears slightly heterogeneous. The hepatic fissures are slightly prominent and findings suspicious for mild cirrhosis. The gallbladder is normal. Pancreas: No mass, inflammation or ductal dilatation. Spleen: Upper limits of normal in size. No splenic lesions. Adrenals/Urinary Tract: The adrenal glands are unremarkable. 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 There is a high-grade obstruction of the left kidney secondary to a 14 x 10 mm calculus in the mid left ureter located at the L4 vertebral body level. Severe hydroureteronephrosis and decreased perfusion of the left kidney. No distal ureteral calculi. No bladder calculi. The bladder is grossly normal. Its largely decompressed. Stomach/Bowel: The stomach, duodenum, small bowel and colon are grossly normal. No acute inflammatory changes, mass lesions or obstructive findings. The terminal ileum is normal. The appendix is normal. Fairly significant descending and sigmoid colon diverticulosis but no findings for acute diverticulitis. Vascular/Lymphatic: Scattered aortic calcifications but no aneurysm or dissection. The branch vessels are patent. The major venous structures are patent. Small scattered mesenteric and retroperitoneal lymph nodes but no mass or adenopathy. Reproductive: Enlarged fibroid uterus. Both ovaries appear normal. Other: No pelvic mass or  adenopathy. No free pelvic fluid collections. No inguinal mass or adenopathy. No abdominal wall hernia or subcutaneous lesions. Musculoskeletal: No significant bony findings. Moderate degenerative changes involving the lumbar spine. IMPRESSION: 1. High-grade obstruction of the left kidney secondary to a 14 x 10 mm calculus in the mid left ureter at the L4 level. 2. 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 3. Patchy ground-glass opacities in both lungs could be due to atypical/viral pneumonia such as COVID pneumonia. There is also streaky bibasilar atelectasis and small left effusion. 4. No mediastinal or hilar mass or adenopathy. 5. Suspect mild cirrhosis. 6. Enlarged fibroid uterus. Aortic Atherosclerosis (ICD10-I70.0). Electronically Signed   By: Marijo Sanes M.D.   On: 09/11/2019 14:20    ____________________________________________   PROCEDURES Procedures  ____________________________________________    CLINICAL IMPRESSION / ASSESSMENT AND PLAN / ED COURSE  Medications ordered in the ED: Medications  sodium  chloride 0.9 % bolus 1,000 mL (1,000 mLs Intravenous New Bag/Given 09/11/19 1629)  cefTRIAXone (ROCEPHIN) 1 g in sodium chloride 0.9 % 100 mL IVPB (1 g Intravenous New Bag/Given 09/11/19 1631)    Pertinent labs & imaging results that were available during my care of the patient were reviewed by me and considered in my medical decision making (see chart for details).  Courtney Cochran was evaluated in Emergency Department on 09/11/2019 for the symptoms described in the history of present illness. She was evaluated in the context of the global COVID-19 pandemic, which necessitated consideration that the patient might be at risk for infection with the SARS-CoV-2 virus that causes COVID-19. Institutional protocols and algorithms that pertain to the evaluation of patients at risk for COVID-19 are in a state of rapid change based on information released  by regulatory bodies including the CDC and federal and state organizations. These policies and algorithms were followed during the patient's care in the ED.   Patient presents with CT finding of high-grade ureteral obstruction with concurrent UTI as seen on outpatient urine culture.  Discussed with urology Dr. Irish Elders who recommends IV ceftriaxone, urgent ureteral stent placement.  CT scan shows groundglass opacities in bilateral lower lungs, so despite recent negative Covid we will repeat Covid PCR today.  Vital signs are okay she is not septic.  I will give her IV Protonix for her abdominal pain, which she feels like is similar to her GERD.  Plan to discuss with hospitalist for further inpatient management after stent placement.      ____________________________________________   FINAL CLINICAL IMPRESSION(S) / ED DIAGNOSES    Final diagnoses:  Obstruction of left ureter  Urinary tract infection without hematuria, site unspecified     ED Discharge Orders    None      Portions of this note were generated with dragon dictation software. Dictation errors may occur despite best attempts at proofreading.   Sharman Cheek, MD 09/11/19 1712

## 2019-09-11 NOTE — Consult Note (Addendum)
09/11/19 3:55 PM   Courtney Cochran 23-Dec-1955 [id number]  CC: Left proximal ureteral stone and UTI  HPI: I saw Courtney Cochran in consultation from Dr. Erma Cochran for a left proximal ureteral stone with UTI.  She is a healthy 64 year old female with a history of recurrent nephrolithiasis who presents to the ED with 1 week of intermittent fever to 104 and shaking chills.  She is also had some intermittent left lower abdominal pain and left flank pain and some foul-smelling urine.  She was seen by her primary this week and diagnosed with pneumonia and started on doxycycline which has improved her fevers.  Work-up with PCP included chest x-ray suspicious for pneumonia, leukocytosis to 19.3 today, and urine culture positive for 50-100k E. coli on 2/9.  CT today showed a 14 mm calculus of the left proximal ureter with significant upstream hydronephrosis and significant left renal stone burden.  There is also patchy groundglass opacities in both lungs suspicious for atypical/viral pneumonia including Covid pneumonia.  She has had multiple negative Covid test in the last week, most recently Tuesday.  She denies any shortness of breath.  PMH: Past Medical History:  Diagnosis Date  . Anemia    H/O 18 YEARS AGO  . Chronic kidney disease    STONES  . Family history of adverse reaction to anesthesia    PTS SISTER HAS WOKEN UP TWICE DURING SURGERY  . Foot injury    LEFT-FOOT BRUISED AND SWOLLEN PER PT(RELATED TO HER FALL THAT BROKE HER WRIST)  . GERD (gastroesophageal reflux disease)    RARE  . Osteopenia     Surgical History: Past Surgical History:  Procedure Laterality Date  . COLONOSCOPY    . DILATION AND CURETTAGE OF UTERUS    . KIDNEY STONE SURGERY    . OPEN REDUCTION INTERNAL FIXATION (ORIF) DISTAL RADIAL FRACTURE Right 02/22/2016   Procedure: OPEN REDUCTION INTERNAL FIXATION (ORIF) DISTAL RADIAL FRACTURE;  Surgeon: Courtney Flake, MD;  Location: [facility];  Service:  Orthopedics;  Laterality: Right;    Allergies: No Known Allergies  Family History: Family History  Problem Relation Age of Onset  . Breast cancer Neg Hx     Social History:  reports that she quit smoking about 43 years ago. Her smoking use included cigarettes. She has a 4.00 pack-year smoking history. She has never used smokeless tobacco. She reports current alcohol use. She reports that she does not use drugs.  ROS: Negative aside from those stated in the HPI  Physical Exam: BP 134/80 (BP Location: Left Arm)   Pulse 93   Temp 98.3 F (36.8 C) (Oral)   Resp 16   Ht 5\' 8"  (1.727 m)   Wt 69.9 kg   SpO2 98%   BMI 23.42 kg/m    Constitutional:  Alert and oriented, No acute distress.  Well-appearing Cardiovascular: Regular rate and rhythm Respiratory: Clear to auscultation bilaterally GI: Abdomen is soft, nontender, nondistended, no abdominal masses GU: No CVA tenderness Lymph: No cervical or inguinal lymphadenopathy. Skin: No rashes, bruises or suspicious lesions. Neurologic: Grossly intact, no focal deficits, moving all 4 extremities. Psychiatric: Normal mood and affect.  Laboratory Data: Reviewed, see HPI Covid positive today  Pertinent Imaging: Reviewed, see HPI  Assessment & Plan:   In summary, she is a 64 year old female with an infected 14 mm left proximal ureteral stone and recurrent fevers and shaking chills over the last week, COVID + tonight, and CT chest findings consistent with COVID pneumonia.  We  discussed the need for drainage in the setting of an infected and obstructed system.  A ureteral stent is a small plastic tube that is placed cystoscopically with one end in the kidney and the other end in the bladder that allows the infection from the kidney to drain, and relieves pain from the obstructing stone.  We discussed the risks at length including bleeding, infection, sepsis, death, ureteral injury, and stent related symptoms including  urgency/frequency/dysuria/flank pain/gross hematuria.  There is a low, but not 0, risk of inability to pass the ureteral stent alongside the stone from below which would require percutaneous nephrostomy tube by interventional radiology.  Finally, we discussed possible prolonged hospitalization and recovery, possible temporary Foley catheter placement, and 10 to 14-day course of antibiotics.  We reviewed the need for a follow-up procedure for definitive management of their stone when the infection has been treated in 2 to 3 weeks with ureteroscopy/laser lithotripsy, and would plan to treat all her left-sided stone burden at that time.  Agree with admission to hospitalist, EKG for mildly elevated troponin Plan on cystoscopy and left ureteral stent placement tonight Continue broad-spectrum antibiotics(see 2/9 urine culture with E. coli in care everywhere for sensitivities)   Courtney Madrid, MD 09/11/2019  [facility] [address] [place], [place] [address] [phone]

## 2019-09-11 NOTE — Progress Notes (Signed)
Patient ID: Courtney Cochran, female   DOB: [birth year], 64 y.o.   MRN: [id number]  Notified of patient being covid-19 positive.  Ordered decadron, remdesivir, vit c, zinc, thiamine.  Ordered inflammatory markers.  Ordered airborne precautions.  Dr. Alford Highland

## 2019-09-11 NOTE — H&P (Signed)
[facility] [place] [facility]   PATIENT NAME: Courtney Cochran    MR#:  [id number]  DATE OF BIRTH:  1955-12-08  DATE OF ADMISSION:  09/11/2019  PRIMARY CARE PHYSICIAN: Lynnea Ferrier, MD   REQUESTING/REFERRING PHYSICIAN: Dr Sharman Cheek  CHIEF COMPLAINT:   Chief Complaint  Patient presents with  . Nephrolithiasis  . Recurrent UTI    HISTORY OF PRESENT ILLNESS:  Courtney Cochran  is a 64 y.o. female has been battling a urinary infection as outpatient.  She has been taking antibiotics and spiking some high fevers.  She states she broke her fever on Wednesday.  She has been having no appetite and she has been dehydrated.  Having a constant headache.  Having severe heartburn.  Some nausea vomiting.  No abdominal pain.  She been on an antibiotic for urinary infection.  Had a CT scan of the chest abdomen pelvis that showed pneumonia, large obstructing kidney stone.  Urology was consulted and they want to take to the operating room for stent but waiting for repeat Covid to come back.  Hospitalist were contacted for admission.  White blood cell count elevated at 21.4.  PAST MEDICAL HISTORY:   Past Medical History:  Diagnosis Date  . Anemia    H/O 18 YEARS AGO  . Chronic kidney disease    STONES  . Family history of adverse reaction to anesthesia    PTS SISTER HAS WOKEN UP TWICE DURING SURGERY  . Foot injury    LEFT-FOOT BRUISED AND SWOLLEN PER PT(RELATED TO HER FALL THAT BROKE HER WRIST)  . GERD (gastroesophageal reflux disease)    RARE  . Osteopenia     PAST SURGICAL HISTORY:   Past Surgical History:  Procedure Laterality Date  . COLONOSCOPY    . DILATION AND CURETTAGE OF UTERUS    . KIDNEY STONE SURGERY    . OPEN REDUCTION INTERNAL FIXATION (ORIF) DISTAL RADIAL FRACTURE Right 02/22/2016   Procedure: OPEN REDUCTION INTERNAL FIXATION (ORIF) DISTAL RADIAL FRACTURE;  Surgeon: Christena Flake, MD;  Location: [facility];  Service:  Orthopedics;  Laterality: Right;    SOCIAL HISTORY:   Social History   Tobacco Use  . Smoking status: Former Smoker    Packs/day: 1.00    Years: 4.00    Pack years: 4.00    Types: Cigarettes    Quit date: 02/20/1976    Years since quitting: 43.5  . Smokeless tobacco: Never Used  Substance Use Topics  . Alcohol use: Yes    Comment: RARE-WINE    FAMILY HISTORY:   Family History  Problem Relation Age of Onset  . CAD Mother   . CAD Father   . Cancer Brother   . Breast cancer Neg Hx     DRUG ALLERGIES:  No Known Allergies  REVIEW OF SYSTEMS:  CONSTITUTIONAL: Positive for fever chills and sweats.  Positive for fatigue.  EYES: No blurred or double vision.  EARS, NOSE, AND THROAT: No tinnitus or ear pain. No sore throat. RESPIRATORY: Positive for cough, no shortness of breath, wheezing or hemoptysis.  CARDIOVASCULAR: Slight chest pain likely from acid reflux.  No orthopnea, edema.  GASTROINTESTINAL: Positive for nausea, vomiting.  No diarrhea or abdominal pain. No blood in bowel movements GENITOURINARY: No dysuria, hematuria.  ENDOCRINE: No polyuria, nocturia,  HEMATOLOGY: No anemia, easy bruising or bleeding SKIN: No rash or lesion. MUSCULOSKELETAL: No joint pain or arthritis.   NEUROLOGIC: No tingling, numbness, weakness.  PSYCHIATRY: No anxiety or depression.  MEDICATIONS AT HOME:   Prior to Admission medications   Medication Sig Start Date End Date Taking? Authorizing Provider  calcium carbonate (CALTRATE 600) 1500 (600 Ca) MG TABS tablet Take 1,500 mg by mouth daily.   Yes [provider]  cholecalciferol (VITAMIN D) 1000 units tablet Take 5,000 Units by mouth daily.   Yes [provider]  Cranberry 1000 MG CAPS Take 1,000 mg by mouth daily.   Yes [provider]  doxycycline (VIBRAMYCIN) 100 MG capsule Take 100 mg by mouth 2 (two) times daily. 09/08/19  Yes [provider]  ibuprofen (ADVIL,MOTRIN) 200 MG tablet Take 200 mg by  mouth every 6 (six) hours as needed.   Yes [provider]  levofloxacin (LEVAQUIN) 500 MG tablet Take 500 mg by mouth daily. 09/11/19  Yes [provider]  Multiple Vitamin (MULTI-VITAMIN) tablet Take 1 tablet by mouth daily.    Yes [provider]  Omega-3 Fatty Acids (FISH OIL) 1000 MG CAPS Take 1,000 mg by mouth daily.    Yes [provider]  oseltamivir (TAMIFLU) 75 MG capsule Take 75 mg by mouth 2 (two) times daily. 09/08/19  Yes [provider]      VITAL SIGNS:  Blood pressure 134/80, pulse 93, temperature 98.3 F (36.8 C), temperature source Oral, resp. rate 16, height 5\' 8"  (1.727 m), weight 69.9 kg, SpO2 98 %.  PHYSICAL EXAMINATION:  GENERAL:  64 y.o.-year-old patient lying in the bed with no acute distress.  EYES: Pupils equal, round, reactive to light and accommodation. No scleral icterus. Extraocular muscles intact.  HEENT: Head atraumatic, normocephalic. Oropharynx and nasopharynx clear.  NECK:  Supple, no jugular venous distention. No thyroid enlargement, no tenderness.  LUNGS: Normal breath sounds bilaterally, no wheezing, rales,rhonchi or crepitation. No use of accessory muscles of respiration.  CARDIOVASCULAR: S1, S2 normal. No murmurs, rubs, or gallops.  ABDOMEN: Soft, nontender, nondistended. Bowel sounds present. No organomegaly or mass.  EXTREMITIES: No pedal edema, cyanosis, or clubbing.  NEUROLOGIC: Cranial nerves II through XII are intact. Muscle strength 5/5 in all extremities. Sensation intact. Gait not checked.  PSYCHIATRIC: The patient is alert and oriented x 3.  SKIN: No rash, lesion, or ulcer.   LABORATORY PANEL:   CBC Recent Labs  Lab 09/11/19 1535  WBC 21.4*  HGB 12.9  HCT 39.4  PLT 260   ------------------------------------------------------------------------------------------------------------------  Chemistries  Recent Labs  Lab 09/11/19 1535  NA 134*  K 3.5  CL 95*  CO2 25  GLUCOSE 95  BUN  30*  CREATININE 1.33*  CALCIUM 8.4*  AST 44*  ALT 76*  ALKPHOS 194*  BILITOT 0.8   ------------------------------------------------------------------------------------------------------------------  Cardiac Enzymes High-sensitivity troponin slightly elevated at 34.  RADIOLOGY:  CT CHEST W CONTRAST  Result Date: 09/11/2019 CLINICAL DATA:  Pneumonia. Atypical chest pain and epigastric pain. EXAM: CT CHEST, ABDOMEN, AND PELVIS WITH CONTRAST TECHNIQUE: Multidetector CT imaging of the chest, abdomen and pelvis was performed following the standard protocol during bolus administration of intravenous contrast. CONTRAST:  71mL OMNIPAQUE IOHEXOL 300 MG/ML  SOLN COMPARISON:  MRI abdomen 09/01/2019 and CT scan 06/18/2019 FINDINGS: CT CHEST FINDINGS Cardiovascular: The heart is upper limits of normal in size. No pericardial effusion. The aorta is normal in caliber. No dissection. No significant atherosclerotic calcifications. Branch vessels are patent. No definite coronary artery calcifications. The pulmonary arteries appear normal. Mediastinum/Nodes: No mediastinal or hilar mass or adenopathy. The esophagus is grossly normal. Lungs/Pleura: There are patchy vague ground-glass opacities in both lungs, right  greater than left. Findings could be due to atypical/viral pneumonia such as COVID pneumonia. Recommend clinical correlation. There is also streaky bibasilar subsegmental atelectasis and a very small left pleural effusion. No worrisome pulmonary lesions. Musculoskeletal: No breast masses, supraclavicular or axillary adenopathy. The thyroid gland appears normal. The bony structures are intact CT ABDOMEN PELVIS FINDINGS Hepatobiliary: Tiny low-attenuation lesion at the hepatic dome is likely a benign cyst. No worrisome hepatic lesions or intrahepatic biliary dilatation. The liver contour is slightly irregular and the hepatic parenchyma appears slightly heterogeneous. The hepatic fissures are slightly prominent  and findings suspicious for mild cirrhosis. The gallbladder is normal. Pancreas: No mass, inflammation or ductal dilatation. Spleen: Upper limits of normal in size. No splenic lesions. Adrenals/Urinary Tract: The adrenal glands are unremarkable. 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 There is a high-grade obstruction of the left kidney secondary to a 14 x 10 mm calculus in the mid left ureter located at the L4 vertebral body level. Severe hydroureteronephrosis and decreased perfusion of the left kidney. No distal ureteral calculi. No bladder calculi. The bladder is grossly normal. Its largely decompressed. Stomach/Bowel: The stomach, duodenum, small bowel and colon are grossly normal. No acute inflammatory changes, mass lesions or obstructive findings. The terminal ileum is normal. The appendix is normal. Fairly significant descending and sigmoid colon diverticulosis but no findings for acute diverticulitis. Vascular/Lymphatic: Scattered aortic calcifications but no aneurysm or dissection. The branch vessels are patent. The major venous structures are patent. Small scattered mesenteric and retroperitoneal lymph nodes but no mass or adenopathy. Reproductive: Enlarged fibroid uterus. Both ovaries appear normal. Other: No pelvic mass or adenopathy. No free pelvic fluid collections. No inguinal mass or adenopathy. No abdominal wall hernia or subcutaneous lesions. Musculoskeletal: No significant bony findings. Moderate degenerative changes involving the lumbar spine. IMPRESSION: 1. High-grade obstruction of the left kidney secondary to a 14 x 10 mm calculus in the mid left ureter at the L4 level. 2. 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 3. Patchy ground-glass opacities in both lungs could be due to atypical/viral pneumonia such as COVID pneumonia. There is also streaky bibasilar atelectasis and small left effusion. 4. No mediastinal or hilar mass  or adenopathy. 5. Suspect mild cirrhosis. 6. Enlarged fibroid uterus. Aortic Atherosclerosis (ICD10-I70.0). Electronically Signed   By: Rudie Meyer M.D.   On: 09/11/2019 14:20   CT ABDOMEN PELVIS W CONTRAST  Result Date: 09/11/2019 CLINICAL DATA:  Pneumonia. Atypical chest pain and epigastric pain. EXAM: CT CHEST, ABDOMEN, AND PELVIS WITH CONTRAST TECHNIQUE: Multidetector CT imaging of the chest, abdomen and pelvis was performed following the standard protocol during bolus administration of intravenous contrast. CONTRAST:  36mL OMNIPAQUE IOHEXOL 300 MG/ML  SOLN COMPARISON:  MRI abdomen 09/01/2019 and CT scan 06/18/2019 FINDINGS: CT CHEST FINDINGS Cardiovascular: The heart is upper limits of normal in size. No pericardial effusion. The aorta is normal in caliber. No dissection. No significant atherosclerotic calcifications. Branch vessels are patent. No definite coronary artery calcifications. The pulmonary arteries appear normal. Mediastinum/Nodes: No mediastinal or hilar mass or adenopathy. The esophagus is grossly normal. Lungs/Pleura: There are patchy vague ground-glass opacities in both lungs, right greater than left. Findings could be due to atypical/viral pneumonia such as COVID pneumonia. Recommend clinical correlation. There is also streaky bibasilar subsegmental atelectasis and a very small left pleural effusion. No worrisome pulmonary lesions. Musculoskeletal: No breast masses, supraclavicular or axillary adenopathy. The thyroid gland appears normal. The bony structures are intact CT ABDOMEN PELVIS FINDINGS Hepatobiliary: Tiny  low-attenuation lesion at the hepatic dome is likely a benign cyst. No worrisome hepatic lesions or intrahepatic biliary dilatation. The liver contour is slightly irregular and the hepatic parenchyma appears slightly heterogeneous. The hepatic fissures are slightly prominent and findings suspicious for mild cirrhosis. The gallbladder is normal. Pancreas: No mass, inflammation  or ductal dilatation. Spleen: Upper limits of normal in size. No splenic lesions. Adrenals/Urinary Tract: The adrenal glands are unremarkable. 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 There is a high-grade obstruction of the left kidney secondary to a 14 x 10 mm calculus in the mid left ureter located at the L4 vertebral body level. Severe hydroureteronephrosis and decreased perfusion of the left kidney. No distal ureteral calculi. No bladder calculi. The bladder is grossly normal. Its largely decompressed. Stomach/Bowel: The stomach, duodenum, small bowel and colon are grossly normal. No acute inflammatory changes, mass lesions or obstructive findings. The terminal ileum is normal. The appendix is normal. Fairly significant descending and sigmoid colon diverticulosis but no findings for acute diverticulitis. Vascular/Lymphatic: Scattered aortic calcifications but no aneurysm or dissection. The branch vessels are patent. The major venous structures are patent. Small scattered mesenteric and retroperitoneal lymph nodes but no mass or adenopathy. Reproductive: Enlarged fibroid uterus. Both ovaries appear normal. Other: No pelvic mass or adenopathy. No free pelvic fluid collections. No inguinal mass or adenopathy. No abdominal wall hernia or subcutaneous lesions. Musculoskeletal: No significant bony findings. Moderate degenerative changes involving the lumbar spine. IMPRESSION: 1. High-grade obstruction of the left kidney secondary to a 14 x 10 mm calculus in the mid left ureter at the L4 level. 2. 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 3. Patchy ground-glass opacities in both lungs could be due to atypical/viral pneumonia such as COVID pneumonia. There is also streaky bibasilar atelectasis and small left effusion. 4. No mediastinal or hilar mass or adenopathy. 5. Suspect mild cirrhosis. 6. Enlarged fibroid uterus. Aortic Atherosclerosis  (ICD10-I70.0). Electronically Signed   By: Marijo Sanes M.D.   On: 09/11/2019 14:20    EKG:   Normal sinus rhythm 92 bpm  IMPRESSION AND PLAN:   1.  Clinical sepsis, present on admission, from large left ureteral obstructing kidney stone with acute kidney injury.  Leukocytosis and fever as outpatient.  Outpatient urine culture did show E. coli which was sensitive to Rocephin.  IV Rocephin here.  Will add Zithromax since CT scan of the chest showed pneumonia also.  Covid test pending but previous Covid tests are negative.  Urology to take to the operating room for stenting procedure this evening.  Procedure must be done because the patient is already septic.  Follow-up blood cultures and urine culture.  2.  Acute kidney injury with dehydration and obstructing kidney stone.  Creatinine up at 1.33.  Gentle IV fluid hydration 3.  Elevated troponin this is demand ischemia from sepsis.  No further work-up. 4.  Heartburn.  Given IV Protonix in ER will switch over to p.o. for tomorrow. 5.  Hypokalemia replace potassium and IV fluids  All the records, radiological and laboratory data are reviewed and case discussed with ED provider. Management plans discussed with the patient, and she is in in agreement.  CODE STATUS: Full code  TOTAL TIME TAKING CARE OF THIS PATIENT: 50 minutes.    Loletha Grayer M.D on 09/11/2019 at 6:05 PM  Between 7am to 6pm - Pager - [phone]  After 6pm call admission pager [phone]  [facility]  CC: Primary care physician; Tama High III,  MD

## 2019-09-11 NOTE — ED Notes (Signed)
IV access obtained successfully. However, this RN and Gabby, EDT both unable to obtain blood.

## 2019-09-11 NOTE — ED Notes (Signed)
Able to obtain one set of blood cultures before abx but due to patient blood being unable to flow back, this nurse was unable to obtain other blood or cultures. Dr. Scotty Court at bedsdie getting IV with ultrasound at this time.

## 2019-09-11 NOTE — ED Notes (Signed)
Spoke with Dr. Erma Heritage about patient. Verbal order given for urine and culture, blood cultures x 2 and lactic acid.

## 2019-09-11 NOTE — ED Triage Notes (Signed)
Pt to ED via POV. Pt has multiple medical issues currently. Pt has UTI, culture grew out E Coli. Pt also has elevated LFTs, and elevated WBC. Pt also states that she just had a CT scan done and was told that she had 14 mm stone. Pt states that she has been running fever but has not had one today. Pt denies currently being in pain.

## 2019-09-11 NOTE — Anesthesia Procedure Notes (Signed)
Procedure Name: LMA Insertion Date/Time: 09/11/2019 7:05 PM Performed by: Clyde Lundborg, CRNA Pre-anesthesia Checklist: Patient identified, Emergency Drugs available, Suction available and Patient being monitored Patient Re-evaluated:Patient Re-evaluated prior to induction Oxygen Delivery Method: Circle system utilized Preoxygenation: Pre-oxygenation with 100% oxygen Induction Type: IV induction Ventilation: Mask ventilation without difficulty LMA: LMA inserted LMA Size: 4.0 Tube type: Oral Number of attempts: 1 Airway Equipment and Method: Oral airway Placement Confirmation: ETT inserted through vocal cords under direct vision,  positive ETCO2,  breath sounds checked- equal and bilateral and CO2 detector Tube secured with: Tape Dental Injury: Teeth and Oropharynx as per pre-operative assessment

## 2019-09-12 DIAGNOSIS — U071 COVID-19: Secondary | ICD-10-CM

## 2019-09-12 DIAGNOSIS — A419 Sepsis, unspecified organism: Secondary | ICD-10-CM

## 2019-09-12 DIAGNOSIS — N390 Urinary tract infection, site not specified: Secondary | ICD-10-CM

## 2019-09-12 DIAGNOSIS — J1282 Pneumonia due to coronavirus disease 2019: Secondary | ICD-10-CM

## 2019-09-12 LAB — CBC
HCT: 33.6 % — ABNORMAL LOW (ref 36.0–46.0)
Hemoglobin: 11.2 g/dL — ABNORMAL LOW (ref 12.0–15.0)
MCH: 29.4 pg (ref 26.0–34.0)
MCHC: 33.3 g/dL (ref 30.0–36.0)
MCV: 88.2 fL (ref 80.0–100.0)
Platelets: 268 10*3/uL (ref 150–400)
RBC: 3.81 MIL/uL — ABNORMAL LOW (ref 3.87–5.11)
RDW: 14.1 % (ref 11.5–15.5)
WBC: 12 10*3/uL — ABNORMAL HIGH (ref 4.0–10.5)
nRBC: 0 % (ref 0.0–0.2)

## 2019-09-12 LAB — BASIC METABOLIC PANEL
Anion gap: 10 (ref 5–15)
BUN: 31 mg/dL — ABNORMAL HIGH (ref 8–23)
CO2: 22 mmol/L (ref 22–32)
Calcium: 7.3 mg/dL — ABNORMAL LOW (ref 8.9–10.3)
Chloride: 105 mmol/L (ref 98–111)
Creatinine, Ser: 1.18 mg/dL — ABNORMAL HIGH (ref 0.44–1.00)
GFR calc Af Amer: 58 mL/min — ABNORMAL LOW (ref >60)
GFR calc non Af Amer: 50 mL/min — ABNORMAL LOW (ref >60)
Glucose, Bld: 166 mg/dL — ABNORMAL HIGH (ref 70–99)
Potassium: 4.4 mmol/L (ref 3.5–5.1)
Sodium: 137 mmol/L (ref 135–145)

## 2019-09-12 LAB — ABO/RH: ABO/RH(D): A POS

## 2019-09-12 LAB — FIBRIN DERIVATIVES D-DIMER (ARMC ONLY): Fibrin derivatives D-dimer (ARMC): 1968.04 ng/mL (FEU) — ABNORMAL HIGH (ref 0.00–499.00)

## 2019-09-12 LAB — C-REACTIVE PROTEIN: CRP: 30.8 mg/dL — ABNORMAL HIGH (ref <1.0)

## 2019-09-12 LAB — FERRITIN: Ferritin: 433 ng/mL — ABNORMAL HIGH (ref 11–307)

## 2019-09-12 LAB — HIV ANTIBODY (ROUTINE TESTING W REFLEX): HIV Screen 4th Generation wRfx: NONREACTIVE

## 2019-09-12 MED ORDER — NON FORMULARY: 5.0000 mg | Freq: Every evening | Status: DC | PRN

## 2019-09-12 MED ORDER — SODIUM CHLORIDE 0.9 % IV SOLN: INTRAVENOUS | Status: DC

## 2019-09-12 MED ORDER — MELATONIN 5 MG PO TABS
5.0000 mg | ORAL_TABLET | Freq: Every evening | ORAL | Status: DC | PRN
  Administered 2019-09-12 (×2): 5 mg via ORAL
  Filled 2019-09-12 (×3): qty 1

## 2019-09-12 MED ORDER — DEXAMETHASONE SODIUM PHOSPHATE 10 MG/ML IJ SOLN
6.0000 mg | INTRAMUSCULAR | Status: DC
  Administered 2019-09-13: 6 mg via INTRAVENOUS
  Filled 2019-09-12: qty 1

## 2019-09-12 MED ORDER — CHLORHEXIDINE GLUCONATE CLOTH 2 % EX PADS
6.0000 | MEDICATED_PAD | Freq: Every day | CUTANEOUS | Status: DC
  Administered 2019-09-12: 6 via TOPICAL

## 2019-09-12 MED ORDER — TAMSULOSIN HCL 0.4 MG PO CAPS
0.4000 mg | ORAL_CAPSULE | Freq: Every day | ORAL | Status: DC
  Administered 2019-09-12 – 2019-09-13 (×2): 0.4 mg via ORAL
  Filled 2019-09-12 (×2): qty 1

## 2019-09-12 MED ORDER — HYDROCORTISONE (PERIANAL) 2.5 % EX CREA
TOPICAL_CREAM | Freq: Two times a day (BID) | CUTANEOUS | Status: DC | PRN
  Filled 2019-09-12: qty 28.35

## 2019-09-12 MED ORDER — ENOXAPARIN SODIUM 40 MG/0.4ML ~~LOC~~ SOLN
40.0000 mg | SUBCUTANEOUS | Status: DC
  Administered 2019-09-12: 21:00:00 40 mg via SUBCUTANEOUS
  Filled 2019-09-12: qty 0.4

## 2019-09-12 NOTE — Plan of Care (Signed)
  Problem: Education: Goal: Knowledge of risk factors and measures for prevention of condition will improve Outcome: Progressing   Problem: Coping: Goal: Psychosocial and spiritual needs will be supported Outcome: Progressing   Problem: Respiratory: Goal: Will maintain a patent airway Outcome: Progressing Goal: Complications related to the disease process, condition or treatment will be avoided or minimized Outcome: Progressing   

## 2019-09-12 NOTE — Progress Notes (Addendum)
   Subjective No complaints this morning, feels well  Afebrile overnight Denies any flank pain or shortness of breath  Physical Exam: BP (!) 121/93 (BP Location: Left Arm)   Pulse (!) 101   Temp 98.3 F (36.8 C)   Resp 18   Ht 5\' 8"  (1.727 m)   Wt 69.9 kg   SpO2 100%   BMI 23.42 kg/m    Constitutional:  Alert and oriented, No acute distress. Respiratory: Normal respiratory effort, no increased work of breathing. GI: Abdomen is soft, non-tender, non-distended GU: No CVA tenderness Drains: Urine clear yellow per Foley  Laboratory Data: Reviewed Leukocytosis improved to 12K this morning from 21K  Assessment & Plan:   In summary, the patient is a 64 year old female with acute Covid infection as well as an infected 14 mm left proximal ureteral stone with E. coli sepsis from urinary source. POD#1 cystoscopy and left ureteral stent placement.  Urine culture from care everywhere dated 09/08/2019 with E. coli UTI sensitive to cefazolin, Augmentin, ceftriaxone, Zosyn, and Bactrim.  She is doing well this morning with no complaints.  I had a long conversation with the patient today about the plan moving forward.  We discussed the need for definitive management with ureteroscopy in 3 to 4 weeks when recovered from infection and Covid.  We discussed the risks and benefits including bleeding, infection, ureteral injury, possible need for additional procedures in the future.  We also discussed stent related symptoms of dysuria, urgency, frequency, and flank pain.  Recommendations: 2 weeks Bactrim DS twice daily Okay to remove Foley Urology will arrange outpatient ureteroscopy/laser lithotripsy for management of all left-sided stones in 3 to 4 weeks Consider Flomax 0.4 mg nightly, oxybutynin 10 mg XL as needed for bladder spasms, and NSAIDs for any flank pain from stent  A total of 25 minutes was spent on the floor with greater than 50% spent in counseling and coordination of care regarding 14  mm left proximal ureteral stone with sepsis from urinary source and COVID-19 infection.  11/06/2019, MD

## 2019-09-12 NOTE — Progress Notes (Signed)
Patient alert and oriented, VSS. NO complaints of pain. Foley removed without difficulty. Patient ambulatory to bathroom.

## 2019-09-12 NOTE — Discharge Instructions (Signed)
You are scheduled for an outpatient infusion of Remdesivir at 10:00 AM on Monday 2/15 and Tuesday 2/16.  Please report to [facility] at [address].  Drive to the security guard and tell them you are here for an infusion. They will direct you to the front entrance where we will come and get you.  For questions call [phone].  Thanks

## 2019-09-12 NOTE — Progress Notes (Signed)
Patient scheduled for outpatient Remdesivir infusion at 10:00 AM on Monday 2/15 and Tuesday 2/16.  Please advise them to report to [facility] at [address].  Drive to the security guard and tell them you are here for an infusion. They will direct you to the front entrance where we will come and get you.  For questions call [phone].  Thanks

## 2019-09-12 NOTE — Progress Notes (Signed)
[facility]  - [place] at [facility]   PATIENT NAME: Courtney Cochran    MR#:  [id number]  DATE OF BIRTH:  1956/03/19  SUBJECTIVE:  came in with high-grade fever at home along with headache and severe heartburn. She was found to have UTI secondary to recurrent stone-- underwent cystoscopy with stent placement  feels better this morning. No fever no abdominal pain no back pain. Tolerating PO diet. Denies shortness of breath or cough. No headache  REVIEW OF SYSTEMS:   Review of Systems  Constitutional: Negative for chills, fever and weight loss.  HENT: Negative for ear discharge, ear pain and nosebleeds.   Eyes: Negative for blurred vision, pain and discharge.  Respiratory: Negative for sputum production, shortness of breath, wheezing and stridor.   Cardiovascular: Negative for chest pain, palpitations, orthopnea and PND.  Gastrointestinal: Negative for abdominal pain, diarrhea, nausea and vomiting.  Genitourinary: Negative for frequency and urgency.  Musculoskeletal: Negative for back pain and joint pain.  Neurological: Negative for sensory change, speech change, focal weakness and weakness.  Psychiatric/Behavioral: Negative for depression and hallucinations. The patient is not nervous/anxious.    Tolerating Diet:yes Tolerating PT: ambulatory  DRUG ALLERGIES:  No Known Allergies  VITALS:  Blood pressure (!) 121/93, pulse (!) 101, temperature 98.1 F (36.7 C), temperature source Oral, resp. rate 18, height 5\' 8"  (1.727 m), weight 69.9 kg, SpO2 100 %.  PHYSICAL EXAMINATION:   Physical Exam  GENERAL:  64 y.o.-year-old patient lying in the bed with no acute distress.  EYES: Pupils equal, round, reactive to light and accommodation. No scleral icterus.   HEENT: Head atraumatic, normocephalic. Oropharynx and nasopharynx clear.  NECK:  Supple, no jugular venous distention. No thyroid enlargement, no tenderness.  LUNGS: Normal breath sounds bilaterally, no  wheezing, rales, rhonchi. No use of accessory muscles of respiration.  CARDIOVASCULAR: S1, S2 normal. No murmurs, rubs, or gallops.  ABDOMEN: Soft, nontender, nondistended. Bowel sounds present. No organomegaly or mass.  EXTREMITIES: No cyanosis, clubbing or edema b/l.    NEUROLOGIC: Cranial nerves II through XII are intact. No focal Motor or sensory deficits b/l.   PSYCHIATRIC:  patient is alert and oriented x 3.  SKIN: No obvious rash, lesion, or ulcer.   LABORATORY PANEL:  CBC Recent Labs  Lab 09/12/19 0632  WBC 12.0*  HGB 11.2*  HCT 33.6*  PLT 268    Chemistries  Recent Labs  Lab 09/11/19 1535 09/11/19 1535 09/12/19 0632  NA 134*   < > 137  K 3.5   < > 4.4  CL 95*   < > 105  CO2 25   < > 22  GLUCOSE 95   < > 166*  BUN 30*   < > 31*  CREATININE 1.33*   < > 1.18*  CALCIUM 8.4*   < > 7.3*  AST 44*  --   --   ALT 76*  --   --   ALKPHOS 194*  --   --   BILITOT 0.8  --   --    < > = values in this interval not displayed.   Cardiac Enzymes No results for input(s): TROPONINI in the last 168 hours. RADIOLOGY:  CT CHEST W CONTRAST  Result Date: 09/11/2019 CLINICAL DATA:  Pneumonia. Atypical chest pain and epigastric pain. EXAM: CT CHEST, ABDOMEN, AND PELVIS WITH CONTRAST TECHNIQUE: Multidetector CT imaging of the chest, abdomen and pelvis was performed following the standard protocol during bolus administration of intravenous contrast. CONTRAST:  28mL OMNIPAQUE  IOHEXOL 300 MG/ML  SOLN COMPARISON:  MRI abdomen 09/01/2019 and CT scan 06/18/2019 FINDINGS: CT CHEST FINDINGS Cardiovascular: The heart is upper limits of normal in size. No pericardial effusion. The aorta is normal in caliber. No dissection. No significant atherosclerotic calcifications. Branch vessels are patent. No definite coronary artery calcifications. The pulmonary arteries appear normal. Mediastinum/Nodes: No mediastinal or hilar mass or adenopathy. The esophagus is grossly normal. Lungs/Pleura: There are patchy  vague ground-glass opacities in both lungs, right greater than left. Findings could be due to atypical/viral pneumonia such as COVID pneumonia. Recommend clinical correlation. There is also streaky bibasilar subsegmental atelectasis and a very small left pleural effusion. No worrisome pulmonary lesions. Musculoskeletal: No breast masses, supraclavicular or axillary adenopathy. The thyroid gland appears normal. The bony structures are intact CT ABDOMEN PELVIS FINDINGS Hepatobiliary: Tiny low-attenuation lesion at the hepatic dome is likely a benign cyst. No worrisome hepatic lesions or intrahepatic biliary dilatation. The liver contour is slightly irregular and the hepatic parenchyma appears slightly heterogeneous. The hepatic fissures are slightly prominent and findings suspicious for mild cirrhosis. The gallbladder is normal. Pancreas: No mass, inflammation or ductal dilatation. Spleen: Upper limits of normal in size. No splenic lesions. Adrenals/Urinary Tract: The adrenal glands are unremarkable. 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 There is a high-grade obstruction of the left kidney secondary to a 14 x 10 mm calculus in the mid left ureter located at the L4 vertebral body level. Severe hydroureteronephrosis and decreased perfusion of the left kidney. No distal ureteral calculi. No bladder calculi. The bladder is grossly normal. Its largely decompressed. Stomach/Bowel: The stomach, duodenum, small bowel and colon are grossly normal. No acute inflammatory changes, mass lesions or obstructive findings. The terminal ileum is normal. The appendix is normal. Fairly significant descending and sigmoid colon diverticulosis but no findings for acute diverticulitis. Vascular/Lymphatic: Scattered aortic calcifications but no aneurysm or dissection. The branch vessels are patent. The major venous structures are patent. Small scattered mesenteric and retroperitoneal lymph nodes but no  mass or adenopathy. Reproductive: Enlarged fibroid uterus. Both ovaries appear normal. Other: No pelvic mass or adenopathy. No free pelvic fluid collections. No inguinal mass or adenopathy. No abdominal wall hernia or subcutaneous lesions. Musculoskeletal: No significant bony findings. Moderate degenerative changes involving the lumbar spine. IMPRESSION: 1. High-grade obstruction of the left kidney secondary to a 14 x 10 mm calculus in the mid left ureter at the L4 level. 2. 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 3. Patchy ground-glass opacities in both lungs could be due to atypical/viral pneumonia such as COVID pneumonia. There is also streaky bibasilar atelectasis and small left effusion. 4. No mediastinal or hilar mass or adenopathy. 5. Suspect mild cirrhosis. 6. Enlarged fibroid uterus. Aortic Atherosclerosis (ICD10-I70.0). Electronically Signed   By: Marijo Sanes M.D.   On: 09/11/2019 14:20   CT ABDOMEN PELVIS W CONTRAST  Result Date: 09/11/2019 CLINICAL DATA:  Pneumonia. Atypical chest pain and epigastric pain. EXAM: CT CHEST, ABDOMEN, AND PELVIS WITH CONTRAST TECHNIQUE: Multidetector CT imaging of the chest, abdomen and pelvis was performed following the standard protocol during bolus administration of intravenous contrast. CONTRAST:  10mL OMNIPAQUE IOHEXOL 300 MG/ML  SOLN COMPARISON:  MRI abdomen 09/01/2019 and CT scan 06/18/2019 FINDINGS: CT CHEST FINDINGS Cardiovascular: The heart is upper limits of normal in size. No pericardial effusion. The aorta is normal in caliber. No dissection. No significant atherosclerotic calcifications. Branch vessels are patent. No definite coronary artery calcifications. The pulmonary arteries appear normal. Mediastinum/Nodes: No  mediastinal or hilar mass or adenopathy. The esophagus is grossly normal. Lungs/Pleura: There are patchy vague ground-glass opacities in both lungs, right greater than left. Findings could be due to  atypical/viral pneumonia such as COVID pneumonia. Recommend clinical correlation. There is also streaky bibasilar subsegmental atelectasis and a very small left pleural effusion. No worrisome pulmonary lesions. Musculoskeletal: No breast masses, supraclavicular or axillary adenopathy. The thyroid gland appears normal. The bony structures are intact CT ABDOMEN PELVIS FINDINGS Hepatobiliary: Tiny low-attenuation lesion at the hepatic dome is likely a benign cyst. No worrisome hepatic lesions or intrahepatic biliary dilatation. The liver contour is slightly irregular and the hepatic parenchyma appears slightly heterogeneous. The hepatic fissures are slightly prominent and findings suspicious for mild cirrhosis. The gallbladder is normal. Pancreas: No mass, inflammation or ductal dilatation. Spleen: Upper limits of normal in size. No splenic lesions. Adrenals/Urinary Tract: The adrenal glands are unremarkable. 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 There is a high-grade obstruction of the left kidney secondary to a 14 x 10 mm calculus in the mid left ureter located at the L4 vertebral body level. Severe hydroureteronephrosis and decreased perfusion of the left kidney. No distal ureteral calculi. No bladder calculi. The bladder is grossly normal. Its largely decompressed. Stomach/Bowel: The stomach, duodenum, small bowel and colon are grossly normal. No acute inflammatory changes, mass lesions or obstructive findings. The terminal ileum is normal. The appendix is normal. Fairly significant descending and sigmoid colon diverticulosis but no findings for acute diverticulitis. Vascular/Lymphatic: Scattered aortic calcifications but no aneurysm or dissection. The branch vessels are patent. The major venous structures are patent. Small scattered mesenteric and retroperitoneal lymph nodes but no mass or adenopathy. Reproductive: Enlarged fibroid uterus. Both ovaries appear normal. Other: No  pelvic mass or adenopathy. No free pelvic fluid collections. No inguinal mass or adenopathy. No abdominal wall hernia or subcutaneous lesions. Musculoskeletal: No significant bony findings. Moderate degenerative changes involving the lumbar spine. IMPRESSION: 1. High-grade obstruction of the left kidney secondary to a 14 x 10 mm calculus in the mid left ureter at the L4 level. 2. 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 3. Patchy ground-glass opacities in both lungs could be due to atypical/viral pneumonia such as COVID pneumonia. There is also streaky bibasilar atelectasis and small left effusion. 4. No mediastinal or hilar mass or adenopathy. 5. Suspect mild cirrhosis. 6. Enlarged fibroid uterus. Aortic Atherosclerosis (ICD10-I70.0). Electronically Signed   By: Rudie Meyer M.D.   On: 09/11/2019 14:20   DG OR UROLOGY CYSTO IMAGE ([facility] ONLY)  Result Date: 09/11/2019 There is no interpretation for this exam.  This order is for images obtained during a surgical procedure.  Please See "Surgeries" Tab for more information regarding the procedure.   ASSESSMENT AND PLAN:  Shanti Agresti  is a 64 y.o. female has been battling a urinary infection as outpatient.  She has been taking antibiotics and spiking some high fevers.  She states she broke her fever on Wednesday.  She has been having no appetite and she has been dehydrated.  1.  Clinical sepsis, present on admission-- improved from large left ureteral obstructing kidney stone with acute kidney injury and UTI -Came in with leukocytosis and high-grade fever as outpatient. -  Outpatient urine culture did show E. coli which was sensitive to Rocephin.  IV Rocephin here.   -status post cystoscopy with left ureteral stent placement by Dr. Gabrielle Dare -blood culture negative -urine culture pending -patient is afebrile--- white count trending down-- lactic acid  within normal limits -creatinine improving  2.  Acute kidney  injury with dehydration and obstructing kidney stone.  Creatinine up at 1.33.  Gentle IV fluid hydration--1.1 -per pt making good urine  3.  Elevated troponin this is demand ischemia from sepsis.  No further work-up.  4.  COVID-19 Pneumonia -on iv remdesivir (2/5), decadron -no shortness of breath -CT chest showed bilateral pneumonia  5.  Hypokalemia replace potassium and IV fluids -potassium 4.4  6. Acid reflux continue PPI   Procedures: left ureteral stent placement Family communication : patient Consults : urology Discharge Disposition : home tomorrow if remains stable, patient will complete [facility] infusions at [facility] CODE STATUS: full DVT Prophylaxis : Lovenox Barriers to discharge: none  TOTAL TIME TAKING CARE OF THIS PATIENT: *25* minutes.  >50% time spent on counselling and coordination of care  Note: This dictation was prepared with Dragon dictation along with smaller phrase technology. Any transcriptional errors that result from this process are unintentional.  Enedina Finner M.D    [facility]   CC: Primary care physician; Lynnea Ferrier, MDPatient ID: Courtney Cochran, female   DOB: 03-20-1956, 64 y.o.   MRN: [id number]

## 2019-09-12 NOTE — Progress Notes (Signed)
Patient ambulating around nurses station without difficulty between care. NO complaints or distress at this time.

## 2019-09-13 LAB — FERRITIN: Ferritin: 390 ng/mL — ABNORMAL HIGH (ref 11–307)

## 2019-09-13 LAB — C-REACTIVE PROTEIN: CRP: 19.5 mg/dL — ABNORMAL HIGH (ref <1.0)

## 2019-09-13 LAB — FIBRIN DERIVATIVES D-DIMER (ARMC ONLY): Fibrin derivatives D-dimer (ARMC): 1750.15 ng/mL (FEU) — ABNORMAL HIGH (ref 0.00–499.00)

## 2019-09-13 MED ORDER — HYDROCORTISONE (PERIANAL) 2.5 % EX CREA: TOPICAL_CREAM | Freq: Two times a day (BID) | CUTANEOUS | 0 refills | Status: DC | PRN

## 2019-09-13 MED ORDER — CEFDINIR 300 MG PO CAPS: 300.0000 mg | ORAL_CAPSULE | Freq: Two times a day (BID) | ORAL | Status: DC

## 2019-09-13 MED ORDER — TAMSULOSIN HCL 0.4 MG PO CAPS: 0.4000 mg | ORAL_CAPSULE | Freq: Every day | ORAL | 0 refills | Status: DC

## 2019-09-13 MED ORDER — CEFDINIR 300 MG PO CAPS: 300.0000 mg | ORAL_CAPSULE | Freq: Two times a day (BID) | ORAL | 0 refills | Status: AC

## 2019-09-13 MED ORDER — DEXAMETHASONE 6 MG PO TABS: 6.0000 mg | ORAL_TABLET | Freq: Every day | ORAL | 0 refills | Status: AC

## 2019-09-13 NOTE — Discharge Summary (Signed)
[facility] [place] [facility]   PATIENT NAME: Courtney Cochran    MR#:  [id number]  DATE OF BIRTH:  [birth year]  DATE OF ADMISSION:  09/11/2019 ADMITTING PHYSICIAN: Alford Highland, MD  DATE OF DISCHARGE: 09/13/2019  PRIMARY CARE PHYSICIAN: Lynnea Ferrier, MD    ADMISSION DIAGNOSIS:  Sepsis (HCC) [A41.9] Urinary tract infection without hematuria, site unspecified [N39.0] Obstruction of left ureter [N13.5] COVID-19 virus infection [U07.1] Pneumonia due to COVID-19 virus [U07.1, J12.82]  DISCHARGE DIAGNOSIS:  sepsis on admission-- resolved acute renal failure with dehydration/prerenal azotemia-- improving left large ureteral stone status post ureteral stent placement recurrent E. coli UTI COVID-19 pneumonia SECONDARY DIAGNOSIS:   Past Medical History:  Diagnosis Date  . Anemia    H/O 18 YEARS AGO  . Chronic kidney disease    STONES  . Family history of adverse reaction to anesthesia    PTS SISTER HAS WOKEN UP TWICE DURING SURGERY  . Foot injury    LEFT-FOOT BRUISED AND SWOLLEN PER PT(RELATED TO HER FALL THAT BROKE HER WRIST)  . GERD (gastroesophageal reflux disease)    RARE  . Osteopenia     HOSPITAL COURSE:   ElizabethHarringtonis a63 y.o.femalehas been battling a urinary infection as outpatient. She has been taking antibiotics and spiking some high fevers. She states she broke her fever on Wednesday. She has been having no appetite and she has been dehydrated.  1.Clinical sepsis, present on admission-- improved source large left ureteral obstructing kidney stone with acute kidney injury and recurrent E coli UTI -Came in with leukocytosis, tachycardia and high-grade fever as outpatient. -Outpatient urine culture few times in the past (most recent jan 5th)  did show E. coli which was sensitive to Rocephin. IV Rocephin here--change to po cefdinir (total 14 days)--covers UTI and prevention of  superinfection from COIVD -status  post cystoscopy with left ureteral stent placement by Dr. Manfred Arch 2 weeks for possible stone rx -blood culture negative -urine culture 40 k GNR -patient is afebrile--- white count trending down-- lactic acid within normal limits -creatinine improving -CRP 30.6--19.0 (Sepsis and Covid 19)  2. Acute kidney injury with dehydration and obstructing kidney stone. Creatinine up at 1.33.  -received Gentle IV fluid hydration--1.1 -per pt making good urine  3.Elevated troponin this is demand ischemia from sepsis. No further work-up. -HR 80's  4. COVID-19 Pneumonia -on iv remdesivir (3/5), decadron--pt will finish remaining doses at [facility] --appt made -no shortness of breath, sats >92 % on RA -CT chest showed bilateral pneumonia  5. Hypokalemia replace potassium and IV fluids -potassium 4.4  6. Acid reflux continue PPI  Patient is hemodynamically stable. She feels at baseline. She is agreeable for discharge and follow-up outpatient neurology and complete infusions at [facility]   Procedures: left ureteral stent placement Family communication : patient Consults : urology Discharge Disposition : home  CODE STATUS: full DVT Prophylaxis : Lovenox Barriers to discharge: none CONSULTS OBTAINED:  Treatment Team:  Sondra Come, MD  DRUG ALLERGIES:  No Known Allergies  DISCHARGE MEDICATIONS:   Allergies as of 09/13/2019   No Known Allergies     Medication List    STOP taking these medications   doxycycline 100 MG capsule Commonly known as: VIBRAMYCIN   levofloxacin 500 MG tablet Commonly known as: LEVAQUIN   oseltamivir 75 MG capsule Commonly known as: TAMIFLU     TAKE these medications   Caltrate 600 1500 (600 Ca) MG Tabs tablet Generic drug: calcium carbonate Take 1,500 mg by  mouth daily.   cefdinir 300 MG capsule Commonly known as: OMNICEF Take 1 capsule (300 mg total) by mouth every 12 (twelve) hours for 11 days. Start taking on: September 14, 2019    cholecalciferol 1000 units tablet Commonly known as: VITAMIN D Take 5,000 Units by mouth daily.   Cranberry 1000 MG Caps Take 1,000 mg by mouth daily.   dexamethasone 6 MG tablet Commonly known as: DECADRON Take 1 tablet (6 mg total) by mouth daily for 4 days.   Fish Oil 1000 MG Caps Take 1,000 mg by mouth daily.   hydrocortisone 2.5 % rectal cream Commonly known as: ANUSOL-HC Place rectally 2 (two) times daily as needed for hemorrhoids or anal itching.   ibuprofen 200 MG tablet Commonly known as: ADVIL Take 200 mg by mouth every 6 (six) hours as needed.   Multi-Vitamin tablet Take 1 tablet by mouth daily.   tamsulosin 0.4 MG Caps capsule Commonly known as: FLOMAX Take 1 capsule (0.4 mg total) by mouth daily. Start taking on: September 14, 2019       If you experience worsening of your admission symptoms, develop shortness of breath, life threatening emergency, suicidal or homicidal thoughts you must seek medical attention immediately by calling 911 or calling your MD immediately  if symptoms less severe.  You Must read complete instructions/literature along with all the possible adverse reactions/side effects for all the Medicines you take and that have been prescribed to you. Take any new Medicines after you have completely understood and accept all the possible adverse reactions/side effects.   Please note  You were cared for by a hospitalist during your hospital stay. If you have any questions about your discharge medications or the care you received while you were in the hospital after you are discharged, you can call the unit and asked to speak with the hospitalist on call if the hospitalist that took care of you is not available. Once you are discharged, your primary care physician will handle any further medical issues. Please note that NO REFILLS for any discharge medications will be authorized once you are discharged, as it is imperative that you return to your  primary care physician (or establish a relationship with a primary care physician if you do not have one) for your aftercare needs so that they can reassess your need for medications and monitor your lab values. Today   SUBJECTIVE   Doing well. No new complaints  VITAL SIGNS:  Blood pressure 122/84, pulse 88, temperature 98.1 F (36.7 C), temperature source Oral, resp. rate 17, height 5\' 8"  (1.727 m), weight 69.9 kg, SpO2 95 %.  I/O:    Intake/Output Summary (Last 24 hours) at 09/13/2019 1044 Last data filed at 09/13/2019 0900 Gross per 24 hour  Intake 753.97 ml  Output --  Net 753.97 ml    PHYSICAL EXAMINATION:  GENERAL:  64 y.o.-year-old patient lying in the bed with no acute distress.  EYES: Pupils equal, round, reactive to light and accommodation. No scleral icterus.  HEENT: Head atraumatic, normocephalic. Oropharynx and nasopharynx clear.  NECK:  Supple, no jugular venous distention. No thyroid enlargement, no tenderness.  LUNGS: Normal breath sounds bilaterally, no wheezing, rales,rhonchi or crepitation. No use of accessory muscles of respiration.  CARDIOVASCULAR: S1, S2 normal. No murmurs, rubs, or gallops.  ABDOMEN: Soft, non-tender, non-distended. Bowel sounds present. No organomegaly or mass.  EXTREMITIES: No pedal edema, cyanosis, or clubbing.  NEUROLOGIC: Cranial nerves II through XII are intact. Muscle strength 5/5 in all  extremities. Sensation intact. Gait not checked.  PSYCHIATRIC: The patient is alert and oriented x 3.  SKIN: No obvious rash, lesion, or ulcer.   DATA REVIEW:   CBC  Recent Labs  Lab 09/12/19 0632  WBC 12.0*  HGB 11.2*  HCT 33.6*  PLT 268    Chemistries  Recent Labs  Lab 09/11/19 1535 09/11/19 1535 09/12/19 0632  NA 134*   < > 137  K 3.5   < > 4.4  CL 95*   < > 105  CO2 25   < > 22  GLUCOSE 95   < > 166*  BUN 30*   < > 31*  CREATININE 1.33*   < > 1.18*  CALCIUM 8.4*   < > 7.3*  AST 44*  --   --   ALT 76*  --   --   ALKPHOS  194*  --   --   BILITOT 0.8  --   --    < > = values in this interval not displayed.    Microbiology Results   Recent Results (from the past 240 hour(s))  Blood culture (routine x 2)     Status: None (Preliminary result)   Collection Time: 09/11/19  3:31 PM   Specimen: BLOOD  Result Value Ref Range Status   Specimen Description BLOOD RIGHT ANTECUBITAL  Final   Special Requests   Final    BOTTLES DRAWN AEROBIC AND ANAEROBIC Blood Culture results may not be optimal due to an inadequate volume of blood received in culture bottles   Culture   Final    NO GROWTH 2 DAYS Performed at [facility], [address] [place], [place] [address]    Report Status PENDING  Incomplete  Blood culture (routine x 2)     Status: None (Preliminary result)   Collection Time: 09/11/19  3:31 PM   Specimen: BLOOD  Result Value Ref Range Status   Specimen Description BLOOD LEFT ANTECUBITAL  Final   Special Requests   Final    BOTTLES DRAWN AEROBIC AND ANAEROBIC Blood Culture results may not be optimal due to an inadequate volume of blood received in culture bottles   Culture   Final    NO GROWTH 2 DAYS Performed at [facility], [address], [place], [place] [address]    Report Status PENDING  Incomplete  Urine culture     Status: Abnormal (Preliminary result)   Collection Time: 09/11/19  3:33 PM   Specimen: Urine, Random  Result Value Ref Range Status   Specimen Description   Final    URINE, RANDOM Performed at [facility], [address], [place], [place] [address]    Special Requests   Final    NONE Performed at [facility], [address] [place], [place] [address]    Culture (A)  Final    40,000 COLONIES/mL GRAM NEGATIVE RODS IDENTIFICATION AND SUSCEPTIBILITIES TO FOLLOW Performed at [facility], [place] [address], [place],  [address]    Report Status PENDING  Incomplete  Respiratory Panel by RT PCR (Flu A&B, Covid) -  Nasopharyngeal Swab     Status: Abnormal   Collection Time: 09/11/19  3:36 PM   Specimen: Nasopharyngeal Swab  Result Value Ref Range Status   SARS Coronavirus 2 by RT PCR POSITIVE (A) NEGATIVE Final    Comment: RESULT CALLED TO, READ BACK BY AND VERIFIED WITH: CHRISSY BRAND @1757  09/11/19 MJU (NOTE) SARS-CoV-2 target nucleic acids are DETECTED. SARS-CoV-2  RNA is generally detectable in upper respiratory specimens  during the acute phase of infection. Positive results are indicative of the presence of the identified virus, but do not rule out bacterial infection or co-infection with other pathogens not detected by the test. Clinical correlation with patient history and other diagnostic information is necessary to determine patient infection status. The expected result is Negative. Fact Sheet for Patients:  https://www.moore.com/ Fact Sheet for Healthcare Providers: https://www.young.biz/ This test is not yet approved or cleared by the [place] FDA and  has been authorized for detection and/or diagnosis of SARS-CoV-2 by FDA under an Emergency Use Authorization (EUA).  This EUA will remain in effect (meaning this test can be used) for t he duration of  the COVID-19 declaration under Section 564(b)(1) of the Act, 21 U.S.C. section 360bbb-3(b)(1), unless the authorization is terminated or revoked sooner.    Influenza A by PCR NEGATIVE NEGATIVE Final   Influenza B by PCR NEGATIVE NEGATIVE Final    Comment: (NOTE) The Xpert Xpress SARS-CoV-2/FLU/RSV assay is intended as an aid in  the diagnosis of influenza from Nasopharyngeal swab specimens and  should not be used as a sole basis for treatment. Nasal washings and  aspirates are unacceptable for Xpert Xpress SARS-CoV-2/FLU/RSV  testing. Fact Sheet for Patients: https://www.moore.com/ Fact Sheet for Healthcare Providers: https://www.young.biz/ This test  is not yet approved or cleared by the [place] FDA and  has been authorized for detection and/or diagnosis of SARS-CoV-2 by  FDA under an Emergency Use Authorization (EUA). This EUA will remain  in effect (meaning this test can be used) for the duration of the  Covid-19 declaration under Section 564(b)(1) of the Act, 21  U.S.C. section 360bbb-3(b)(1), unless the authorization is  terminated or revoked. Performed at [facility], [address] [place], [place] [address]     RADIOLOGY:  CT CHEST W CONTRAST  Result Date: 09/11/2019 CLINICAL DATA:  Pneumonia. Atypical chest pain and epigastric pain. EXAM: CT CHEST, ABDOMEN, AND PELVIS WITH CONTRAST TECHNIQUE: Multidetector CT imaging of the chest, abdomen and pelvis was performed following the standard protocol during bolus administration of intravenous contrast. CONTRAST:  77mL OMNIPAQUE IOHEXOL 300 MG/ML  SOLN COMPARISON:  MRI abdomen 09/01/2019 and CT scan 06/18/2019 FINDINGS: CT CHEST FINDINGS Cardiovascular: The heart is upper limits of normal in size. No pericardial effusion. The aorta is normal in caliber. No dissection. No significant atherosclerotic calcifications. Branch vessels are patent. No definite coronary artery calcifications. The pulmonary arteries appear normal. Mediastinum/Nodes: No mediastinal or hilar mass or adenopathy. The esophagus is grossly normal. Lungs/Pleura: There are patchy vague ground-glass opacities in both lungs, right greater than left. Findings could be due to atypical/viral pneumonia such as COVID pneumonia. Recommend clinical correlation. There is also streaky bibasilar subsegmental atelectasis and a very small left pleural effusion. No worrisome pulmonary lesions. Musculoskeletal: No breast masses, supraclavicular or axillary adenopathy. The thyroid gland appears normal. The bony structures are intact CT ABDOMEN PELVIS FINDINGS Hepatobiliary: Tiny low-attenuation lesion at the hepatic dome is  likely a benign cyst. No worrisome hepatic lesions or intrahepatic biliary dilatation. The liver contour is slightly irregular and the hepatic parenchyma appears slightly heterogeneous. The hepatic fissures are slightly prominent and findings suspicious for mild cirrhosis. The gallbladder is normal. Pancreas: No mass, inflammation or ductal dilatation. Spleen: Upper limits of normal in size. No splenic lesions. Adrenals/Urinary Tract: The adrenal glands are unremarkable. 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 There is a high-grade obstruction of the  left kidney secondary to a 14 x 10 mm calculus in the mid left ureter located at the L4 vertebral body level. Severe hydroureteronephrosis and decreased perfusion of the left kidney. No distal ureteral calculi. No bladder calculi. The bladder is grossly normal. Its largely decompressed. Stomach/Bowel: The stomach, duodenum, small bowel and colon are grossly normal. No acute inflammatory changes, mass lesions or obstructive findings. The terminal ileum is normal. The appendix is normal. Fairly significant descending and sigmoid colon diverticulosis but no findings for acute diverticulitis. Vascular/Lymphatic: Scattered aortic calcifications but no aneurysm or dissection. The branch vessels are patent. The major venous structures are patent. Small scattered mesenteric and retroperitoneal lymph nodes but no mass or adenopathy. Reproductive: Enlarged fibroid uterus. Both ovaries appear normal. Other: No pelvic mass or adenopathy. No free pelvic fluid collections. No inguinal mass or adenopathy. No abdominal wall hernia or subcutaneous lesions. Musculoskeletal: No significant bony findings. Moderate degenerative changes involving the lumbar spine. IMPRESSION: 1. High-grade obstruction of the left kidney secondary to a 14 x 10 mm calculus in the mid left ureter at the L4 level. 2. 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 3. Patchy ground-glass opacities in both lungs could be due to atypical/viral pneumonia such as COVID pneumonia. There is also streaky bibasilar atelectasis and small left effusion. 4. No mediastinal or hilar mass or adenopathy. 5. Suspect mild cirrhosis. 6. Enlarged fibroid uterus. Aortic Atherosclerosis (ICD10-I70.0). Electronically Signed   By: Rudie MeyerP.  Gallerani M.D.   On: 09/11/2019 14:20   CT ABDOMEN PELVIS W CONTRAST  Result Date: 09/11/2019 CLINICAL DATA:  Pneumonia. Atypical chest pain and epigastric pain. EXAM: CT CHEST, ABDOMEN, AND PELVIS WITH CONTRAST TECHNIQUE: Multidetector CT imaging of the chest, abdomen and pelvis was performed following the standard protocol during bolus administration of intravenous contrast. CONTRAST:  85mL OMNIPAQUE IOHEXOL 300 MG/ML  SOLN COMPARISON:  MRI abdomen 09/01/2019 and CT scan 06/18/2019 FINDINGS: CT CHEST FINDINGS Cardiovascular: The heart is upper limits of normal in size. No pericardial effusion. The aorta is normal in caliber. No dissection. No significant atherosclerotic calcifications. Branch vessels are patent. No definite coronary artery calcifications. The pulmonary arteries appear normal. Mediastinum/Nodes: No mediastinal or hilar mass or adenopathy. The esophagus is grossly normal. Lungs/Pleura: There are patchy vague ground-glass opacities in both lungs, right greater than left. Findings could be due to atypical/viral pneumonia such as COVID pneumonia. Recommend clinical correlation. There is also streaky bibasilar subsegmental atelectasis and a very small left pleural effusion. No worrisome pulmonary lesions. Musculoskeletal: No breast masses, supraclavicular or axillary adenopathy. The thyroid gland appears normal. The bony structures are intact CT ABDOMEN PELVIS FINDINGS Hepatobiliary: Tiny low-attenuation lesion at the hepatic dome is likely a benign cyst. No worrisome hepatic lesions or intrahepatic biliary dilatation. The liver  contour is slightly irregular and the hepatic parenchyma appears slightly heterogeneous. The hepatic fissures are slightly prominent and findings suspicious for mild cirrhosis. The gallbladder is normal. Pancreas: No mass, inflammation or ductal dilatation. Spleen: Upper limits of normal in size. No splenic lesions. Adrenals/Urinary Tract: The adrenal glands are unremarkable. 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 There is a high-grade obstruction of the left kidney secondary to a 14 x 10 mm calculus in the mid left ureter located at the L4 vertebral body level. Severe hydroureteronephrosis and decreased perfusion of the left kidney. No distal ureteral calculi. No bladder calculi. The bladder is grossly normal. Its largely decompressed. Stomach/Bowel: The stomach, duodenum, small bowel and colon are grossly normal. No acute  inflammatory changes, mass lesions or obstructive findings. The terminal ileum is normal. The appendix is normal. Fairly significant descending and sigmoid colon diverticulosis but no findings for acute diverticulitis. Vascular/Lymphatic: Scattered aortic calcifications but no aneurysm or dissection. The branch vessels are patent. The major venous structures are patent. Small scattered mesenteric and retroperitoneal lymph nodes but no mass or adenopathy. Reproductive: Enlarged fibroid uterus. Both ovaries appear normal. Other: No pelvic mass or adenopathy. No free pelvic fluid collections. No inguinal mass or adenopathy. No abdominal wall hernia or subcutaneous lesions. Musculoskeletal: No significant bony findings. Moderate degenerative changes involving the lumbar spine. IMPRESSION: 1. High-grade obstruction of the left kidney secondary to a 14 x 10 mm calculus in the mid left ureter at the L4 level. 2. 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 3. Patchy ground-glass opacities in both lungs could be due to atypical/viral  pneumonia such as COVID pneumonia. There is also streaky bibasilar atelectasis and small left effusion. 4. No mediastinal or hilar mass or adenopathy. 5. Suspect mild cirrhosis. 6. Enlarged fibroid uterus. Aortic Atherosclerosis (ICD10-I70.0). Electronically Signed   By: Rudie Meyer M.D.   On: 09/11/2019 14:20   DG OR UROLOGY CYSTO IMAGE ([facility] ONLY)  Result Date: 09/11/2019 There is no interpretation for this exam.  This order is for images obtained during a surgical procedure.  Please See "Surgeries" Tab for more information regarding the procedure.     CODE STATUS:     Code Status Orders  (From admission, onward)         Start     Ordered   09/11/19 1755  Full code  Continuous     09/11/19 1755        Code Status History    Date Active Date Inactive Code Status Order ID Comments User Context   02/22/2016 1627 02/22/2016 1957 Full Code [id number]  Poggi, Excell Seltzer, MD Inpatient   Advance Care Planning Activity    Advance Directive Documentation     Most Recent Value  Type of Advance Directive  Healthcare Power of Attorney, Living will  Pre-existing out of facility DNR order (yellow form or pink MOST form)  --  "MOST" Form in Place?  --       TOTAL TIME TAKING CARE OF THIS PATIENT: *40* minutes.    Enedina Finner M.D  [facility]    CC: Primary care physician; Lynnea Ferrier, MD

## 2019-09-14 ENCOUNTER — Ambulatory Visit (HOSPITAL_COMMUNITY)
Admission: RE | Admit: 2019-09-14 | Discharge: 2019-09-14 | Disposition: A | Discharge status: Home / Self Care | Payer: PRIVATE HEALTH INSURANCE | Source: Ambulatory Visit | Attending: Pulmonary Disease | Admitting: Pulmonary Disease

## 2019-09-14 DIAGNOSIS — U071 COVID-19: Secondary | ICD-10-CM | POA: Insufficient documentation

## 2019-09-14 DIAGNOSIS — J1282 Pneumonia due to coronavirus disease 2019: Secondary | ICD-10-CM | POA: Diagnosis not present

## 2019-09-14 LAB — URINE CULTURE: Culture: 40000 — AB

## 2019-09-14 MED ORDER — FAMOTIDINE IN NACL 20-0.9 MG/50ML-% IV SOLN: 20.0000 mg | Freq: Once | INTRAVENOUS | Status: DC | PRN

## 2019-09-14 MED ORDER — METHYLPREDNISOLONE SODIUM SUCC 125 MG IJ SOLR: 125.0000 mg | Freq: Once | INTRAMUSCULAR | Status: DC | PRN

## 2019-09-14 MED ORDER — EPINEPHRINE 0.3 MG/0.3ML IJ SOAJ: 0.3000 mg | Freq: Once | INTRAMUSCULAR | Status: DC | PRN

## 2019-09-14 MED ORDER — ALBUTEROL SULFATE HFA 108 (90 BASE) MCG/ACT IN AERS: 2.0000 | INHALATION_SPRAY | Freq: Once | RESPIRATORY_TRACT | Status: DC | PRN

## 2019-09-14 MED ORDER — DIPHENHYDRAMINE HCL 50 MG/ML IJ SOLN: 50.0000 mg | Freq: Once | INTRAMUSCULAR | Status: DC | PRN

## 2019-09-14 MED ORDER — SODIUM CHLORIDE 0.9 % IV SOLN
100.0000 mg | Freq: Once | INTRAVENOUS | Status: AC
  Administered 2019-09-14: 100 mg via INTRAVENOUS
  Filled 2019-09-14: qty 20

## 2019-09-14 MED ORDER — SODIUM CHLORIDE 0.9 % IV SOLN: INTRAVENOUS | Status: DC | PRN

## 2019-09-14 NOTE — Progress Notes (Signed)
  Diagnosis: COVID-19  Physician: Dr. Delford Field  Procedure: Covid Infusion Clinic Med: remdesivir infusion.  Complications: No immediate complications noted.  Discharge: Discharged home   Courtney Cochran 09/14/2019

## 2019-09-15 ENCOUNTER — Ambulatory Visit (HOSPITAL_COMMUNITY)
Admit: 2019-09-15 | Discharge: 2019-09-15 | Disposition: A | Discharge status: Home / Self Care | Payer: PRIVATE HEALTH INSURANCE | Attending: Pulmonary Disease | Admitting: Pulmonary Disease

## 2019-09-15 DIAGNOSIS — U071 COVID-19: Secondary | ICD-10-CM | POA: Diagnosis not present

## 2019-09-15 LAB — BLOOD CULTURE ID PANEL (REFLEXED)

## 2019-09-15 MED ORDER — FAMOTIDINE IN NACL 20-0.9 MG/50ML-% IV SOLN: 20.0000 mg | Freq: Once | INTRAVENOUS | Status: DC | PRN

## 2019-09-15 MED ORDER — DIPHENHYDRAMINE HCL 50 MG/ML IJ SOLN: 50.0000 mg | Freq: Once | INTRAMUSCULAR | Status: DC | PRN

## 2019-09-15 MED ORDER — SODIUM CHLORIDE 0.9 % IV SOLN: INTRAVENOUS | Status: DC | PRN

## 2019-09-15 MED ORDER — SODIUM CHLORIDE 0.9 % IV SOLN
100.0000 mg | Freq: Once | INTRAVENOUS | Status: AC
  Administered 2019-09-15: 100 mg via INTRAVENOUS
  Filled 2019-09-15: qty 20

## 2019-09-15 MED ORDER — EPINEPHRINE 0.3 MG/0.3ML IJ SOAJ: 0.3000 mg | Freq: Once | INTRAMUSCULAR | Status: DC | PRN

## 2019-09-15 MED ORDER — METHYLPREDNISOLONE SODIUM SUCC 125 MG IJ SOLR: 125.0000 mg | Freq: Once | INTRAMUSCULAR | Status: DC | PRN

## 2019-09-15 MED ORDER — ALBUTEROL SULFATE HFA 108 (90 BASE) MCG/ACT IN AERS: 2.0000 | INHALATION_SPRAY | Freq: Once | RESPIRATORY_TRACT | Status: DC | PRN

## 2019-09-15 NOTE — Anesthesia Postprocedure Evaluation (Signed)
Anesthesia Post Note  Patient: Courtney Cochran  Procedure(s) Performed: CYSTOSCOPY WITH STENT PLACEMENT (Left Ureter)  Patient location during evaluation: PACU Anesthesia Type: General Level of consciousness: awake and alert Pain management: pain level controlled Vital Signs Assessment: post-procedure vital signs reviewed and stable Respiratory status: spontaneous breathing, nonlabored ventilation, respiratory function stable and patient connected to nasal cannula oxygen Cardiovascular status: blood pressure returned to baseline and stable Postop Assessment: no apparent nausea or vomiting Anesthetic complications: no     Last Vitals:  Vitals:   09/12/19 0900 09/12/19 2242  BP:  122/84  Pulse:  88  Resp:  17  Temp: 36.7 C 36.7 C  SpO2:  95%    Last Pain:  Vitals:   09/13/19 0900  TempSrc:   PainSc: 0-No pain                 Yevette Edwards

## 2019-09-15 NOTE — Progress Notes (Signed)
  Diagnosis: COVID-19  Physician: Dr. Wright  Procedure: Covid Infusion Clinic Med: remdesivir infusion.  Complications: No immediate complications noted.  Discharge: Discharged home   Courtney Cochran 09/15/2019  

## 2019-09-16 LAB — CULTURE, BLOOD (ROUTINE X 2): Culture: NO GROWTH

## 2019-09-17 ENCOUNTER — Encounter: Payer: Self-pay | Admitting: Urology

## 2019-09-17 NOTE — Patient Instructions (Signed)
INSTRUCTIONS FOR SURGERY     Your surgery is scheduled for:   Friday, MARCH 5TH     To find out your arrival time for the day of surgery,          please call [phone] between 1 pm and 3 pm on :  Thursday, MARCH 4TH     When you arrive for surgery, report to the SECOND FLOOR OF THE MEDICAL MALL.       Do NOT stop on the first floor to register.    REMEMBER: Instructions that are not followed completely may result in serious medical risk,  up to and including death, or upon the discretion of your surgeon and anesthesiologist,            your surgery may need to be rescheduled.  __X__ 1. Do not eat food after midnight the night before your procedure.                    No gum, candy, lozenger, tic tacs, tums or hard candies.                  ABSOLUTELY NOTHING SOLID IN YOUR MOUTH AFTER MIDNIGHT                    You may drink unlimited clear liquids up to 2 hours before you are scheduled to arrive for surgery.                   Do not drink anything within those 2 hours unless you need to take medicine, then take the                   smallest amount you need.  Clear liquids include:  water, apple juice without pulp,                   any flavor Gatorade, Black coffee, black tea.  Sugar may be added but no dairy/ honey /lemon.                        Broth and jello is not considered a clear liquid.  __x__  2. On the morning of surgery, please brush your teeth with toothpaste and water. You may rinse with                  mouthwash if you wish but DO NOT SWALLOW TOOTHPASTE OR MOUTHWASH  __X___3. NO alcohol for 24 hours before or after surgery.  __x___ 4.  Do NOT smoke or use e-cigarettes for 24 HOURS PRIOR TO SURGERY.                      DO NOT Use any chewable tobacco products for at least 6 hours prior to surgery.  __x___ 5. If you start any new medication after this appointment and prior to surgery, please                    Bring it with you on the day of surgery.  ___x__ 6. Notify your doctor if there is any change in  your medical condition, such as fever,                   infection, vomitting, diarrhea or any open sores.  __x___ 7.  USE ANTIBACTERIAL SOAP as instructed, either the night before surgery or the day of surgery.                   Once you have washed with this soap, do NOT use any of the following: Powders, perfumes                    or lotions. Please do not wear make up, hairpins, clips or nail polish. You may wear deodorant.                   Men may shave their face and neck.  Women need to shave 48 hours prior to surgery.                   DO NOT wear ANY jewelry on the day of surgery. If there are rings that are too tight to                    remove easily, please address this prior to the surgery day. Piercings need to be removed.                                                                     NO METAL ON YOUR BODY.                    Do NOT bring any valuables.  If you came to Pre-Admit testing then you will not need license,                     insurance card or credit card.  If you will be staying overnight, please either leave your things in                     the car or have your family be responsible for these items.                     [facility] IS NOT RESPONSIBLE FOR BELONGINGS OR VALUABLES.  ___X__ 8. DO NOT wear contact lenses on surgery day.  You may not have dentures,                     Hearing aides, contacts or glasses in the operating room. These items can be                    Placed in the Recovery Room to receive immediately after surgery.  __x___ 9. IF YOU ARE SCHEDULED TO GO HOME ON THE SAME DAY, YOU MUST                   Have someone to drive you home and to stay with you  for the first 24 hours.                    Have an arrangement prior to arriving on surgery day.  ___x__ 10. Take the following medications on  the morning of surgery with a sip of  water:                              1.  FLOMAX                     2.                     3.                              _____ 11.  Follow any instructions provided to you by your surgeon.                        Such as enema, clear liquid bowel prep  __X__  12. STOP ASPIRIN AS OF:  Friday, February 26TH                       THIS INCLUDES BC POWDERS / GOODIES POWDER  __x___ 13. STOP Anti-inflammatories as of: Friday, February 26TH                      This includes IBUPROFEN / MOTRIN / ADVIL / ALEVE/ NAPROXYN                    YOU MAY TAKE TYLENOL ANY TIME PRIOR TO SURGERY.  __X___ 14.  Stop supplements until after surgery.                     This includes: CRANBERRY // FISH OIL // CALTRATE //MAGNESIUM GLUCONATE                     You may continue taking Vitamin B12 / Vitamin D3 AND MULTIVITS                           but do not take on the morning of surgery.  ______18. If staying overnight, please have appropriate shoes to wear to be able to walk around the unit.                   Wear clean and comfortable clothing to the hospital.  If you remember, please bring a copy of your MEDICAL POA AND LIVING WILL.  We will make a copy   Of it for your chart

## 2019-09-18 ENCOUNTER — Other Ambulatory Visit: Payer: Self-pay | Admitting: Urology

## 2019-09-18 DIAGNOSIS — N201 Calculus of ureter: Secondary | ICD-10-CM

## 2019-09-18 LAB — CULTURE, BLOOD (ROUTINE X 2)

## 2019-09-22 NOTE — Pre-Procedure Instructions (Signed)
Call to patient to go over preop instructions. Acknowledged understanding.

## 2019-09-23 ENCOUNTER — Other Ambulatory Visit: Payer: Self-pay

## 2019-09-25 ENCOUNTER — Other Ambulatory Visit: Payer: PRIVATE HEALTH INSURANCE

## 2019-09-25 ENCOUNTER — Other Ambulatory Visit: Payer: Self-pay

## 2019-09-25 DIAGNOSIS — N201 Calculus of ureter: Secondary | ICD-10-CM

## 2019-09-25 LAB — URINALYSIS, COMPLETE
Bilirubin, UA: NEGATIVE
Glucose, UA: NEGATIVE
Ketones, UA: NEGATIVE
Nitrite, UA: NEGATIVE
Specific Gravity, UA: 1.025 (ref 1.005–1.030)
Urobilinogen, Ur: 0.2 mg/dL (ref 0.2–1.0)
pH, UA: 7 (ref 5.0–7.5)

## 2019-09-25 LAB — MICROSCOPIC EXAMINATION

## 2019-09-26 LAB — COMPREHENSIVE METABOLIC PANEL
ALT: 82 IU/L — ABNORMAL HIGH (ref 0–32)
AST: 41 IU/L — ABNORMAL HIGH (ref 0–40)
Albumin/Globulin Ratio: 1.8 (ref 1.2–2.2)
Albumin: 3.7 g/dL — ABNORMAL LOW (ref 3.8–4.8)
Alkaline Phosphatase: 119 IU/L — ABNORMAL HIGH (ref 39–117)
BUN/Creatinine Ratio: 29 — ABNORMAL HIGH (ref 12–28)
BUN: 25 mg/dL (ref 8–27)
Bilirubin Total: 0.4 mg/dL (ref 0.0–1.2)
CO2: 25 mmol/L (ref 20–29)
Calcium: 9.2 mg/dL (ref 8.7–10.3)
Chloride: 105 mmol/L (ref 96–106)
Creatinine, Ser: 0.85 mg/dL (ref 0.57–1.00)
GFR calc Af Amer: 86 mL/min/{1.73_m2} (ref >59)
GFR calc non Af Amer: 74 mL/min/{1.73_m2} (ref >59)
Globulin, Total: 2.1 g/dL (ref 1.5–4.5)
Glucose: 91 mg/dL (ref 65–99)
Potassium: 4.1 mmol/L (ref 3.5–5.2)
Sodium: 145 mmol/L — ABNORMAL HIGH (ref 134–144)
Total Protein: 5.8 g/dL — ABNORMAL LOW (ref 6.0–8.5)

## 2019-09-26 LAB — CBC WITH DIFFERENTIAL
Basophils Absolute: 0 10*3/uL (ref 0.0–0.2)
Basos: 1 %
EOS (ABSOLUTE): 0.2 10*3/uL (ref 0.0–0.4)
Eos: 2 %
Hematocrit: 36.2 % (ref 34.0–46.6)
Hemoglobin: 11.9 g/dL (ref 11.1–15.9)
Immature Grans (Abs): 0.1 10*3/uL (ref 0.0–0.1)
Immature Granulocytes: 1 %
Lymphocytes Absolute: 1.5 10*3/uL (ref 0.7–3.1)
Lymphs: 20 %
MCH: 29.7 pg (ref 26.6–33.0)
MCHC: 32.9 g/dL (ref 31.5–35.7)
MCV: 90 fL (ref 79–97)
Monocytes Absolute: 0.7 10*3/uL (ref 0.1–0.9)
Monocytes: 10 %
Neutrophils Absolute: 5 10*3/uL (ref 1.4–7.0)
Neutrophils: 66 %
RBC: 4.01 x10E6/uL (ref 3.77–5.28)
RDW: 14.2 % (ref 11.7–15.4)
WBC: 7.5 10*3/uL (ref 3.4–10.8)

## 2019-09-29 ENCOUNTER — Other Ambulatory Visit: Payer: PRIVATE HEALTH INSURANCE

## 2019-09-29 LAB — CULTURE, URINE COMPREHENSIVE

## 2019-10-02 ENCOUNTER — Ambulatory Visit
Admission: RE | Admit: 2019-10-02 | Discharge: 2019-10-02 | Disposition: A | Discharge status: Home / Self Care | Payer: PRIVATE HEALTH INSURANCE | Attending: Urology | Admitting: Urology

## 2019-10-02 ENCOUNTER — Other Ambulatory Visit: Payer: Self-pay

## 2019-10-02 ENCOUNTER — Ambulatory Visit: Payer: PRIVATE HEALTH INSURANCE

## 2019-10-02 ENCOUNTER — Ambulatory Visit: Payer: PRIVATE HEALTH INSURANCE | Admitting: Certified Registered Nurse Anesthetist

## 2019-10-02 ENCOUNTER — Encounter: Payer: Self-pay | Admitting: Urology

## 2019-10-02 ENCOUNTER — Telehealth: Payer: Self-pay | Admitting: Urology

## 2019-10-02 ENCOUNTER — Encounter
Admission: RE | Disposition: A | Discharge status: Home / Self Care | Payer: Self-pay | Source: Home / Self Care | Attending: Urology

## 2019-10-02 DIAGNOSIS — Z8616 Personal history of COVID-19: Secondary | ICD-10-CM | POA: Diagnosis not present

## 2019-10-02 DIAGNOSIS — Z87891 Personal history of nicotine dependence: Secondary | ICD-10-CM | POA: Insufficient documentation

## 2019-10-02 DIAGNOSIS — N201 Calculus of ureter: Secondary | ICD-10-CM

## 2019-10-02 DIAGNOSIS — N202 Calculus of kidney with calculus of ureter: Secondary | ICD-10-CM | POA: Insufficient documentation

## 2019-10-02 DIAGNOSIS — Z87442 Personal history of urinary calculi: Secondary | ICD-10-CM | POA: Insufficient documentation

## 2019-10-02 DIAGNOSIS — N200 Calculus of kidney: Secondary | ICD-10-CM

## 2019-10-02 HISTORY — PX: CYSTOSCOPY/URETEROSCOPY/HOLMIUM LASER/STENT PLACEMENT: SHX6546

## 2019-10-02 HISTORY — DX: Herpesviral infection, unspecified: B00.9

## 2019-10-02 HISTORY — PX: CYSTOSCOPY W/ RETROGRADES: SHX1426

## 2019-10-02 HISTORY — DX: Other nonspecific abnormal finding of lung field: R91.8

## 2019-10-02 SURGERY — CYSTOSCOPY/URETEROSCOPY/HOLMIUM LASER/STENT PLACEMENT
Anesthesia: General | Site: Ureter | Laterality: Left

## 2019-10-02 MED ORDER — CEFAZOLIN SODIUM-DEXTROSE 2-4 GM/100ML-% IV SOLN
2.0000 g | INTRAVENOUS | Status: AC
  Administered 2019-10-02: 2 g via INTRAVENOUS

## 2019-10-02 MED ORDER — ONDANSETRON HCL 4 MG/2ML IJ SOLN: 4.0000 mg | Freq: Once | INTRAMUSCULAR | Status: DC | PRN

## 2019-10-02 MED ORDER — PROPOFOL 10 MG/ML IV BOLUS
INTRAVENOUS | Status: DC | PRN
  Administered 2019-10-02: 150 mg via INTRAVENOUS

## 2019-10-02 MED ORDER — MIDAZOLAM HCL 2 MG/2ML IJ SOLN
INTRAMUSCULAR | Status: DC | PRN
  Administered 2019-10-02: 2 mg via INTRAVENOUS

## 2019-10-02 MED ORDER — SUGAMMADEX SODIUM 200 MG/2ML IV SOLN
INTRAVENOUS | Status: DC | PRN
  Administered 2019-10-02: 139.8 mg via INTRAVENOUS

## 2019-10-02 MED ORDER — LIDOCAINE HCL (CARDIAC) PF 100 MG/5ML IV SOSY
PREFILLED_SYRINGE | INTRAVENOUS | Status: DC | PRN
  Administered 2019-10-02: 100 mg via INTRAVENOUS

## 2019-10-02 MED ORDER — EPHEDRINE SULFATE 50 MG/ML IJ SOLN
INTRAMUSCULAR | Status: DC | PRN
  Administered 2019-10-02: 5 mg via INTRAVENOUS

## 2019-10-02 MED ORDER — FAMOTIDINE 20 MG PO TABS: 20.0000 mg | ORAL_TABLET | Freq: Once | ORAL | Status: AC

## 2019-10-02 MED ORDER — FENTANYL CITRATE (PF) 100 MCG/2ML IJ SOLN
INTRAMUSCULAR | Status: AC
  Filled 2019-10-02: qty 2

## 2019-10-02 MED ORDER — ONDANSETRON HCL 4 MG/2ML IJ SOLN
INTRAMUSCULAR | Status: DC | PRN
  Administered 2019-10-02: 4 mg via INTRAVENOUS

## 2019-10-02 MED ORDER — FENTANYL CITRATE (PF) 100 MCG/2ML IJ SOLN: 25.0000 ug | INTRAMUSCULAR | Status: DC | PRN

## 2019-10-02 MED ORDER — MIDAZOLAM HCL 2 MG/2ML IJ SOLN
INTRAMUSCULAR | Status: AC
  Filled 2019-10-02: qty 2

## 2019-10-02 MED ORDER — TAMSULOSIN HCL 0.4 MG PO CAPS: 0.4000 mg | ORAL_CAPSULE | Freq: Every day | ORAL | 1 refills | Status: DC

## 2019-10-02 MED ORDER — ACETAMINOPHEN 10 MG/ML IV SOLN
INTRAVENOUS | Status: DC | PRN
  Administered 2019-10-02: 1000 mg via INTRAVENOUS

## 2019-10-02 MED ORDER — PHENYLEPHRINE HCL (PRESSORS) 10 MG/ML IV SOLN
INTRAVENOUS | Status: DC | PRN
  Administered 2019-10-02 (×6): 100 ug via INTRAVENOUS

## 2019-10-02 MED ORDER — PROPOFOL 10 MG/ML IV BOLUS
INTRAVENOUS | Status: AC
  Filled 2019-10-02: qty 20

## 2019-10-02 MED ORDER — SEVOFLURANE IN SOLN
RESPIRATORY_TRACT | Status: AC
  Filled 2019-10-02: qty 250

## 2019-10-02 MED ORDER — FENTANYL CITRATE (PF) 100 MCG/2ML IJ SOLN
INTRAMUSCULAR | Status: DC | PRN
  Administered 2019-10-02: 50 ug via INTRAVENOUS

## 2019-10-02 MED ORDER — LACTATED RINGERS IV SOLN: INTRAVENOUS | Status: DC

## 2019-10-02 MED ORDER — SULFAMETHOXAZOLE-TRIMETHOPRIM 800-160 MG PO TABS: 1.0000 | ORAL_TABLET | Freq: Every day | ORAL | 0 refills | Status: DC

## 2019-10-02 MED ORDER — BELLADONNA ALKALOIDS-OPIUM 16.2-60 MG RE SUPP
RECTAL | Status: AC
  Filled 2019-10-02: qty 1

## 2019-10-02 MED ORDER — BELLADONNA ALKALOIDS-OPIUM 16.2-60 MG RE SUPP
RECTAL | Status: DC | PRN
  Administered 2019-10-02: 1 via RECTAL

## 2019-10-02 MED ORDER — CEFAZOLIN SODIUM-DEXTROSE 2-4 GM/100ML-% IV SOLN
INTRAVENOUS | Status: AC
  Filled 2019-10-02: qty 100

## 2019-10-02 MED ORDER — IOHEXOL 180 MG/ML  SOLN
INTRAMUSCULAR | Status: DC | PRN
  Administered 2019-10-02: 20 mL

## 2019-10-02 MED ORDER — DEXAMETHASONE SODIUM PHOSPHATE 10 MG/ML IJ SOLN
INTRAMUSCULAR | Status: DC | PRN
  Administered 2019-10-02: 10 mg via INTRAVENOUS

## 2019-10-02 MED ORDER — HYDROCODONE-ACETAMINOPHEN 5-325 MG PO TABS: 1.0000 | ORAL_TABLET | ORAL | 0 refills | Status: AC | PRN

## 2019-10-02 MED ORDER — ROCURONIUM BROMIDE 100 MG/10ML IV SOLN
INTRAVENOUS | Status: DC | PRN
  Administered 2019-10-02: 40 mg via INTRAVENOUS

## 2019-10-02 MED ORDER — KETOROLAC TROMETHAMINE 60 MG/2ML IM SOLN
INTRAMUSCULAR | Status: DC | PRN
  Administered 2019-10-02: 15 mg via INTRAMUSCULAR

## 2019-10-02 MED ORDER — FAMOTIDINE 20 MG PO TABS
ORAL_TABLET | ORAL | Status: AC
  Administered 2019-10-02: 20 mg via ORAL
  Filled 2019-10-02: qty 1

## 2019-10-02 SURGICAL SUPPLY — 29 items
BAG DRAIN CYSTO-URO LG1000N (MISCELLANEOUS) ×2 IMPLANT
BRUSH SCRUB EZ 1% IODOPHOR (MISCELLANEOUS) ×2 IMPLANT
CATH URETL 5X70 OPEN END (CATHETERS) IMPLANT
CNTNR SPEC 2.5X3XGRAD LEK (MISCELLANEOUS)
CONT SPEC 4OZ STER OR WHT (MISCELLANEOUS)
CONTAINER SPEC 2.5X3XGRAD LEK (MISCELLANEOUS) IMPLANT
DRAPE UTILITY 15X26 TOWEL STRL (DRAPES) ×2 IMPLANT
FIBER LASER TRAC TIP (UROLOGICAL SUPPLIES) ×2 IMPLANT
GLOVE BIOGEL PI IND STRL 7.5 (GLOVE) ×1 IMPLANT
GLOVE BIOGEL PI INDICATOR 7.5 (GLOVE) ×1
GOWN STRL REUS W/ TWL LRG LVL3 (GOWN DISPOSABLE) ×1 IMPLANT
GOWN STRL REUS W/ TWL XL LVL3 (GOWN DISPOSABLE) ×1 IMPLANT
GOWN STRL REUS W/TWL LRG LVL3 (GOWN DISPOSABLE) ×1
GOWN STRL REUS W/TWL XL LVL3 (GOWN DISPOSABLE) ×1
GUIDEWIRE STR DUAL SENSOR (WIRE) ×4 IMPLANT
INFUSOR MANOMETER BAG 3000ML (MISCELLANEOUS) ×2 IMPLANT
INTRODUCER DILATOR DOUBLE (INTRODUCER) IMPLANT
KIT TURNOVER CYSTO (KITS) ×2 IMPLANT
PACK CYSTO AR (MISCELLANEOUS) ×2 IMPLANT
SET CYSTO W/LG BORE CLAMP LF (SET/KITS/TRAYS/PACK) ×2 IMPLANT
SHEATH URETERAL 12FRX35CM (MISCELLANEOUS) ×2 IMPLANT
SOL .9 NS 3000ML IRR  AL (IV SOLUTION) ×1
SOL .9 NS 3000ML IRR UROMATIC (IV SOLUTION) ×1 IMPLANT
STENT URET 6FRX24 CONTOUR (STENTS) IMPLANT
STENT URET 6FRX26 CONTOUR (STENTS) ×2 IMPLANT
SURGILUBE 2OZ TUBE FLIPTOP (MISCELLANEOUS) ×2 IMPLANT
SYR 10ML LL (SYRINGE) ×2 IMPLANT
VALVE UROSEAL ADJ ENDO (VALVE) ×2 IMPLANT
WATER STERILE IRR 1000ML POUR (IV SOLUTION) ×2 IMPLANT

## 2019-10-02 NOTE — Anesthesia Procedure Notes (Signed)
Procedure Name: Intubation Date/Time: 10/02/2019 7:29 AM Performed by: Willette Alma, CRNA Pre-anesthesia Checklist: Patient identified, Patient being monitored, Timeout performed, Emergency Drugs available and Suction available Patient Re-evaluated:Patient Re-evaluated prior to induction Oxygen Delivery Method: Circle system utilized Preoxygenation: Pre-oxygenation with 100% oxygen Induction Type: IV induction Ventilation: Mask ventilation without difficulty Laryngoscope Size: Mac and 3 Grade View: Grade I Tube type: Oral Tube size: 7.0 mm Number of attempts: 1 Airway Equipment and Method: Stylet Placement Confirmation: ETT inserted through vocal cords under direct vision,  positive ETCO2 and breath sounds checked- equal and bilateral Secured at: 21 cm Tube secured with: Tape Dental Injury: Teeth and Oropharynx as per pre-operative assessment

## 2019-10-02 NOTE — Telephone Encounter (Signed)
appt made for 3/17th

## 2019-10-02 NOTE — Telephone Encounter (Signed)
-----   Message from Sondra Come, MD sent at 10/02/2019  8:53 AM EST ----- Regarding: stent removal Please schedule stent removal in clinic in 10 to 14 days, okay to Darin Engels, MD 10/02/2019

## 2019-10-02 NOTE — Op Note (Signed)
Date of procedure: 10/02/19  Preoperative diagnosis:  1. Left mid ureteral stone 2. Multiple left renal stones  Postoperative diagnosis:  1. Same   Procedure: 1. Cystoscopy, left retrograde pyelogram with intraoperative interpretation 2. Left ureteroscopy and laser lithotripsy of mid ureteral stone 3. Left ureteroscopy and laser lithotripsy of multiple large renal stones 4. Left ureteral stent placement  Surgeon: Nickolas Madrid, MD  Anesthesia: General  Complications: None  Intraoperative findings:  1.  Uncomplicated dusting of left mid ureteral stone and multiple left renal stones 2.  Uncomplicated left ureteral stent placement  EBL: None  Specimens: Stone for analysis  Drains: Left 6 French by 26 cm ureteral stent  Indication: Courtney Cochran is a 64 y.o. patient with prior admission for COVID-19 infection and infected left mid ureteral stone who underwent emergent stent placement at that time.  She presents for definitive management of her left sided stone burden.  After reviewing the management options for treatment, they elected to proceed with the above surgical procedure(s). We have discussed the potential benefits and risks of the procedure, side effects of the proposed treatment, the likelihood of the patient achieving the goals of the procedure, and any potential problems that might occur during the procedure or recuperation. Informed consent has been obtained.  Description of procedure:  The patient was taken to the operating room and general anesthesia was induced. SCDs were placed for DVT prophylaxis. The patient was placed in the dorsal lithotomy position, prepped and draped in the usual sterile fashion, and preoperative antibiotics(Ancef) were administered. A preoperative time-out was performed.   A 21 French rigid cystoscope was used to intubate the urethra and thorough cystoscopy was performed.  There were no abnormal bladder findings.  A sensor wire was  advanced alongside the left ureteral stent and advanced easily up into the kidney.  The mid ureteral and renal stones could clearly be seen on fluoroscopy.  The stent was grasped and pulled to the meatus, and a second safety wire was added through the stent, and the old stent removed.  A 12/14 French by 35 cm ureteral access sheath was advanced gently over the wire under fluoroscopic vision up to the level of the mid ureteral stone.  The single channel digital flexible ureteroscope was then advanced through the sheath and a large 1 cm yellow stone was identified and impacted in the mid ureter.  The 200 m laser fiber on settings of 0.5 J and 20 Hz was used to fragment the stone to dust.  There was no ureteral injury.  Under fluoroscopic vision, the access sheath was advanced over the wire up into the kidney.  Thorough pyeloscopy revealed a large 1 cm stone in the lower pole, an additional 1 cm stone within a small mid calyx with a narrowed infundibulum, and multiple submucosal stones.  The laser was used to fragment all renal stones to <1 mm fragments.  Thorough pyeloscopy revealed no residual stone burden in the kidney, and fluoroscopy showed no obvious calcifications.  A retrograde pyelogram was performed from the proximal ureter and showed no filling defects or extravasation.  Careful pullback ureteroscopy demonstrated no ureteral injury or residual fragments.  A 6 French by 26 cm ureteral stent was uneventfully placed over the wire under fluoroscopic vision with an excellent curl in the upper pole, as well as in the bladder.  The bladder was drained and this concluded our procedure.  Stone fragments were sent for analysis.  Disposition: Stable to PACU  Plan: Follow-up for  stent removal in clinic in 10 to 14 days Follow-up stone analysis and consider 24-hour urine metabolic work-up Bactrim prophylaxis while stent in place  Legrand Rams, MD

## 2019-10-02 NOTE — Anesthesia Postprocedure Evaluation (Signed)
Anesthesia Post Note  Patient: Courtney Cochran  Procedure(s) Performed: CYSTOSCOPY/URETEROSCOPY/HOLMIUM LASER/STENT EXCHANGE (Left Ureter) CYSTOSCOPY WITH RETROGRADE PYELOGRAM (Left Ureter)  Patient location during evaluation: PACU Anesthesia Type: General Level of consciousness: awake and alert Pain management: pain level controlled Vital Signs Assessment: post-procedure vital signs reviewed and stable Respiratory status: spontaneous breathing and respiratory function stable Cardiovascular status: stable Anesthetic complications: no     Last Vitals:  Vitals:   10/02/19 0918 10/02/19 0938  BP:  131/71  Pulse: 64 66  Resp: 13 16  Temp: 36.7 C (!) 36.4 C  SpO2: 99% 100%    Last Pain:  Vitals:   10/02/19 0938  TempSrc: Temporal  PainSc: 0-No pain                 Shali Vesey K

## 2019-10-02 NOTE — OR Nursing (Signed)
Upon transferring patient to PACU, patient had a bowel movement and expelled the B&O suppository. Relief nurse in PACU notified and MD also aware.

## 2019-10-02 NOTE — Transfer of Care (Signed)
Immediate Anesthesia Transfer of Care Note  Patient: Trula Slade  Procedure(s) Performed: CYSTOSCOPY/URETEROSCOPY/HOLMIUM LASER/STENT EXCHANGE (Left Ureter) CYSTOSCOPY WITH RETROGRADE PYELOGRAM (Left Ureter)  Patient Location: PACU  Anesthesia Type:General  Level of Consciousness: awake, alert  and oriented  Airway & Oxygen Therapy: Patient Spontanous Breathing and Patient connected to face mask oxygen  Post-op Assessment: Report given to RN and Post -op Vital signs reviewed and stable  Post vital signs: Reviewed and stable  Last Vitals:  Vitals Value Taken Time  BP 117/62 10/02/19 0845  Temp 36.3 C 10/02/19 0845  Pulse 74 10/02/19 0845  Resp 16 10/02/19 0845  SpO2 100 % 10/02/19 0845  Vitals shown include unvalidated device data.  Last Pain:  Vitals:   10/02/19 0617  TempSrc: Temporal  PainSc: 0-No pain         Complications: No apparent anesthesia complications

## 2019-10-02 NOTE — Discharge Instructions (Addendum)

## 2019-10-02 NOTE — H&P (Signed)
UROLOGY H&P UPDATE  Agree with prior H&P dated 09/11/2019.  64 year old female who presented 09/11/2019 with Covid pneumonia as well as an infected left mid ureteral stone.  She also has extensive left renal stone burden.  She presents today for definitive management with ureteroscopy, laser lithotripsy, and stent placement.  Cardiac: RRR Lungs: CTA bilaterally  Laterality: Left Procedure: Ureteroscopy, laser lithotripsy, stent placement  Urine: Culture 2/26 no growth  We specifically discussed the risks ureteroscopy including bleeding, infection/sepsis, stent related symptoms including flank pain/urgency/frequency/incontinence/dysuria, ureteral injury, inability to access stone, or need for staged or additional procedures.  Sondra Come, MD 10/02/2019

## 2019-10-02 NOTE — Anesthesia Preprocedure Evaluation (Signed)
Anesthesia Evaluation  Patient identified by MRN, date of birth, ID band Patient awake    Reviewed: Allergy & Precautions, NPO status , Patient's Chart, lab work & pertinent test results  History of Anesthesia Complications (+) Family history of anesthesia reactionNegative for: history of anesthetic complications (sister with awareness under anesthesia)  Airway Mallampati: II       Dental   Pulmonary neg sleep apnea, pneumonia (COVID pneumonia 3 weeks ago, aysmptomatic at that time), neg COPD, Patient abstained from smoking.Not current smoker, former smoker,           Cardiovascular (-) hypertension(-) Past MI and (-) CHF (-) dysrhythmias (-) Valvular Problems/Murmurs     Neuro/Psych neg Seizures    GI/Hepatic Neg liver ROS, GERD  Medicated and Controlled,  Endo/Other  neg diabetes  Renal/GU Renal InsufficiencyRenal disease     Musculoskeletal   Abdominal   Peds  Hematology  (+) anemia ,   Anesthesia Other Findings   Reproductive/Obstetrics                             Anesthesia Physical Anesthesia Plan  ASA: II  Anesthesia Plan: General   Post-op Pain Management:    Induction: Intravenous  PONV Risk Score and Plan: 3 and Ondansetron and Midazolam  Airway Management Planned: Oral ETT  Additional Equipment:   Intra-op Plan:   Post-operative Plan:   Informed Consent: I have reviewed the patients History and Physical, chart, labs and discussed the procedure including the risks, benefits and alternatives for the proposed anesthesia with the patient or authorized representative who has indicated his/her understanding and acceptance.       Plan Discussed with:   Anesthesia Plan Comments:         Anesthesia Quick Evaluation

## 2019-10-10 LAB — CALCULI, WITH PHOTOGRAPH (CLINICAL LAB)
Calcium Oxalate Monohydrate: 30 %
Hydroxyapatite: 70 %
Weight Calculi: 9 mg

## 2019-10-14 ENCOUNTER — Other Ambulatory Visit: Payer: Self-pay

## 2019-10-14 ENCOUNTER — Ambulatory Visit (INDEPENDENT_AMBULATORY_CARE_PROVIDER_SITE_OTHER): Payer: PRIVATE HEALTH INSURANCE | Admitting: Urology

## 2019-10-14 ENCOUNTER — Encounter: Payer: Self-pay | Admitting: Urology

## 2019-10-14 VITALS — BP 129/87 | HR 84 | Ht 68.0 in | Wt 156.0 lb

## 2019-10-14 DIAGNOSIS — N201 Calculus of ureter: Secondary | ICD-10-CM

## 2019-10-14 MED ORDER — FLUCONAZOLE 150 MG PO TABS: 150.0000 mg | ORAL_TABLET | Freq: Once | ORAL | 0 refills | Status: AC

## 2019-10-14 NOTE — Progress Notes (Signed)
Cystoscopy Procedure Note:  Indication: Stent removal s/p right URS/LL/stent for ~2cm right stone burden  After informed consent and discussion of the procedure and its risks, Courtney Cochran was positioned and prepped in the standard fashion. Cystoscopy was performed with a flexible cystoscope. The stent was grasped with flexible graspers and removed in its entirety. The patient tolerated the procedure well.  Findings: Uncomplicated stent removal  Assessment and Plan: We discussed general stone prevention strategies including adequate hydration with goal of producing 2.5 L of urine daily, increasing citric acid intake, increasing calcium intake during high oxalate meals, minimizing animal protein, and decreasing salt intake. Information about dietary recommendations given today.   Fluconazole x 5 days for asymptomatic yeast in urine CaP and CaOx stone type Follow up in 6 weeks with renal ultrasound to evaluate for silent hydronephrosis 24 hour urine evaluation RTC 2 mo to review above  Sondra Come, MD 10/14/2019

## 2019-10-14 NOTE — Patient Instructions (Addendum)
Kidney Stones Kidney stones are rock-like masses that form inside of the kidneys. Kidneys are organs that make pee (urine). A kidney stone may move into other parts of the urinary tract, including:  The tubes that connect the kidneys to the bladder (ureters).  The bladder.  The tube that carries urine out of the body (urethra). Kidney stones can cause very bad pain and can block the flow of pee. The stone usually leaves your body (passes) through your pee. You may need to have a doctor take out the stone. What are the causes? Kidney stones may be caused by:  A condition in which certain glands make too much parathyroid hormone (primary hyperparathyroidism).  A buildup of a type of crystals in the bladder made of a chemical called uric acid. The body makes uric acid when you eat certain foods.  Narrowing (stricture) of one or both of the ureters.  A kidney blockage that you were born with.  Past surgery on the kidney or the ureters, such as gastric bypass surgery. What increases the risk? You are more likely to develop this condition if:  You have had a kidney stone in the past.  You have a family history of kidney stones.  You do not drink enough water.  You eat a diet that is high in protein, salt (sodium), or sugar.  You are overweight or very overweight (obese). What are the signs or symptoms? Symptoms of a kidney stone may include:  Pain in the side of the belly, right below the ribs (flank pain). Pain usually spreads (radiates) to the groin.  Needing to pee often or right away (urgently).  Pain when going pee (urinating).  Blood in your pee (hematuria).  Feeling like you may vomit (nauseous).  Vomiting.  Fever and chills. How is this treated? Treatment depends on the size, location, and makeup of the kidney stones. The stones will often pass out of the body through peeing. You may need to:  Drink more fluid to help pass the stone. In some cases, you may be  given fluids through an IV tube put into one of your veins at the hospital.  Take medicine for pain.  Make changes in your diet to help keep kidney stones from coming back. Sometimes, medical procedures are needed to remove a kidney stone. This may involve:  A procedure to break up kidney stones using a beam of light (laser) or shock waves.  Surgery to remove the kidney stones. Follow these instructions at home: Medicines  Take over-the-counter and prescription medicines only as told by your doctor.  Ask your doctor if the medicine prescribed to you requires you to avoid driving or using heavy machinery. Eating and drinking  Drink enough fluid to keep your pee pale yellow. You may be told to drink at least 8-10 glasses of water each day. This will help you pass the stone.  If told by your doctor, change your diet. This may include: ? Limiting how much salt you eat. ? Eating more fruits and vegetables. ? Limiting how much meat, poultry, fish, and eggs you eat.  Follow instructions from your doctor about eating or drinking restrictions. General instructions  Collect pee samples as told by your doctor. You may need to collect a pee sample: ? 24 hours after a stone comes out. ? 8-12 weeks after a stone comes out, and every 6-12 months after that.  Strain your pee every time you pee (urinate), for as long as told. Use the  strainer that your doctor recommends.  Do not throw out the stone. Keep it so that it can be tested by your doctor.  Keep all follow-up visits as told by your doctor. This is important. You may need follow-up tests. How is this prevented? To prevent another kidney stone:  Drink enough fluid to keep your pee pale yellow. This is the best way to prevent kidney stones.  Eat healthy foods.  Avoid certain foods as told by your doctor. You may be told to eat less protein.  Stay at a healthy weight. Where to find more information  [place]  ([facility]): www.kidney.[facility]): www.urologyhealth.org Contact a doctor if:  You have pain that gets worse or does not get better with medicine. Get help right away if:  You have a fever or chills.  You get very bad pain.  You get new pain in your belly (abdomen).  You pass out (faint).  You cannot pee. Summary  Kidney stones are rock-like masses that form inside of the kidneys.  Kidney stones can cause very bad pain and can block the flow of pee.  The stones will often pass out of the body through peeing.  Drink enough fluid to keep your pee pale yellow. This information is not intended to replace advice given to you by your health care provider. Make sure you discuss any questions you have with your health care provider. Document Revised: 12/02/2018 Document Reviewed: 12/02/2018 Elsevier Patient Education  [place].  Ureteral Stent Implantation, Care After This sheet gives you information about how to care for yourself after your procedure. Your health care provider may also give you more specific instructions. If you have problems or questions, contact your health care provider. What can I expect after the procedure? After the procedure, it is common to have:  Nausea.  Mild pain when you urinate. You may feel this pain in your lower back or lower abdomen. The pain should stop within a few minutes after you urinate. This may last for up to 1 week.  A small amount of blood in your urine for several days. Follow these instructions at home: Medicines  Take over-the-counter and prescription medicines only as told by your health care provider.  If you were prescribed an antibiotic medicine, take it as told by your health care provider. Do not stop taking the antibiotic even if you start to feel better.  Do not drive for 24 hours if you were given a sedative during your procedure.  Ask your health care provider if the medicine prescribed to  you requires you to avoid driving or using heavy machinery. Activity  Rest as told by your health care provider.  Avoid sitting for a long time without moving. Get up to take short walks every 1-2 hours. This is important to improve blood flow and breathing. Ask for help if you feel weak or unsteady.  Return to your normal activities as told by your health care provider. Ask your health care provider what activities are safe for you. General instructions   Watch for any blood in your urine. Call your health care provider if the amount of blood in your urine increases.  If you have a catheter: ? Follow instructions from your health care provider about taking care of your catheter and collection bag. ? Do not take baths, swim, or use a hot tub until your health care provider approves. Ask your health care provider if you may take  showers. You may only be allowed to take sponge baths.  Drink enough fluid to keep your urine pale yellow.  Do not use any products that contain nicotine or tobacco, such as cigarettes, e-cigarettes, and chewing tobacco. These can delay healing after surgery. If you need help quitting, ask your health care provider.  Keep all follow-up visits as told by your health care provider. This is important. Contact a health care provider if:  You have pain that gets worse or does not get better with medicine, especially pain when you urinate.  You have difficulty urinating.  You feel nauseous or you vomit repeatedly during a period of more than 2 days after the procedure. Get help right away if:  Your urine is dark red or has blood clots in it.  You are leaking urine (have incontinence).  The end of the stent comes out of your urethra.  You cannot urinate.  You have sudden, sharp, or severe pain in your abdomen or lower back.  You have a fever.  You have swelling or pain in your legs.  You have difficulty breathing. Summary  After the procedure, it is  common to have mild pain when you urinate that goes away within a few minutes after you urinate. This may last for up to 1 week.  Watch for any blood in your urine. Call your health care provider if the amount of blood in your urine increases.  Take over-the-counter and prescription medicines only as told by your health care provider.  Drink enough fluid to keep your urine pale yellow. This information is not intended to replace advice given to you by your health care provider. Make sure you discuss any questions you have with your health care provider. Document Revised: 04/22/2018 Document Reviewed: 04/23/2018 Elsevier Patient Education  2020 [facility]  You will receive a box/kit in the mail that will have a urine jug and instructions in the kit.  When the box arrives you will need to call our office [phone] to schedule a LAB appointment.  You will need to do a 24hour urine and this should be done during the days that our office will be open.  For example any day from Sunday through Thursday.  If you take Vitamin C 134m or greater please stop this 5 days prior to collection.  How to collect the urine sample: On the day you start the urine sample this 1st morning urine should NOT be collected.  For the rest of the day including all night urines should be collected.  On the next morning the 1st urine should be collected and then you will be finished with the urine collections.  You will need to bring the box with you on your LAB appointment day after urine has been collected and all instructions are complete in the box.  Your blood will be drawn and the box will be collected by our Lab employee to be sent off for analysis.  When urine and blood is complete you will need to schedule a follow up appointment for lab results.

## 2019-10-15 LAB — MICROSCOPIC EXAMINATION
RBC, Urine: >30 /hpf — AB (ref 0–2)
WBC, UA: >30 /hpf — AB (ref 0–5)

## 2019-10-15 LAB — URINALYSIS, COMPLETE
Bilirubin, UA: NEGATIVE
Glucose, UA: NEGATIVE
Ketones, UA: NEGATIVE
Nitrite, UA: NEGATIVE
Specific Gravity, UA: 1.02 (ref 1.005–1.030)
Urobilinogen, Ur: 0.2 mg/dL (ref 0.2–1.0)
pH, UA: 6 (ref 5.0–7.5)

## 2019-11-19 ENCOUNTER — Other Ambulatory Visit: Payer: Self-pay | Admitting: Physical Medicine & Rehabilitation

## 2019-11-19 DIAGNOSIS — G8929 Other chronic pain: Secondary | ICD-10-CM

## 2019-11-25 ENCOUNTER — Ambulatory Visit
Admission: RE | Admit: 2019-11-25 | Discharge: 2019-11-25 | Disposition: A | Discharge status: Home / Self Care | Payer: PRIVATE HEALTH INSURANCE | Source: Ambulatory Visit | Attending: Urology | Admitting: Urology

## 2019-11-25 ENCOUNTER — Other Ambulatory Visit: Payer: Self-pay

## 2019-11-25 DIAGNOSIS — N201 Calculus of ureter: Secondary | ICD-10-CM | POA: Diagnosis not present

## 2019-11-30 ENCOUNTER — Ambulatory Visit
Admission: RE | Admit: 2019-11-30 | Discharge: 2019-11-30 | Disposition: A | Discharge status: Home / Self Care | Payer: PRIVATE HEALTH INSURANCE | Source: Ambulatory Visit | Attending: Physical Medicine & Rehabilitation | Admitting: Physical Medicine & Rehabilitation

## 2019-11-30 ENCOUNTER — Other Ambulatory Visit: Payer: Self-pay

## 2019-11-30 DIAGNOSIS — G8929 Other chronic pain: Secondary | ICD-10-CM

## 2019-11-30 DIAGNOSIS — M5442 Lumbago with sciatica, left side: Secondary | ICD-10-CM

## 2019-12-01 ENCOUNTER — Other Ambulatory Visit: Payer: Self-pay | Admitting: Urology

## 2019-12-10 ENCOUNTER — Other Ambulatory Visit: Payer: PRIVATE HEALTH INSURANCE

## 2019-12-17 ENCOUNTER — Encounter: Payer: Self-pay | Admitting: Urology

## 2019-12-17 ENCOUNTER — Other Ambulatory Visit: Payer: Self-pay

## 2019-12-17 ENCOUNTER — Ambulatory Visit (INDEPENDENT_AMBULATORY_CARE_PROVIDER_SITE_OTHER): Payer: PRIVATE HEALTH INSURANCE | Admitting: Urology

## 2019-12-17 VITALS — BP 134/83 | HR 67 | Ht 68.0 in | Wt 155.0 lb

## 2019-12-17 DIAGNOSIS — N200 Calculus of kidney: Secondary | ICD-10-CM

## 2019-12-17 DIAGNOSIS — N852 Hypertrophy of uterus: Secondary | ICD-10-CM | POA: Insufficient documentation

## 2019-12-17 NOTE — Patient Instructions (Addendum)
1. Increase fluid intake with goal urine output of 2.5L/day 2. Stop calcium supplements, but ok to continue foods with calcium  Dietary Guidelines to Help Prevent Kidney Stones Kidney stones are deposits of minerals and salts that form inside your kidneys. Your risk of developing kidney stones may be greater depending on your diet, your lifestyle, the medicines you take, and whether you have certain medical conditions. Most people can reduce their chances of developing kidney stones by following the instructions below. Depending on your overall health and the type of kidney stones you tend to develop, your dietitian may give you more specific instructions. What are tips for following this plan? Reading food labels  Choose foods with "no salt added" or "low-salt" labels. Limit your sodium intake to less than 1500 mg per day.  Choose foods with calcium for each meal and snack. Try to eat about 300 mg of calcium at each meal. Foods that contain 200-500 mg of calcium per serving include: ? 8 oz (237 ml) of milk, fortified nondairy milk, and fortified fruit juice. ? 8 oz (237 ml) of kefir, yogurt, and soy yogurt. ? 4 oz (118 ml) of tofu. ? 1 oz of cheese. ? 1 cup (300 g) of dried figs. ? 1 cup (91 g) of cooked broccoli. ? 1-3 oz can of sardines or mackerel.  Most people need 1000 to 1500 mg of calcium each day. Talk to your dietitian about how much calcium is recommended for you. Shopping  Buy plenty of fresh fruits and vegetables. Most people do not need to avoid fruits and vegetables, even if they contain nutrients that may contribute to kidney stones.  When shopping for convenience foods, choose: ? Whole pieces of fruit. ? Premade salads with dressing on the side. ? Low-fat fruit and yogurt smoothies.  Avoid buying frozen meals or prepared deli foods.  Look for foods with live cultures, such as yogurt and kefir. Cooking  Do not add salt to food when cooking. Place a salt shaker on the  table and allow each person to add his or her own salt to taste.  Use vegetable protein, such as beans, textured vegetable protein (TVP), or tofu instead of meat in pasta, casseroles, and soups. Meal planning   Eat less salt, if told by your dietitian. To do this: ? Avoid eating processed or premade food. ? Avoid eating fast food.  Eat less animal protein, including cheese, meat, poultry, or fish, if told by your dietitian. To do this: ? Limit the number of times you have meat, poultry, fish, or cheese each week. Eat a diet free of meat at least 2 days a week. ? Eat only one serving each day of meat, poultry, fish, or seafood. ? When you prepare animal protein, cut pieces into small portion sizes. For most meat and fish, one serving is about the size of one deck of cards.  Eat at least 5 servings of fresh fruits and vegetables each day. To do this: ? Keep fruits and vegetables on hand for snacks. ? Eat 1 piece of fruit or a handful of berries with breakfast. ? Have a salad and fruit at lunch. ? Have two kinds of vegetables at dinner.  Limit foods that are high in a substance called oxalate. These include: ? Spinach. ? Rhubarb. ? Beets. ? Potato chips and french fries. ? Nuts.  If you regularly take a diuretic medicine, make sure to eat at least 1-2 fruits or vegetables high in potassium each day.  These include: ? Avocado. ? Banana. ? Orange, prune, carrot, or tomato juice. ? Baked potato. ? Cabbage. ? Beans and split peas. General instructions   Drink enough fluid to keep your urine clear or pale yellow. This is the most important thing you can do.  Talk to your health care provider and dietitian about taking daily supplements. Depending on your health and the cause of your kidney stones, you may be advised: ? Not to take supplements with vitamin C. ? To take a calcium supplement. ? To take a daily probiotic supplement. ? To take other supplements such as magnesium, fish  oil, or vitamin B6.  Take all medicines and supplements as told by your health care provider.  Limit alcohol intake to no more than 1 drink a day for nonpregnant women and 2 drinks a day for men. One drink equals 12 oz of beer, 5 oz of wine, or 1 oz of hard liquor.  Lose weight if told by your health care provider. Work with your dietitian to find strategies and an eating plan that works best for you. What foods are not recommended? Limit your intake of the following foods, or as told by your dietitian. Talk to your dietitian about specific foods you should avoid based on the type of kidney stones and your overall health. Grains Breads. Bagels. Rolls. Baked goods. Salted crackers. Cereal. Pasta. Vegetables Spinach. Rhubarb. Beets. Canned vegetables. Courtney Cochran. Olives. Meats and other protein foods Nuts. Nut butters. Large portions of meat, poultry, or fish. Salted or cured meats. Deli meats. Hot dogs. Sausages. Dairy Cheese. Beverages Regular soft drinks. Regular vegetable juice. Seasonings and other foods Seasoning blends with salt. Salad dressings. Canned soups. Soy sauce. Ketchup. Barbecue sauce. Canned pasta sauce. Casseroles. Pizza. Lasagna. Frozen meals. Potato chips. [place] fries. Summary  You can reduce your risk of kidney stones by making changes to your diet.  The most important thing you can do is drink enough fluid. You should drink enough fluid to keep your urine clear or pale yellow.  Ask your health care provider or dietitian how much protein from animal sources you should eat each day, and also how much salt and calcium you should have each day. This information is not intended to replace advice given to you by your health care provider. Make sure you discuss any questions you have with your health care provider. Document Revised: 11/05/2018 Document Reviewed: 06/26/2016 Elsevier Patient Education  2020 [place] [facility]  You will receive a box/kit in the mail that will have a urine jug and instructions in the kit.  When the box arrives you will need to call our office [phone] to schedule a LAB appointment.  You will need to do a 24hour urine and this should be done during the days that our office will be open.  For example any day from Sunday through Thursday.  If you take Vitamin C 114m or greater please stop this 5 days prior to collection.  How to collect the urine sample: On the day you start the urine sample this 1st morning urine should NOT be collected.  For the rest of the day including all night urines should be collected.  On the next morning the 1st urine should be collected and then you will be finished with the urine collections.  You will need to bring the box with you on your LAB appointment day after urine has been collected and all instructions are complete in  the box.  Your blood will be drawn and the box will be collected by our Lab employee to be sent off for analysis.  When urine and blood is complete you will need to schedule a follow up appointment for lab results.

## 2019-12-17 NOTE — Progress Notes (Signed)
   12/17/2019 11:12 AM   Trula Slade 10-10-1955 [id number]  Reason for visit: Follow up recurrent nephrolithiasis  HPI: I saw Ms. Bristol back in urology clinic for follow-up of recurrent nephrolithiasis.  She is a 64 year old female who is a recurrent stone former, most recently status post left ureteroscopy, laser lithotripsy, and stent for significant left-sided stone burden in March 2021.  Stone analysis showed 70% calcium phosphate, 30% calcium oxalate.  6 weeks renal ultrasound showed no silent hydronephrosis.  She denies any complaints of hematuria or flank pain today.  Her 24-hour urine is notable for a low urine volume of 1.07 L, significant supersaturation of calcium oxalate at 14.3 as well as calcium phosphate at 3.21, elevated urine calcium of 269, normal urine citrate of 558, urine pH of 6.2, and low uric acid.  Urine sodium is normal.  She does take a calcium supplement daily.  Serum calcium has been normal repeatedly in the past.  We reviewed her renal ultrasound findings and 24-hour urine results.  She reports she has a very tough time increasing her fluid intake, but I stressed the importance of this.  I recommended stopping her calcium supplementation secondary to her significant hypercalciuria.  I also discussed considering indapamide to decrease the urine calcium, but she would like to hold stop the supplements first before starting any additional medications.  She also has concerns about bone health, and I recommended obtaining a DEXA scan with her primary care.  We also discussed basic strategies including weightbearing exercise, and inclusion of calcium with meals.  -Increase fluid intake with goal 2.5 L urine output per day -Stop calcium supplementation -RTC 6 months for KUB and repeat 24-hour urine, rediscuss indapamide at that time if persistent hypercalciuria  Sondra Come, MD  [facility] [address]  [place], [place] [address] [phone]

## 2020-01-14 ENCOUNTER — Ambulatory Visit: Payer: PRIVATE HEALTH INSURANCE | Admitting: Urology

## 2020-02-25 ENCOUNTER — Ambulatory Visit: Payer: PRIVATE HEALTH INSURANCE | Admitting: Urology

## 2020-06-02 ENCOUNTER — Other Ambulatory Visit: Payer: Self-pay | Admitting: Internal Medicine

## 2020-06-02 DIAGNOSIS — Z1231 Encounter for screening mammogram for malignant neoplasm of breast: Secondary | ICD-10-CM

## 2020-06-16 ENCOUNTER — Other Ambulatory Visit: Payer: Self-pay

## 2020-06-16 ENCOUNTER — Ambulatory Visit
Admission: RE | Admit: 2020-06-16 | Discharge: 2020-06-16 | Disposition: A | Discharge status: Home / Self Care | Payer: PRIVATE HEALTH INSURANCE | Source: Ambulatory Visit | Attending: Urology | Admitting: Urology

## 2020-06-16 ENCOUNTER — Encounter: Payer: Self-pay | Admitting: Urology

## 2020-06-16 ENCOUNTER — Ambulatory Visit (INDEPENDENT_AMBULATORY_CARE_PROVIDER_SITE_OTHER): Payer: PRIVATE HEALTH INSURANCE | Admitting: Urology

## 2020-06-16 VITALS — BP 120/80 | HR 87 | Ht 68.0 in | Wt 155.0 lb

## 2020-06-16 DIAGNOSIS — N200 Calculus of kidney: Secondary | ICD-10-CM | POA: Insufficient documentation

## 2020-06-16 NOTE — Patient Instructions (Signed)
Dietary Guidelines to Help Prevent Kidney Stones Kidney stones are deposits of minerals and salts that form inside your kidneys. Your risk of developing kidney stones may be greater depending on your diet, your lifestyle, the medicines you take, and whether you have certain medical conditions. Most people can reduce their chances of developing kidney stones by following the instructions below. Depending on your overall health and the type of kidney stones you tend to develop, your dietitian may give you more specific instructions. What are tips for following this plan? Reading food labels  Choose foods with "no salt added" or "low-salt" labels. Limit your sodium intake to less than 1500 mg per day.  Choose foods with calcium for each meal and snack. Try to eat about 300 mg of calcium at each meal. Foods that contain 200-500 mg of calcium per serving include: ? 8 oz (237 ml) of milk, fortified nondairy milk, and fortified fruit juice. ? 8 oz (237 ml) of kefir, yogurt, and soy yogurt. ? 4 oz (118 ml) of tofu. ? 1 oz of cheese. ? 1 cup (300 g) of dried figs. ? 1 cup (91 g) of cooked broccoli. ? 1-3 oz can of sardines or mackerel.  Most people need 1000 to 1500 mg of calcium each day. Talk to your dietitian about how much calcium is recommended for you. Shopping  Buy plenty of fresh fruits and vegetables. Most people do not need to avoid fruits and vegetables, even if they contain nutrients that may contribute to kidney stones.  When shopping for convenience foods, choose: ? Whole pieces of fruit. ? Premade salads with dressing on the side. ? Low-fat fruit and yogurt smoothies.  Avoid buying frozen meals or prepared deli foods.  Look for foods with live cultures, such as yogurt and kefir. Cooking  Do not add salt to food when cooking. Place a salt shaker on the table and allow each person to add his or her own salt to taste.  Use vegetable protein, such as beans, textured vegetable  protein (TVP), or tofu instead of meat in pasta, casseroles, and soups. Meal planning   Eat less salt, if told by your dietitian. To do this: ? Avoid eating processed or premade food. ? Avoid eating fast food.  Eat less animal protein, including cheese, meat, poultry, or fish, if told by your dietitian. To do this: ? Limit the number of times you have meat, poultry, fish, or cheese each week. Eat a diet free of meat at least 2 days a week. ? Eat only one serving each day of meat, poultry, fish, or seafood. ? When you prepare animal protein, cut pieces into small portion sizes. For most meat and fish, one serving is about the size of one deck of cards.  Eat at least 5 servings of fresh fruits and vegetables each day. To do this: ? Keep fruits and vegetables on hand for snacks. ? Eat 1 piece of fruit or a handful of berries with breakfast. ? Have a salad and fruit at lunch. ? Have two kinds of vegetables at dinner.  Limit foods that are high in a substance called oxalate. These include: ? Spinach. ? Rhubarb. ? Beets. ? Potato chips and french fries. ? Nuts.  If you regularly take a diuretic medicine, make sure to eat at least 1-2 fruits or vegetables high in potassium each day. These include: ? Avocado. ? Banana. ? Orange, prune, carrot, or tomato juice. ? Baked potato. ? Cabbage. ? Beans and split   peas. General instructions   Drink enough fluid to keep your urine clear or pale yellow. This is the most important thing you can do.  Talk to your health care provider and dietitian about taking daily supplements. Depending on your health and the cause of your kidney stones, you may be advised: ? Not to take supplements with vitamin C. ? To take a calcium supplement. ? To take a daily probiotic supplement. ? To take other supplements such as magnesium, fish oil, or vitamin B6.  Take all medicines and supplements as told by your health care provider.  Limit alcohol intake to no  more than 1 drink a day for nonpregnant women and 2 drinks a day for men. One drink equals 12 oz of beer, 5 oz of wine, or 1 oz of hard liquor.  Lose weight if told by your health care provider. Work with your dietitian to find strategies and an eating plan that works best for you. What foods are not recommended? Limit your intake of the following foods, or as told by your dietitian. Talk to your dietitian about specific foods you should avoid based on the type of kidney stones and your overall health. Grains Breads. Bagels. Rolls. Baked goods. Salted crackers. Cereal. Pasta. Vegetables Spinach. Rhubarb. Beets. Canned vegetables. Pickles. Olives. Meats and other protein foods Nuts. Nut butters. Large portions of meat, poultry, or fish. Salted or cured meats. Deli meats. Hot dogs. Sausages. Dairy Cheese. Beverages Regular soft drinks. Regular vegetable juice. Seasonings and other foods Seasoning blends with salt. Salad dressings. Canned soups. Soy sauce. Ketchup. Barbecue sauce. Canned pasta sauce. Casseroles. Pizza. Lasagna. Frozen meals. Potato chips. French fries. Summary  You can reduce your risk of kidney stones by making changes to your diet.  The most important thing you can do is drink enough fluid. You should drink enough fluid to keep your urine clear or pale yellow.  Ask your health care provider or dietitian how much protein from animal sources you should eat each day, and also how much salt and calcium you should have each day. This information is not intended to replace advice given to you by your health care provider. Make sure you discuss any questions you have with your health care provider. Document Revised: 11/05/2018 Document Reviewed: 06/26/2016 Elsevier Patient Education  2020 [employer]  

## 2020-06-16 NOTE — Progress Notes (Signed)
   06/16/2020 4:25 PM   Trula Slade 10/15/1955 [id number]  Reason for visit: Follow up recurrent nephrolithiasis  HPI: I saw Ms. Natarajan back in clinic for follow-up of recurrent nephrolithiasis.  She is a 64 year old female with recurrent stones, most recently status post left ureteroscopy, laser lithotripsy, and stent for significant left-sided stone burden in March 2021.  Stone analysis showed 70% calcium phosphate and 30% calcium oxalate.  A follow-up ultrasound showed no hydronephrosis.  She has not had any hematuria or flank pain since her last visit.  At our last visit, we also reviewed 24-hour urine results notable for a low urine volume of 1.07 L, significant supersaturation of calcium oxalate at 14.3 as well as calcium phosphate at 3.21, elevated urine calcium of 269, normal urine citrate of 558, urine pH of 6.2, and low uric acid.  Urine sodium is normal.  She was taking excessive calcium supplements at that time, and we discontinued those.  She wanted to hold off on other therapies like indapamide.  Serum calcium has been normal multiple times in the past.  She has not yet had her KUB performed today.  She did not complete a 24-hour urine follow-up.  She admittedly has not been as good about increased fluid intake.  We discussed general stone prevention strategies including adequate hydration with goal of producing 2.5 L of urine daily, increasing citric acid intake, increasing calcium intake during high oxalate meals, minimizing animal protein, and decreasing salt intake. Information about dietary recommendations given today.   Call with KUB results from today RTC 1 year with KUB, consider repeat 24-hour urine if persistent stones   Sondra Come, MD  [facility] [address] [place], [place] [address] [phone]

## 2020-06-20 ENCOUNTER — Telehealth: Payer: Self-pay

## 2020-06-20 NOTE — Telephone Encounter (Signed)
-----   Message from Sondra Come, MD sent at 06/20/2020  1:42 PM EST ----- KUB looks good, no significant stones, keep one year follow up as scheduled  Legrand Rams, MD 06/20/2020

## 2020-09-05 ENCOUNTER — Other Ambulatory Visit: Payer: Self-pay

## 2020-09-05 ENCOUNTER — Ambulatory Visit
Admission: RE | Admit: 2020-09-05 | Discharge: 2020-09-05 | Disposition: A | Discharge status: Home / Self Care | Payer: PRIVATE HEALTH INSURANCE | Source: Ambulatory Visit | Attending: Internal Medicine | Admitting: Internal Medicine

## 2020-09-05 DIAGNOSIS — Z1231 Encounter for screening mammogram for malignant neoplasm of breast: Secondary | ICD-10-CM | POA: Diagnosis present

## 2020-09-12 ENCOUNTER — Other Ambulatory Visit: Payer: Self-pay | Admitting: Internal Medicine

## 2020-09-12 DIAGNOSIS — N6489 Other specified disorders of breast: Secondary | ICD-10-CM

## 2020-09-12 DIAGNOSIS — R928 Other abnormal and inconclusive findings on diagnostic imaging of breast: Secondary | ICD-10-CM

## 2020-09-20 ENCOUNTER — Other Ambulatory Visit: Payer: Self-pay

## 2020-09-20 ENCOUNTER — Ambulatory Visit
Admission: RE | Admit: 2020-09-20 | Discharge: 2020-09-20 | Disposition: A | Discharge status: Home / Self Care | Payer: PRIVATE HEALTH INSURANCE | Source: Ambulatory Visit | Attending: Internal Medicine | Admitting: Internal Medicine

## 2020-09-20 DIAGNOSIS — R928 Other abnormal and inconclusive findings on diagnostic imaging of breast: Secondary | ICD-10-CM

## 2020-09-20 DIAGNOSIS — N6489 Other specified disorders of breast: Secondary | ICD-10-CM | POA: Diagnosis present

## 2020-11-21 IMAGING — CR DG ABDOMEN 1V
1 series · 2 of 2 positions shown · non-contrast
Comparison: June 22, 2019.

CLINICAL DATA: Nephrolithiasis.

EXAM:
ABDOMEN - 1 VIEW

[Series 1: dg abd 1 view · 0.14mm/px · 2 of 2 slices shown]
[im 1/2]
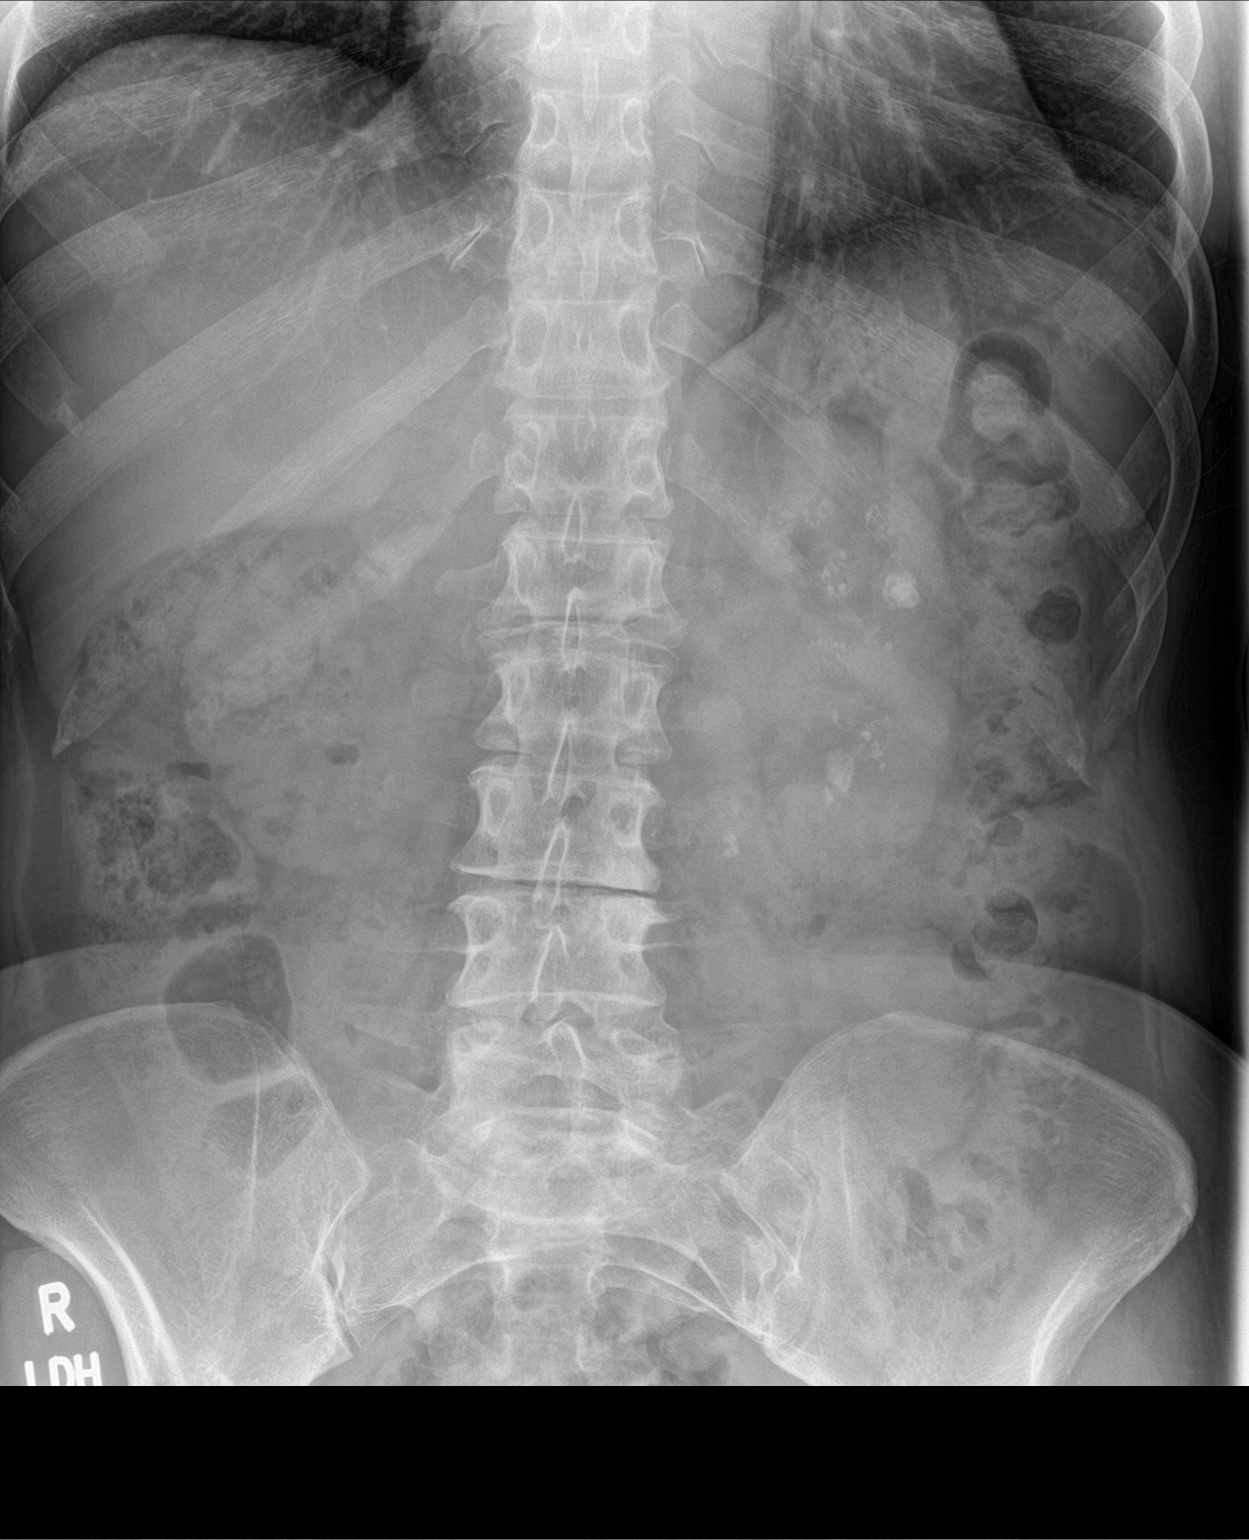
[im 2/2]
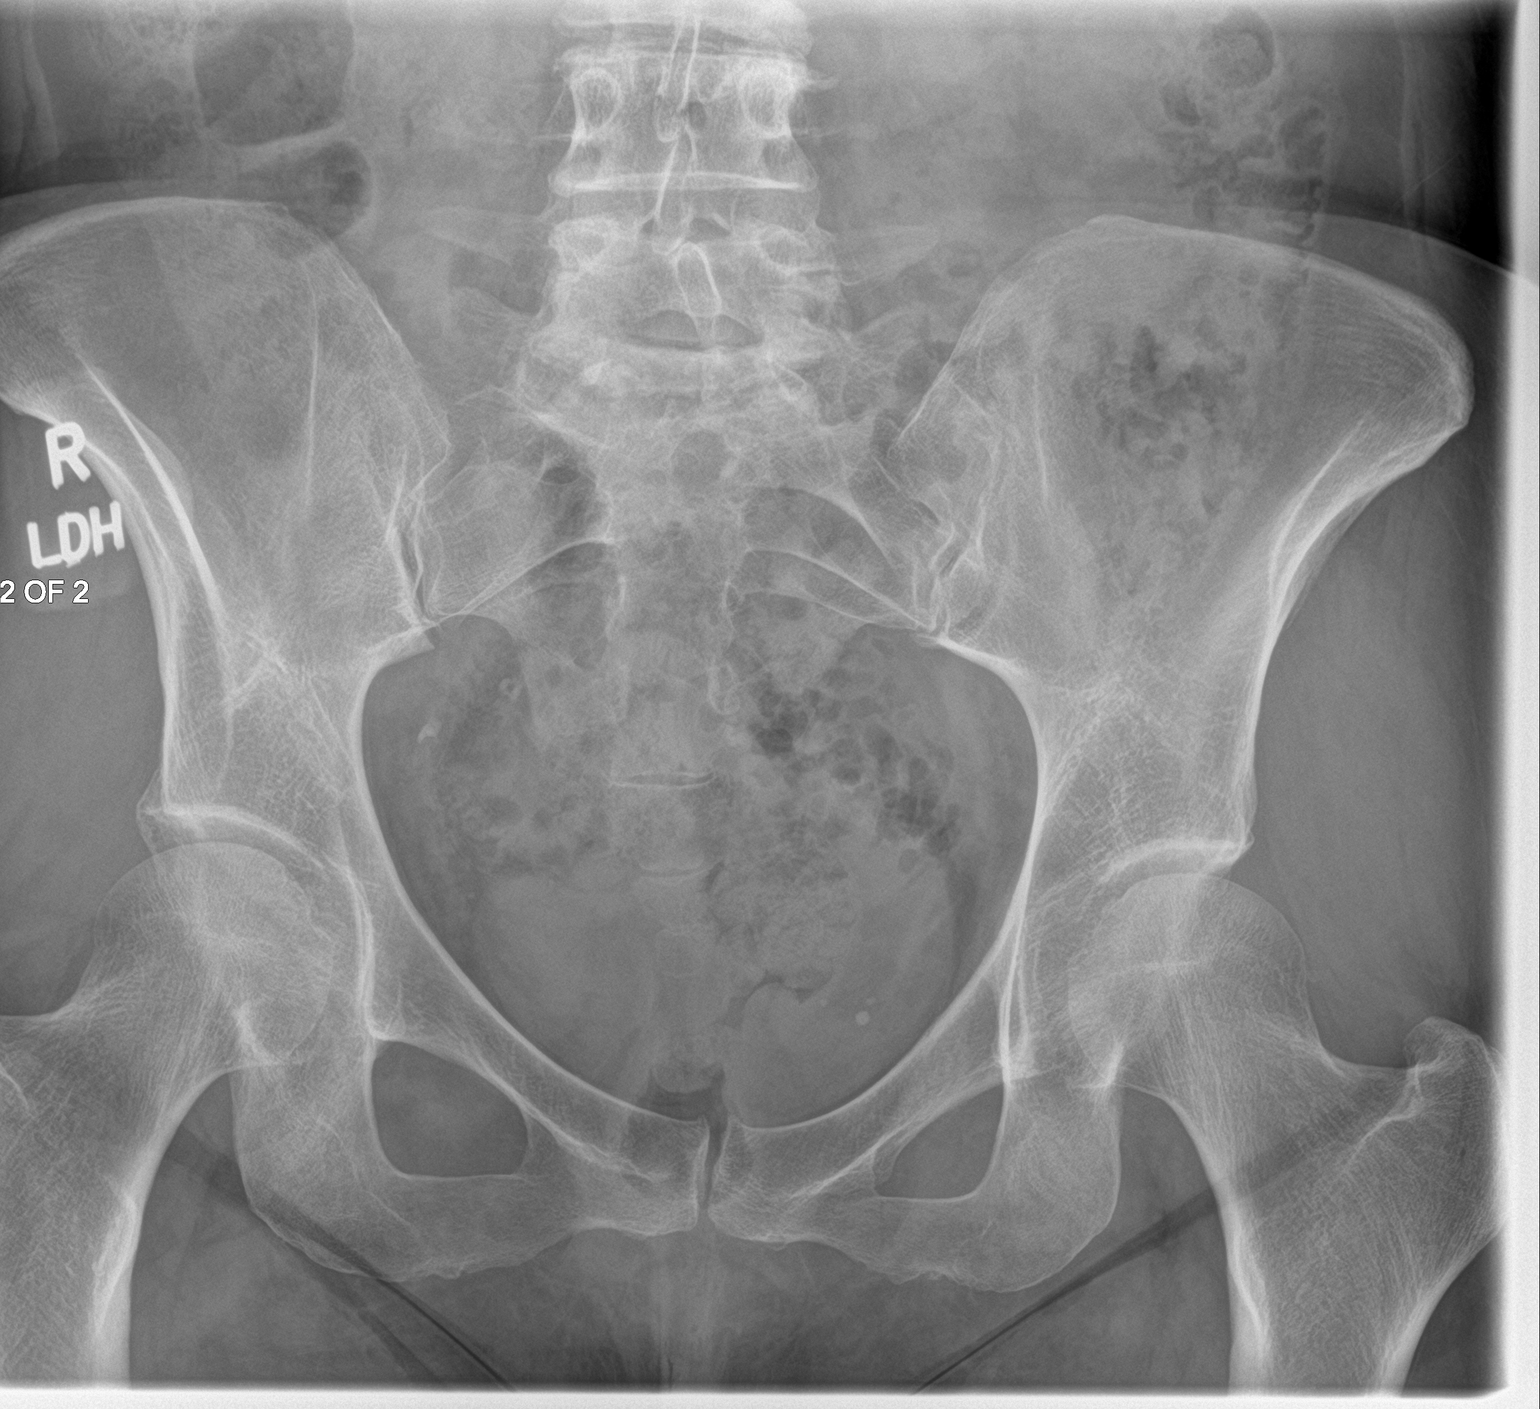

[2 of 2 positions shown; findings below may reference images not displayed]

FINDINGS: The bowel gas pattern is normal. Stable large left renal calculi are
noted. Small right renal calculi are noted. Irregular calculus is
seen to the left of the L3 vertebral body concerning for possible
proximal ureteral calculus. Distal left ureteral calculus noted on
prior exam is no longer visualized. Phleboliths are noted in the
pelvis.
IMPRESSION: Stable large left renal calculi are noted. Irregular calculus is
seen to the left of the L3 vertebral body concerning for possible
proximal ureteral calculus. CT scan of the abdomen and pelvis is
recommended for further evaluation.

## 2020-12-05 ENCOUNTER — Other Ambulatory Visit: Payer: Self-pay | Admitting: Internal Medicine

## 2020-12-05 DIAGNOSIS — M5116 Intervertebral disc disorders with radiculopathy, lumbar region: Secondary | ICD-10-CM

## 2020-12-09 ENCOUNTER — Other Ambulatory Visit: Payer: Self-pay

## 2020-12-09 ENCOUNTER — Ambulatory Visit
Admission: RE | Admit: 2020-12-09 | Discharge: 2020-12-09 | Disposition: A | Discharge status: Home / Self Care | Payer: PRIVATE HEALTH INSURANCE | Source: Ambulatory Visit | Attending: Internal Medicine | Admitting: Internal Medicine

## 2020-12-09 DIAGNOSIS — M5116 Intervertebral disc disorders with radiculopathy, lumbar region: Secondary | ICD-10-CM | POA: Insufficient documentation

## 2021-01-23 ENCOUNTER — Ambulatory Visit: Payer: PRIVATE HEALTH INSURANCE | Admitting: Family Medicine

## 2021-01-24 IMAGING — CT CT CHEST W/ CM
1 series · 1 of 1 positions shown · IV contrast (omnipaque)
Comparison: MRI abdomen 09/01/2019 and CT scan 06/18/2019

CLINICAL DATA: Pneumonia. Atypical chest pain and epigastric pain.

EXAM:
CT CHEST, ABDOMEN, AND PELVIS WITH CONTRAST
TECHNIQUE: Multidetector CT imaging of the chest, abdomen and pelvis was
performed following the standard protocol during bolus
administration of intravenous contrast.
CONTRAST:  85mL OMNIPAQUE IOHEXOL 300 MG/ML  SOLN

[Series 1: topogram 0.60 cor · coronal · 1.50mm/px · 1 of 1 slices shown]
[im 1/1]
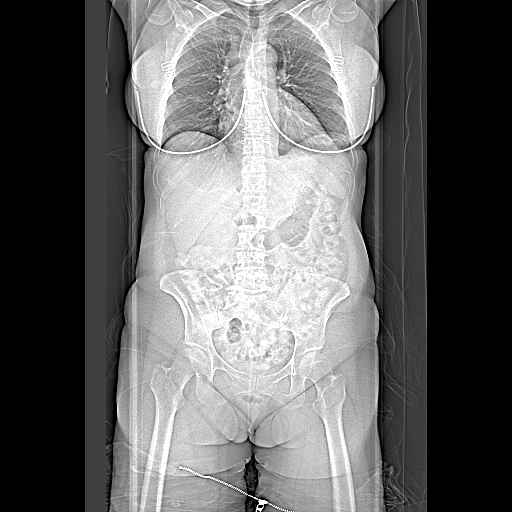

[1 of 1 positions shown; findings below may reference images not displayed]

FINDINGS: CT CHEST FINDINGS

Cardiovascular: The heart is upper limits of normal in size. No
pericardial effusion. The aorta is normal in caliber. No dissection.
No significant atherosclerotic calcifications. Branch vessels are
patent. No definite coronary artery calcifications. The pulmonary
arteries appear normal.

Mediastinum/Nodes: No mediastinal or hilar mass or adenopathy. The
esophagus is grossly normal.

Lungs/Pleura: There are patchy vague ground-glass opacities in both
lungs, right greater than left. Findings could be due to
atypical/viral pneumonia such as COVID pneumonia. Recommend clinical
correlation. There is also streaky bibasilar subsegmental
atelectasis and a very small left pleural effusion. No worrisome
pulmonary lesions.

Musculoskeletal: No breast masses, supraclavicular or axillary
adenopathy. The thyroid gland appears normal. The bony structures
are intact

CT ABDOMEN PELVIS FINDINGS

Hepatobiliary: Tiny low-attenuation lesion at the hepatic dome is
likely a benign cyst. No worrisome hepatic lesions or intrahepatic
biliary dilatation. The liver contour is slightly irregular and the
hepatic parenchyma appears slightly heterogeneous. The hepatic
fissures are slightly prominent and findings suspicious for mild
cirrhosis. The gallbladder is normal.

Pancreas: No mass, inflammation or ductal dilatation.

Spleen: Upper limits of normal in size. No splenic lesions.

Adrenals/Urinary Tract: The adrenal glands are unremarkable.

Numerous bilateral renal calculi with suspected medullary
nephrocalcinosis. There is a high-grade obstruction of the left
kidney secondary to a 14 x 10 mm calculus in the mid left ureter
located at the L4 vertebral body level. Severe hydroureteronephrosis
and decreased perfusion of the left kidney. No distal ureteral
calculi. No bladder calculi.

The bladder is grossly normal. Its largely decompressed.

Stomach/Bowel: The stomach, duodenum, small bowel and colon are
grossly normal. No acute inflammatory changes, mass lesions or
obstructive findings. The terminal ileum is normal. The appendix is
normal.

Fairly significant descending and sigmoid colon diverticulosis but
no findings for acute diverticulitis.

Vascular/Lymphatic: Scattered aortic calcifications but no aneurysm
or dissection. The branch vessels are patent. The major venous
structures are patent. Small scattered mesenteric and
retroperitoneal lymph nodes but no mass or adenopathy.

Reproductive: Enlarged fibroid uterus. Both ovaries appear normal.

Other: No pelvic mass or adenopathy. No free pelvic fluid
collections. No inguinal mass or adenopathy. No abdominal wall
hernia or subcutaneous lesions.

Musculoskeletal: No significant bony findings. Moderate degenerative
changes involving the lumbar spine.
IMPRESSION: 1. High-grade obstruction of the left kidney secondary to a 14 x 10
mm calculus in the mid left ureter at the L4 level.
2. Numerous bilateral renal calculi with suspected medullary
nephrocalcinosis.
3. Patchy ground-glass opacities in both lungs could be due to
atypical/viral pneumonia such as COVID pneumonia. There is also
streaky bibasilar atelectasis and small left effusion.
4. No mediastinal or hilar mass or adenopathy.
5. Suspect mild cirrhosis.
6. Enlarged fibroid uterus.

Aortic Atherosclerosis (MSJNP-CZN.N).

## 2021-05-08 ENCOUNTER — Other Ambulatory Visit: Payer: Self-pay | Admitting: Dermatology

## 2021-05-22 ENCOUNTER — Other Ambulatory Visit (HOSPITAL_COMMUNITY): Payer: Self-pay | Admitting: Internal Medicine

## 2021-06-14 ENCOUNTER — Ambulatory Visit
Admission: RE | Admit: 2021-06-14 | Discharge: 2021-06-14 | Disposition: A | Discharge status: Home / Self Care | Payer: PRIVATE HEALTH INSURANCE | Source: Ambulatory Visit | Attending: Internal Medicine | Admitting: Internal Medicine

## 2021-06-14 ENCOUNTER — Other Ambulatory Visit: Payer: Self-pay

## 2021-06-14 DIAGNOSIS — E7849 Other hyperlipidemia: Secondary | ICD-10-CM | POA: Insufficient documentation

## 2021-06-21 ENCOUNTER — Other Ambulatory Visit: Payer: Self-pay | Admitting: Urology

## 2021-06-21 ENCOUNTER — Ambulatory Visit
Admission: RE | Admit: 2021-06-21 | Discharge: 2021-06-21 | Disposition: A | Discharge status: Home / Self Care | Payer: PRIVATE HEALTH INSURANCE | Source: Ambulatory Visit | Attending: Urology | Admitting: Urology

## 2021-06-21 ENCOUNTER — Encounter: Payer: Self-pay | Admitting: Urology

## 2021-06-21 ENCOUNTER — Other Ambulatory Visit: Payer: Self-pay

## 2021-06-21 ENCOUNTER — Ambulatory Visit (INDEPENDENT_AMBULATORY_CARE_PROVIDER_SITE_OTHER): Payer: PRIVATE HEALTH INSURANCE | Admitting: Urology

## 2021-06-21 VITALS — BP 129/80 | HR 94 | Ht 67.01 in | Wt 151.7 lb

## 2021-06-21 DIAGNOSIS — N200 Calculus of kidney: Secondary | ICD-10-CM

## 2021-06-21 NOTE — Patient Instructions (Signed)
Dietary Guidelines to Help Prevent Kidney Stones Kidney stones are deposits of minerals and salts that form inside your kidneys. Your risk of developing kidney stones may be greater depending on your diet, your lifestyle, the medicines you take, and whether you have certain medical conditions. Most people can lower their chances of developing kidney stones by following the instructions below. Your dietitian may give you more specific instructions depending on your overall health and the type of kidney stones you tend to develop. What are tips for following this plan? Reading food labels  Choose foods with "no salt added" or "low-salt" labels. Limit your salt (sodium) intake to less than 1,500 mg a day. Choose foods with calcium for each meal and snack. Try to eat about 300 mg of calcium at each meal. Foods that contain 200-500 mg of calcium a serving include: 8 oz (237 mL) of milk, calcium-fortifiednon-dairy milk, and calcium-fortifiedfruit juice. Calcium-fortified means that calcium has been added to these drinks. 8 oz (237 mL) of kefir, yogurt, and soy yogurt. 4 oz (114 g) of tofu. 1 oz (28 g) of cheese. 1 cup (150 g) of dried figs. 1 cup (91 g) of cooked broccoli. One 3 oz (85 g) can of sardines or mackerel. Most people need 1,000-1,500 mg of calcium a day. Talk to your dietitian about how much calcium is recommended for you. Shopping Buy plenty of fresh fruits and vegetables. Most people do not need to avoid fruits and vegetables, even if these foods contain nutrients that may contribute to kidney stones. When shopping for convenience foods, choose: Whole pieces of fruit. Pre-made salads with dressing on the side. Low-fat fruit and yogurt smoothies. Avoid buying frozen meals or prepared deli foods. These can be high in sodium. Look for foods with live cultures, such as yogurt and kefir. Choose high-fiber grains, such as whole-wheat breads, oat bran, and wheat cereals. Cooking Do not add  salt to food when cooking. Place a salt shaker on the table and allow each person to add his or her own salt to taste. Use vegetable protein, such as beans, textured vegetable protein (TVP), or tofu, instead of meat in pasta, casseroles, and soups. Meal planning Eat less salt, if told by your dietitian. To do this: Avoid eating processed or pre-made food. Avoid eating fast food. Eat less animal protein, including cheese, meat, poultry, or fish, if told by your dietitian. To do this: Limit the number of times you have meat, poultry, fish, or cheese each week. Eat a diet free of meat at least 2 days a week. Eat only one serving each day of meat, poultry, fish, or seafood. When you prepare animal protein, cut pieces into small portion sizes. For most meat and fish, one serving is about the size of the palm of your hand. Eat at least five servings of fresh fruits and vegetables each day. To do this: Keep fruits and vegetables on hand for snacks. Eat one piece of fruit or a handful of berries with breakfast. Have a salad and fruit at lunch. Have two kinds of vegetables at dinner. Limit foods that are high in a substance called oxalate. These include: Spinach (cooked), rhubarb, beets, sweet potatoes, and Swiss chard. Peanuts. Potato chips, french fries, and baked potatoes with skin on. Nuts and nut products. Chocolate. If you regularly take a diuretic medicine, make sure to eat at least 1 or 2 servings of fruits or vegetables that are high in potassium each day. These include: Avocado. Banana. Orange, prune,   carrot, or tomato juice. Baked potato. Cabbage. Beans and split peas. Lifestyle  Drink enough fluid to keep your urine pale yellow. This is the most important thing you can do. Spread your fluid intake throughout the day. If you drink alcohol: Limit how much you use to: 0-1 drink a day for women who are not pregnant. 0-2 drinks a day for men. Be aware of how much alcohol is in your  drink. In the [place] one drink equals one 12 oz bottle of beer (355 mL), one 5 oz glass of wine (148 mL), or one 1 oz glass of hard liquor (44 mL). Lose weight if told by your health care provider. Work with your dietitian to find an eating plan and weight loss strategies that work best for you. General information Talk to your health care provider and dietitian about taking daily supplements. You may be told the following depending on your health and the cause of your kidney stones: Not to take supplements with vitamin C. To take a calcium supplement. To take a daily probiotic supplement. To take other supplements such as magnesium, fish oil, or vitamin B6. Take over-the-counter and prescription medicines only as told by your health care provider. These include supplements. What foods should I limit? Limit your intake of the following foods, or eat them as told by your dietitian. Vegetables Spinach. Rhubarb. Beets. Canned vegetables. Pickles. Olives. Baked potatoes with skin. Grains Wheat bran. Baked goods. Salted crackers. Cereals high in sugar. Meats and other proteins Nuts. Nut butters. Large portions of meat, poultry, or fish. Salted, precooked, or cured meats, such as sausages, meat loaves, and hot dogs. Dairy Cheese. Beverages Regular soft drinks. Regular vegetable juice. Seasonings and condiments Seasoning blends with salt. Salad dressings. Soy sauce. Ketchup. Barbecue sauce. Other foods Canned soups. Canned pasta sauce. Casseroles. Pizza. Lasagna. Frozen meals. Potato chips. French fries. The items listed above may not be a complete list of foods and beverages you should limit. Contact a dietitian for more information. What foods should I avoid? Talk to your dietitian about specific foods you should avoid based on the type of kidney stones you have and your overall health. Fruits Grapefruit. The item listed above may not be a complete list of foods and beverages you should  avoid. Contact a dietitian for more information. Summary Kidney stones are deposits of minerals and salts that form inside your kidneys. You can lower your risk of kidney stones by making changes to your diet. The most important thing you can do is drink enough fluid. Drink enough fluid to keep your urine pale yellow. Talk to your dietitian about how much calcium you should have each day, and eat less salt and animal protein as told by your dietitian. This information is not intended to replace advice given to you by your health care provider. Make sure you discuss any questions you have with your health care provider. Document Revised: 07/09/2019 Document Reviewed: 07/09/2019 Elsevier Patient Education  2022 Elsevier Inc.  

## 2021-06-21 NOTE — Progress Notes (Signed)
   06/21/2021 4:21 PM   Courtney Cochran [birth year] [id number]  Reason for visit: Follow up recurrent nephrolithiasis  HPI: 65 year old female with recurrent stones, most recently left ureteroscopy, laser lithotripsy, and stent for significant left-sided stone burden in March 2021.  Stone analysis showed 70% calcium phosphate and 30% calcium oxalate.  A follow-up ultrasound showed no hydronephrosis.   24-hour urine results notable for a low urine volume of 1.07 L, significant supersaturation of calcium oxalate at 14.3 as well as calcium phosphate at 3.21, elevated urine calcium of 269, normal urine citrate of 558, urine pH of 6.2, and low uric acid.  Urine sodium is normal.  She was taking excessive calcium supplements at that time, and we discontinued those.  She wanted to hold off on other therapies like indapamide.  Serum calcium has been normal multiple times in the past.  She denies any urologic problems over the last year, specifically no stone events or gross hematuria.  I personally viewed and interpreted her KUB today that shows slight increase in her left-sided nonobstructive renal stone burden, as well as potentially some new right-sided lower pole stones.    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 We discussed general stone prevention strategies including adequate hydration with goal of producing 2.5 L of urine daily, increasing citric acid intake, increasing calcium intake during high oxalate meals, minimizing animal protein, and decreasing salt intake. Information about dietary recommendations given today.   RTC 1 year KUB prior   Sondra Come, MD  [facility] [address] [place], [place] [address] [phone]

## 2021-10-02 ENCOUNTER — Other Ambulatory Visit: Payer: Self-pay | Admitting: Internal Medicine

## 2021-10-02 DIAGNOSIS — Z1231 Encounter for screening mammogram for malignant neoplasm of breast: Secondary | ICD-10-CM

## 2021-10-06 ENCOUNTER — Telehealth: Payer: Self-pay

## 2021-10-06 NOTE — Telephone Encounter (Signed)
Patient left a voice mail saying she will be coming in on Monday to get a paper filled out to get in to a new rehab place byTuesday if possible, needs last blood count.  ?

## 2021-11-07 ENCOUNTER — Ambulatory Visit
Admission: RE | Admit: 2021-11-07 | Discharge: 2021-11-07 | Disposition: A | Discharge status: Home / Self Care | Payer: BC Managed Care – PPO | Source: Ambulatory Visit | Attending: Internal Medicine | Admitting: Internal Medicine

## 2021-11-07 DIAGNOSIS — Z1231 Encounter for screening mammogram for malignant neoplasm of breast: Secondary | ICD-10-CM | POA: Insufficient documentation

## 2022-02-13 ENCOUNTER — Encounter: Admission: RE | Payer: Self-pay | Source: Home / Self Care

## 2022-02-13 ENCOUNTER — Ambulatory Visit: Admission: RE | Admit: 2022-02-13 | Payer: BC Managed Care – PPO | Source: Home / Self Care

## 2022-02-13 SURGERY — COLONOSCOPY WITH PROPOFOL
Anesthesia: General

## 2022-02-27 DIAGNOSIS — N201 Calculus of ureter: Secondary | ICD-10-CM

## 2022-02-27 HISTORY — DX: Calculus of ureter: N20.1

## 2022-03-28 ENCOUNTER — Inpatient Hospital Stay: Payer: BC Managed Care – PPO

## 2022-03-28 ENCOUNTER — Other Ambulatory Visit: Payer: Self-pay | Admitting: Urology

## 2022-03-28 ENCOUNTER — Encounter: Payer: Self-pay | Admitting: Emergency Medicine

## 2022-03-28 ENCOUNTER — Inpatient Hospital Stay: Payer: BC Managed Care – PPO | Admitting: General Practice

## 2022-03-28 ENCOUNTER — Encounter
Admission: EM | Disposition: A | Discharge status: Home / Self Care | Payer: Self-pay | Source: Home / Self Care | Attending: Family Medicine

## 2022-03-28 ENCOUNTER — Other Ambulatory Visit: Payer: Self-pay

## 2022-03-28 ENCOUNTER — Emergency Department: Payer: BC Managed Care – PPO

## 2022-03-28 ENCOUNTER — Observation Stay
Admission: EM | Admit: 2022-03-28 | Discharge: 2022-03-28 | Disposition: A | Discharge status: Home / Self Care | Payer: BC Managed Care – PPO | Attending: Internal Medicine | Admitting: Internal Medicine

## 2022-03-28 DIAGNOSIS — N132 Hydronephrosis with renal and ureteral calculous obstruction: Secondary | ICD-10-CM | POA: Diagnosis not present

## 2022-03-28 DIAGNOSIS — Z20822 Contact with and (suspected) exposure to covid-19: Secondary | ICD-10-CM | POA: Insufficient documentation

## 2022-03-28 DIAGNOSIS — Z79899 Other long term (current) drug therapy: Secondary | ICD-10-CM | POA: Diagnosis not present

## 2022-03-28 DIAGNOSIS — N390 Urinary tract infection, site not specified: Secondary | ICD-10-CM

## 2022-03-28 DIAGNOSIS — Z8616 Personal history of COVID-19: Secondary | ICD-10-CM | POA: Diagnosis not present

## 2022-03-28 DIAGNOSIS — R8281 Pyuria: Secondary | ICD-10-CM

## 2022-03-28 DIAGNOSIS — N138 Other obstructive and reflux uropathy: Secondary | ICD-10-CM | POA: Diagnosis not present

## 2022-03-28 DIAGNOSIS — Z87891 Personal history of nicotine dependence: Secondary | ICD-10-CM | POA: Diagnosis not present

## 2022-03-28 DIAGNOSIS — N201 Calculus of ureter: Secondary | ICD-10-CM

## 2022-03-28 DIAGNOSIS — N1330 Unspecified hydronephrosis: Secondary | ICD-10-CM

## 2022-03-28 DIAGNOSIS — R109 Unspecified abdominal pain: Secondary | ICD-10-CM | POA: Diagnosis present

## 2022-03-28 DIAGNOSIS — N200 Calculus of kidney: Principal | ICD-10-CM

## 2022-03-28 DIAGNOSIS — N139 Obstructive and reflux uropathy, unspecified: Secondary | ICD-10-CM | POA: Diagnosis not present

## 2022-03-28 DIAGNOSIS — N3000 Acute cystitis without hematuria: Secondary | ICD-10-CM | POA: Diagnosis not present

## 2022-03-28 HISTORY — PX: CYSTOSCOPY W/ URETERAL STENT PLACEMENT: SHX1429

## 2022-03-28 LAB — URINALYSIS, ROUTINE W REFLEX MICROSCOPIC
Bilirubin Urine: NEGATIVE
Glucose, UA: NEGATIVE mg/dL
Ketones, ur: NEGATIVE mg/dL
Nitrite: POSITIVE — AB
Protein, ur: 30 mg/dL — AB
Specific Gravity, Urine: 1.011 (ref 1.005–1.030)
WBC, UA: >50 WBC/hpf — ABNORMAL HIGH (ref 0–5)
pH: 6 (ref 5.0–8.0)

## 2022-03-28 LAB — CBC WITH DIFFERENTIAL/PLATELET
Abs Immature Granulocytes: 0.05 10*3/uL (ref 0.00–0.07)
Basophils Absolute: 0 10*3/uL (ref 0.0–0.1)
Basophils Relative: 0 %
Eosinophils Absolute: 0.3 10*3/uL (ref 0.0–0.5)
Eosinophils Relative: 2 %
HCT: 40.9 % (ref 36.0–46.0)
Hemoglobin: 13.4 g/dL (ref 12.0–15.0)
Immature Granulocytes: 1 %
Lymphocytes Relative: 11 %
Lymphs Abs: 1.2 10*3/uL (ref 0.7–4.0)
MCH: 30.6 pg (ref 26.0–34.0)
MCHC: 32.8 g/dL (ref 30.0–36.0)
MCV: 93.4 fL (ref 80.0–100.0)
Monocytes Absolute: 1.2 10*3/uL — ABNORMAL HIGH (ref 0.1–1.0)
Monocytes Relative: 11 %
Neutro Abs: 8 10*3/uL — ABNORMAL HIGH (ref 1.7–7.7)
Neutrophils Relative %: 75 %
Platelets: 247 10*3/uL (ref 150–400)
RBC: 4.38 MIL/uL (ref 3.87–5.11)
RDW: 12.5 % (ref 11.5–15.5)
WBC: 10.7 10*3/uL — ABNORMAL HIGH (ref 4.0–10.5)
nRBC: 0 % (ref 0.0–0.2)

## 2022-03-28 LAB — COMPREHENSIVE METABOLIC PANEL
ALT: 22 U/L (ref 0–44)
AST: 22 U/L (ref 15–41)
Albumin: 3.5 g/dL (ref 3.5–5.0)
Alkaline Phosphatase: 80 U/L (ref 38–126)
Anion gap: 7 (ref 5–15)
BUN: 25 mg/dL — ABNORMAL HIGH (ref 8–23)
CO2: 27 mmol/L (ref 22–32)
Calcium: 8.5 mg/dL — ABNORMAL LOW (ref 8.9–10.3)
Chloride: 109 mmol/L (ref 98–111)
Creatinine, Ser: 0.87 mg/dL (ref 0.44–1.00)
GFR, Estimated: >60 mL/min (ref >60)
Glucose, Bld: 95 mg/dL (ref 70–99)
Potassium: 3.3 mmol/L — ABNORMAL LOW (ref 3.5–5.1)
Sodium: 143 mmol/L (ref 135–145)
Total Bilirubin: 1.1 mg/dL (ref 0.3–1.2)
Total Protein: 6.6 g/dL (ref 6.5–8.1)

## 2022-03-28 LAB — RESP PANEL BY RT-PCR (FLU A&B, COVID) ARPGX2
Influenza A by PCR: NEGATIVE
Influenza B by PCR: NEGATIVE
SARS Coronavirus 2 by RT PCR: NEGATIVE

## 2022-03-28 LAB — TROPONIN I (HIGH SENSITIVITY): Troponin I (High Sensitivity): 7 ng/L (ref <18)

## 2022-03-28 LAB — LIPASE, BLOOD: Lipase: 30 U/L (ref 11–51)

## 2022-03-28 SURGERY — CYSTOSCOPY, FLEXIBLE, WITH STENT REPLACEMENT
Anesthesia: General | Site: Ureter | Laterality: Left

## 2022-03-28 MED ORDER — DEXAMETHASONE SODIUM PHOSPHATE 10 MG/ML IJ SOLN
INTRAMUSCULAR | Status: DC | PRN
  Administered 2022-03-28: 8 mg via INTRAVENOUS

## 2022-03-28 MED ORDER — OXYCODONE HCL 5 MG/5ML PO SOLN: 5.0000 mg | Freq: Once | ORAL | Status: DC | PRN

## 2022-03-28 MED ORDER — SUGAMMADEX SODIUM 200 MG/2ML IV SOLN
INTRAVENOUS | Status: DC | PRN
  Administered 2022-03-28: 300 mg via INTRAVENOUS

## 2022-03-28 MED ORDER — SODIUM CHLORIDE 0.9 % IR SOLN
Status: DC | PRN
  Administered 2022-03-28: 3000 mL

## 2022-03-28 MED ORDER — LIDOCAINE HCL (CARDIAC) PF 100 MG/5ML IV SOSY
PREFILLED_SYRINGE | INTRAVENOUS | Status: DC | PRN
  Administered 2022-03-28: 80 mg via INTRAVENOUS

## 2022-03-28 MED ORDER — ROCURONIUM BROMIDE 100 MG/10ML IV SOLN
INTRAVENOUS | Status: DC | PRN
  Administered 2022-03-28: 50 mg via INTRAVENOUS

## 2022-03-28 MED ORDER — FENTANYL CITRATE (PF) 100 MCG/2ML IJ SOLN: 25.0000 ug | INTRAMUSCULAR | Status: DC | PRN

## 2022-03-28 MED ORDER — IOHEXOL 300 MG/ML  SOLN
100.0000 mL | Freq: Once | INTRAMUSCULAR | Status: AC | PRN
  Administered 2022-03-28: 100 mL via INTRAVENOUS

## 2022-03-28 MED ORDER — SODIUM CHLORIDE 0.9 % IV SOLN
2.0000 g | Freq: Once | INTRAVENOUS | Status: AC
  Administered 2022-03-28: 2 g via INTRAVENOUS
  Filled 2022-03-28: qty 20

## 2022-03-28 MED ORDER — MIDAZOLAM HCL 2 MG/2ML IJ SOLN
INTRAMUSCULAR | Status: AC
  Filled 2022-03-28: qty 2

## 2022-03-28 MED ORDER — ONDANSETRON HCL 4 MG/2ML IJ SOLN
INTRAMUSCULAR | Status: DC | PRN
  Administered 2022-03-28: 4 mg via INTRAVENOUS

## 2022-03-28 MED ORDER — TAMSULOSIN HCL 0.4 MG PO CAPS: 0.4000 mg | ORAL_CAPSULE | Freq: Every day | ORAL | 0 refills | Status: DC

## 2022-03-28 MED ORDER — PROPOFOL 10 MG/ML IV BOLUS
INTRAVENOUS | Status: AC
  Filled 2022-03-28: qty 20

## 2022-03-28 MED ORDER — OXYCODONE HCL 5 MG PO TABS: 5.0000 mg | ORAL_TABLET | Freq: Once | ORAL | Status: DC | PRN

## 2022-03-28 MED ORDER — GLYCOPYRROLATE 0.2 MG/ML IJ SOLN
INTRAMUSCULAR | Status: DC | PRN
  Administered 2022-03-28: .2 mg via INTRAVENOUS

## 2022-03-28 MED ORDER — MORPHINE SULFATE (PF) 2 MG/ML IV SOLN
2.0000 mg | Freq: Once | INTRAVENOUS | Status: AC
  Administered 2022-03-28: 2 mg via INTRAVENOUS
  Filled 2022-03-28: qty 1

## 2022-03-28 MED ORDER — SULFAMETHOXAZOLE-TRIMETHOPRIM 800-160 MG PO TABS: 1.0000 | ORAL_TABLET | Freq: Two times a day (BID) | ORAL | 0 refills | Status: AC

## 2022-03-28 MED ORDER — ACETAMINOPHEN 500 MG PO TABS
1000.0000 mg | ORAL_TABLET | Freq: Once | ORAL | Status: AC
  Administered 2022-03-28: 1000 mg via ORAL
  Filled 2022-03-28: qty 2

## 2022-03-28 MED ORDER — FENTANYL CITRATE (PF) 100 MCG/2ML IJ SOLN
INTRAMUSCULAR | Status: AC
  Filled 2022-03-28: qty 2

## 2022-03-28 MED ORDER — LACTATED RINGERS IV SOLN: INTRAVENOUS | Status: DC | PRN

## 2022-03-28 MED ORDER — SODIUM CHLORIDE 0.9 % IV BOLUS
1000.0000 mL | Freq: Once | INTRAVENOUS | Status: AC
  Administered 2022-03-28: 1000 mL via INTRAVENOUS

## 2022-03-28 MED ORDER — MIDAZOLAM HCL 2 MG/2ML IJ SOLN
INTRAMUSCULAR | Status: DC | PRN
  Administered 2022-03-28: 2 mg via INTRAVENOUS

## 2022-03-28 MED ORDER — OXYBUTYNIN CHLORIDE 5 MG PO TABS
ORAL_TABLET | ORAL | 0 refills | Status: DC
Start: 2022-03-28 — End: 2022-12-13

## 2022-03-28 MED ORDER — PROPOFOL 10 MG/ML IV BOLUS
INTRAVENOUS | Status: DC | PRN
  Administered 2022-03-28: 150 mg via INTRAVENOUS

## 2022-03-28 MED ORDER — FENTANYL CITRATE (PF) 100 MCG/2ML IJ SOLN
INTRAMUSCULAR | Status: DC | PRN
  Administered 2022-03-28: 25 ug via INTRAVENOUS
  Administered 2022-03-28: 50 ug via INTRAVENOUS
  Administered 2022-03-28: 25 ug via INTRAVENOUS

## 2022-03-28 MED ORDER — LIDOCAINE 5 % EX PTCH
1.0000 | MEDICATED_PATCH | CUTANEOUS | Status: DC
  Administered 2022-03-28: 1 via TRANSDERMAL
  Filled 2022-03-28: qty 1

## 2022-03-28 SURGICAL SUPPLY — 26 items
BAG DRAIN SIEMENS DORNER NS (MISCELLANEOUS) ×2 IMPLANT
BASKET ZERO TIP 1.9FR (BASKET) IMPLANT
BRUSH SCRUB EZ 1% IODOPHOR (MISCELLANEOUS) ×2 IMPLANT
CATH URET FLEX-TIP 2 LUMEN 10F (CATHETERS) IMPLANT
CATH URETL OPEN END 6X70 (CATHETERS) ×1 IMPLANT
CNTNR SPEC 2.5X3XGRAD LEK (MISCELLANEOUS) ×2
CONT SPEC 4OZ STER OR WHT (MISCELLANEOUS) ×2
CONTAINER SPEC 2.5X3XGRAD LEK (MISCELLANEOUS) ×1 IMPLANT
DRAPE UTILITY 15X26 TOWEL STRL (DRAPES) ×2 IMPLANT
GLOVE SURG UNDER POLY LF SZ7.5 (GLOVE) ×2 IMPLANT
GOWN STRL REUS W/ TWL LRG LVL3 (GOWN DISPOSABLE) ×2 IMPLANT
GOWN STRL REUS W/ TWL XL LVL3 (GOWN DISPOSABLE) ×2 IMPLANT
GOWN STRL REUS W/TWL LRG LVL3 (GOWN DISPOSABLE) ×2
GOWN STRL REUS W/TWL XL LVL3 (GOWN DISPOSABLE) ×2
GUIDEWIRE STR DUAL SENSOR (WIRE) ×2 IMPLANT
IV NS IRRIG 3000ML ARTHROMATIC (IV SOLUTION) ×2 IMPLANT
KIT TURNOVER CYSTO (KITS) ×2 IMPLANT
PACK CYSTO AR (MISCELLANEOUS) ×2 IMPLANT
SET CYSTO W/LG BORE CLAMP LF (SET/KITS/TRAYS/PACK) ×2 IMPLANT
SHEATH NAVIGATOR HD 12/14X36 (SHEATH) IMPLANT
STENT URET 6FRX24 CONTOUR (STENTS) ×1 IMPLANT
STENT URET 6FRX26 CONTOUR (STENTS) IMPLANT
SURGILUBE 2OZ TUBE FLIPTOP (MISCELLANEOUS) ×2 IMPLANT
TRAP FLUID SMOKE EVACUATOR (MISCELLANEOUS) ×1 IMPLANT
VALVE UROSEAL ADJ ENDO (VALVE) IMPLANT
WATER STERILE IRR 500ML POUR (IV SOLUTION) ×2 IMPLANT

## 2022-03-28 NOTE — Anesthesia Procedure Notes (Signed)
Procedure Name: Intubation Date/Time: 03/28/2022 8:19 AM  Performed by: Jaye Beagle, CRNAPre-anesthesia Checklist: Patient identified, Emergency Drugs available, Suction available and Patient being monitored Patient Re-evaluated:Patient Re-evaluated prior to induction Oxygen Delivery Method: Circle system utilized Preoxygenation: Pre-oxygenation with 100% oxygen Induction Type: IV induction Ventilation: Mask ventilation without difficulty Laryngoscope Size: McGraph and 3 Grade View: Grade I Tube type: Oral Tube size: 7.0 mm Number of attempts: 1 Airway Equipment and Method: Stylet and Oral airway Placement Confirmation: ETT inserted through vocal cords under direct vision, positive ETCO2 and breath sounds checked- equal and bilateral Secured at: 22 cm Tube secured with: Tape Dental Injury: Teeth and Oropharynx as per pre-operative assessment

## 2022-03-28 NOTE — ED Provider Notes (Signed)
[facility] Provider Note    Event Date/Time   First MD Initiated Contact with Patient 03/28/22 0142     (approximate)   History   Back Pain   HPI  Courtney Cochran is a 66 y.o. female who comes in with left back and flank pain for 3 days.  She reports that the pain is currently only a 4 out of 10 but she wanted to come in and be evaluated given she had a history of prior very large kidney stone requiring intervention.  She denies any fevers.  The pain is in the left upper flank and radiates down into her lower abdomen.  She denies any chest pain, shortness of breath, falls or hitting her head.  Denies any blood in her urine may be a little bit of increased urinary frequency.     Physical Exam   Triage Vital Signs: ED Triage Vitals  Enc Vitals Group     BP 03/28/22 0115 130/82     Pulse Rate 03/28/22 0115 76     Resp 03/28/22 0115 18     Temp 03/28/22 0115 97.9 F (36.6 C)     Temp Source 03/28/22 0115 Oral     SpO2 03/28/22 0115 95 %     Weight 03/28/22 0116 153 lb (69.4 kg)     Height 03/28/22 0116 5\' 8"  (1.727 m)     Head Circumference --      Peak Flow --      Pain Score 03/28/22 0118 4     Pain Loc --      Pain Edu? --      Excl. in GC? --     Most recent vital signs: Vitals:   03/28/22 0115  BP: 130/82  Pulse: 76  Resp: 18  Temp: 97.9 F (36.6 C)  SpO2: 95%     General: Awake, no distress.  CV:  Good peripheral perfusion.  Resp:  Normal effort.  Abd:  No distention.  Left-sided flank tenderness radiating down into the abdomen Other:     ED Results / Procedures / Treatments   Labs (all labs ordered are listed, but only abnormal results are displayed) Labs Reviewed  CBC WITH DIFFERENTIAL/PLATELET - Abnormal; Notable for the following components:      Result Value   WBC 10.7 (*)    Neutro Abs 8.0 (*)    Monocytes Absolute 1.2 (*)    All other components within normal limits  URINE CULTURE  RESP PANEL BY RT-PCR  (FLU A&B, COVID) ARPGX2  COMPREHENSIVE METABOLIC PANEL  LIPASE, BLOOD  URINALYSIS, ROUTINE W REFLEX MICROSCOPIC  TROPONIN I (HIGH SENSITIVITY)     EKG  My interpretation of EKG:  Normal sinus rate of 74 without any ST elevation, T wave inversion in lead V3 and lead III, normal intervals  RADIOLOGY I have reviewed the CT personally interpreted and patient has left hydroureteronephrosis with 3 renal calculus  IMPRESSION: 1. Left hydroureteronephrosis from 3 ureteral calculi. The largest calculus is in the proximal ureter measuring 1 cm at the level of L3-4. Two adjacent distal ureteral calculi measures 6 and 4 mm. 2. Innumerable renal calculi on the left more than right. 3. Colonic diverticulosis.  Uterine fibroids.  PROCEDURES:  Critical Care performed: No  .1-3 Lead EKG Interpretation  Performed by: 03/30/22, MD Authorized by: Concha Se, MD     Interpretation: normal     ECG rate:  75   ECG rate assessment:  normal     Rhythm: sinus rhythm     Ectopy: none     Conduction: normal      MEDICATIONS ORDERED IN ED: Medications  lidocaine (LIDODERM) 5 % 1 patch (1 patch Transdermal Patch Applied 03/28/22 0228)  acetaminophen (TYLENOL) tablet 1,000 mg (1,000 mg Oral Given 03/28/22 0228)  sodium chloride 0.9 % bolus 1,000 mL (1,000 mLs Intravenous New Bag/Given 03/28/22 0228)  iohexol (OMNIPAQUE) 300 MG/ML solution 100 mL (100 mLs Intravenous Contrast Given 03/28/22 0354)     IMPRESSION / MDM / ASSESSMENT AND PLAN / ED COURSE  I reviewed the triage vital signs and the nursing notes.   Patient's presentation is most consistent with acute presentation with potential threat to life or bodily function.   Differential includes kidney stone, diverticulitis, aneurysm, obstruction, perforation.  We will do CT with contrast given she reports that this does not really feel like a prior kidney stone just to ensure there is nothing else other than a kidney stone going on.   Urine to evaluate for any UTI.  Patient does report that she is driving so we will start off with Tylenol, lidocaine patch  CBC with slightly elevated white count with left shift.  CMP shows stable creatinine.  Lipase normal.  Urine looks concerning for UTI will send for culture.  IMPRESSION: 1. Left hydroureteronephrosis from 3 ureteral calculi. The largest calculus is in the proximal ureter measuring 1 cm at the level of L3-4. Two adjacent distal ureteral calculi measures 6 and 4 mm. 2. Innumerable renal calculi on the left more than right. 3. Colonic diverticulosis.  Uterine fibroids.   Given the urine shows concerns for superimposed infection associated with large kidney stone on the left will discuss with urology Dr. Lonna Cobb.  Patient is feeling much better.  Repeat temperature is afebrile.  Dr. Lonna Cobb recommends admission for stent placement.  Discussed with patient and she is okay with this.  Will discuss with the hospital team for admission    The patient is on the cardiac monitor to evaluate for evidence of arrhythmia and/or significant heart rate changes.    FINAL CLINICAL IMPRESSION(S) / ED DIAGNOSES   Final diagnoses:  Kidney stone  Acute cystitis without hematuria     Rx / DC Orders   ED Discharge Orders     None        Note:  This document was prepared using Dragon voice recognition software and may include unintentional dictation errors.   Concha Se, MD 03/28/22 [phone]

## 2022-03-28 NOTE — Discharge Instructions (Addendum)
IMPRESSION: 1. Left hydroureteronephrosis from 3 ureteral calculi. The largest calculus is in the proximal ureter measuring 1 cm at the level of L3-4. Two adjacent distal ureteral calculi measures 6 and 4 mm. 2. Innumerable renal calculi on the left more than right. 3. Colonic diverticulosis.  Uterine fibroids      .AMBULATORY SURGERY  DISCHARGE INSTRUCTIONS   The drugs that you were given will stay in your system until tomorrow so for the next 24 hours you should not:  Drive an automobile Make any legal decisions Drink any alcoholic beverage   You may resume regular meals tomorrow.  Today it is better to start with liquids and gradually work up to solid foods.  You may eat anything you prefer, but it is better to start with liquids, then soup and crackers, and gradually work up to solid foods.   Please notify your doctor immediately if you have any unusual bleeding, trouble breathing, redness and pain at the surgery site, drainage, fever, or pain not relieved by medication.     Your post-operative visit with Dr.                                       is: Date:                        Time:    Please call to schedule your post-operative visit.  Additional Instructions:   DISCHARGE INSTRUCTIONS FOR KIDNEY STONE/URETERAL STENT   MEDICATIONS:  1. Resume all your other meds from home.  2.  AZO (over-the-counter) can help with the burning/stinging when you urinate. 3.  An antibiotic prescription was sent to your pharmacy 4.  Prescriptions for tamsulosin and oxybutynin were sent for stent/bladder discomfort if present  ACTIVITY:  1. May resume regular activities in 24 hours. 2. No driving while on narcotic pain medications  3. Drink plenty of water  4. Continue to walk at home - you can still get blood clots when you are at home, so keep active, but don't over do it.  5. May return to work/school tomorrow or when you feel ready    SIGNS/SYMPTOMS TO CALL:  Common  postoperative symptoms include urinary frequency, urgency, bladder spasm and blood in the urine  Please call us if you have a fever greater than 101.5, uncontrolled nausea/vomiting, uncontrolled pain, dizziness, unable to urinate, excessively bloody urine, chest pain, shortness of breath, leg swelling, leg pain, or any other concerns or questions.   You can reach [place] at [phone].   FOLLOW-UP:  1. You we will be contacted by our office for scheduling of stone removal with Dr. Richardo Hanks

## 2022-03-28 NOTE — Consult Note (Signed)
Urology Consult  Requesting physician: Artis Delay, MD  Reason for consultation: Obstructing left ureteral calculi with UTI  Chief Complaint: Kidney stone  History of Present Illness: Courtney Cochran is a 66 y.o. presented to the ED early this morning with a 3-day history of left flank pain which was rated 4/10.  No identifiable precipitating, aggravating or alleviating factors.  There is radiation of her pain to the left lower quadrant.  Denies fever, chills, nausea, vomiting or dysuria.  Urinalysis was nitrite positive with > 50 WBCs and no significant epithelial cells.    Prior history of stone disease and she elected to be evaluated in the ED this morning before her symptoms worsen  Underwent left ureteral stent placement by Dr. Richardo Hanks 09/11/2019 for a 14 mm left proximal ureteral calculus with UTI.  Follow-up ureteroscopy performed 10/02/2019.  Stone analysis was calcium phosphate/calcium oxalate.  She had hypercalciuria and low urine volume.  Indapamide was discussed however she declined medical management of her hypercalciuria    Past Medical History:  Diagnosis Date   Anemia    H/O 38 YEARS AGO   Chronic kidney disease    STONES   COVID-19 09/11/2019   remdesivir infusions   Family history of adverse reaction to anesthesia    PTS SISTER HAS WOKEN UP TWICE DURING SURGERY   Foot injury    LEFT-FOOT BRUISED AND SWOLLEN PER PT(RELATED TO HER FALL THAT BROKE HER WRIST)   GERD (gastroesophageal reflux disease)    RARE   Herpes simplex virus infection    recurrent   History of kidney stones 08/2019   Multiple lung nodules on CT    stable since 2017   Osteopenia    Pneumonia 08/2019   due to +Covid    Past Surgical History:  Procedure Laterality Date   COLONOSCOPY     CYSTOSCOPY W/ RETROGRADES Left 10/02/2019   Procedure: CYSTOSCOPY WITH RETROGRADE PYELOGRAM;  Surgeon: Sondra Come, MD;  Location: [facility];  Service: Urology;  Laterality: Left;   CYSTOSCOPY WITH  STENT PLACEMENT Left 09/11/2019   Procedure: CYSTOSCOPY WITH STENT PLACEMENT;  Surgeon: Sondra Come, MD;  Location: [facility];  Service: Urology;  Laterality: Left;   CYSTOSCOPY/URETEROSCOPY/HOLMIUM LASER/STENT PLACEMENT Left 10/02/2019   Procedure: CYSTOSCOPY/URETEROSCOPY/HOLMIUM LASER/STENT EXCHANGE;  Surgeon: Sondra Come, MD;  Location: [facility];  Service: Urology;  Laterality: Left;   DILATION AND CURETTAGE OF UTERUS     KIDNEY STONE SURGERY     OPEN REDUCTION INTERNAL FIXATION (ORIF) DISTAL RADIAL FRACTURE Right 02/22/2016   Procedure: OPEN REDUCTION INTERNAL FIXATION (ORIF) DISTAL RADIAL FRACTURE;  Surgeon: Christena Flake, MD;  Location: [facility];  Service: Orthopedics;  Laterality: Right;    Home Medications:  No outpatient medications have been marked as taking for the 03/28/22 encounter [facility] Encounter).    Allergies: No Known Allergies  Family History  Problem Relation Age of Onset   CAD Mother    CAD Father    Cancer Brother    Breast cancer Neg Hx     Social History:  reports that she quit smoking about 46 years ago. Her smoking use included cigarettes. She has a 4.00 pack-year smoking history. She has never used smokeless tobacco. She reports current alcohol use. She reports that she does not use drugs.  ROS: A complete review of systems was performed.  All systems are negative except for pertinent findings as noted.  Physical Exam:  Vital signs in last 24 hours: Temp:  [97.7  F (36.5 C)-97.9 F (36.6 C)] 97.7 F (36.5 C) (08/30 0626) Pulse Rate:  [73-80] 77 (08/30 0626) Resp:  [16-18] 16 (08/30 0626) BP: (125-148)/(71-87) 125/75 (08/30 0626) SpO2:  [95 %-100 %] 99 % (08/30 0626) Weight:  [69.4 kg] 69.4 kg (08/30 0116) Constitutional:  Alert, No acute distress HEENT:  AT, moist mucus membranes.  Trachea midline, no masses Cardiovascular: Regular rate and rhythm, no clubbing, cyanosis, or edema. Respiratory: Normal respiratory effort, lungs  clear bilaterally GI: Abdomen is soft, nontender, nondistended, no abdominal masses GU: No CVA tenderness Skin: No rashes, bruises or suspicious lesions Lymph: No cervical or inguinal adenopathy Neurologic: Grossly intact, no focal deficits, moving all 4 extremities Psychiatric: Normal mood and affect   Laboratory Data:  Recent Labs    03/28/22 0238  WBC 10.7*  HGB 13.4  HCT 40.9   Recent Labs    03/28/22 0238  NA 143  K 3.3*  CL 109  CO2 27  GLUCOSE 95  BUN 25*  CREATININE 0.87  CALCIUM 8.5*   No results for input(s): "LABPT", "INR" in the last 72 hours. No results for input(s): "LABURIN" in the last 72 hours. Results for orders placed or performed during the hospital encounter of 03/28/22  Resp Panel by RT-PCR (Flu A&B, Covid)     Status: None   Collection Time: 03/28/22  2:38 AM   Specimen: Nasal Swab  Result Value Ref Range Status   SARS Coronavirus 2 by RT PCR NEGATIVE NEGATIVE Final    Comment: (NOTE) SARS-CoV-2 target nucleic acids are NOT DETECTED.  The SARS-CoV-2 RNA is generally detectable in upper respiratory specimens during the acute phase of infection. The lowest concentration of SARS-CoV-2 viral copies this assay can detect is 138 copies/mL. A negative result does not preclude SARS-Cov-2 infection and should not be used as the sole basis for treatment or other patient management decisions. A negative result may occur with  improper specimen collection/handling, submission of specimen other than nasopharyngeal swab, presence of viral mutation(s) within the areas targeted by this assay, and inadequate number of viral copies(<138 copies/mL). A negative result must be combined with clinical observations, patient history, and epidemiological information. The expected result is Negative.  Fact Sheet for Patients:  BloggerCourse.com  Fact Sheet for Healthcare Providers:  SeriousBroker.it  This test is  no t yet approved or cleared by the [place] FDA and  has been authorized for detection and/or diagnosis of SARS-CoV-2 by FDA under an Emergency Use Authorization (EUA). This EUA will remain  in effect (meaning this test can be used) for the duration of the COVID-19 declaration under Section 564(b)(1) of the Act, 21 U.S.C.section 360bbb-3(b)(1), unless the authorization is terminated  or revoked sooner.       Influenza A by PCR NEGATIVE NEGATIVE Final   Influenza B by PCR NEGATIVE NEGATIVE Final    Comment: (NOTE) The Xpert Xpress SARS-CoV-2/FLU/RSV plus assay is intended as an aid in the diagnosis of influenza from Nasopharyngeal swab specimens and should not be used as a sole basis for treatment. Nasal washings and aspirates are unacceptable for Xpert Xpress SARS-CoV-2/FLU/RSV testing.  Fact Sheet for Patients: BloggerCourse.com  Fact Sheet for Healthcare Providers: SeriousBroker.it  This test is not yet approved or cleared by the [place] FDA and has been authorized for detection and/or diagnosis of SARS-CoV-2 by FDA under an Emergency Use Authorization (EUA). This EUA will remain in effect (meaning this test can be used) for the duration of the COVID-19 declaration under Section  564(b)(1) of the Act, 21 U.S.C. section 360bbb-3(b)(1), unless the authorization is terminated or revoked.  Performed at [facility], [address], [place], [place] [address]      Radiologic Imaging: CT images were personally reviewed and interpreted   DG OR UROLOGY CYSTO IMAGE [facility] ONLY)  Result Date: 03/28/2022 There is no interpretation for this exam.  This order is for images obtained during a surgical procedure.  Please See "Surgeries" Tab for more information regarding the procedure.   CT ABDOMEN PELVIS W CONTRAST  Result Date: 03/28/2022 CLINICAL DATA:  Left flank pain with kidney stone suspected. EXAM: CT  ABDOMEN AND PELVIS WITH CONTRAST TECHNIQUE: Multidetector CT imaging of the abdomen and pelvis was performed using the standard protocol following bolus administration of intravenous contrast. RADIATION DOSE REDUCTION: This exam was performed according to the departmental dose-optimization program which includes automated exposure control, adjustment of the mA and/or kV according to patient size and/or use of iterative reconstruction technique. CONTRAST:  OMNIPAQUE IOHEXOL 300 MG/ML  SOLN COMPARISON:  Renal ultrasound 11/25/2019 FINDINGS: Lower chest: Atelectasis and scarring at the lung bases when correlated with prior. Hepatobiliary: No focal liver abnormality.No evidence of biliary obstruction or stone. Pancreas: Unremarkable. Spleen: Unremarkable. Adrenals/Urinary Tract: Negative adrenals. Left hydroureteronephrosis related to 3 ureteral calculi measuring 1 cm at the level of L3-4, 6 mm at the distal ureter, and 4 mm also at the distal ureter. Numerous renal calculi on the left more than right. 2 cm left renal cyst which is grown from a 2021 CT. Right upper pole cyst is collapsed. Unremarkable bladder. Stomach/Bowel: No obstruction. No appendicitis. Colonic diverticulosis. Vascular/Lymphatic: No acute vascular abnormality. No mass or adenopathy. Reproductive:Myometrial masses, some with calcification, consistent with fibroids and measuring up to 3.3 cm. Other: No ascites or pneumoperitoneum. Musculoskeletal: No acute abnormalities. Generalized lumbar spine degeneration. IMPRESSION: 1. Left hydroureteronephrosis from 3 ureteral calculi. The largest calculus is in the proximal ureter measuring 1 cm at the level of L3-4. Two adjacent distal ureteral calculi measures 6 and 4 mm. 2. Innumerable renal calculi on the left more than right. 3. Colonic diverticulosis.  Uterine fibroids. Electronically Signed   By: Tiburcio Pea M.D.   On: 03/28/2022 04:19    Impression/Recommendation:  66 y.o. female with  moderate left hydronephrosis/hydroureter secondary to ureteral calculi Nitrite positive urine with significant pyuria and mild leukocytosis She has been admitted to hospitalist for IV antibiotic therapy Recommend cystoscopy with left ureteral stent placement.  We discussed that no attempt will be made to treat her ureteral calculi due to the possibility of bacteremia/worsening sepsis and she will need definitive stone treatment in the next several weeks I did recommend cystoscopy with placement of a left ureteral stent.  The procedure was discussed in detail including potential risks of bleeding, sepsis and ureteral injury.  We discussed that there is a remote chance that the stent could not be placed secondary to an impacted stone and if this were to occur she would need placement of a percutaneous nephrostomy tube All questions were answered and she desires to proceed    03/28/2022, 7:39 AM  Irineo Axon,  MD

## 2022-03-28 NOTE — Assessment & Plan Note (Addendum)
Patient with a history of nephrolithiasis who presents for evaluation of left flank pain.  Noted to have pyuria Prior urine culture yielded E. Coli Patient will be discharged home to complete a course of antibiotic therapy with Bactrim

## 2022-03-28 NOTE — ED Triage Notes (Signed)
Pt to ED from home c/o left back and flank pain x3 days intermittently getting worse, denies hematuria but has urinary frequency and decreased output. Hx of left sided kidney stones and feels similar.

## 2022-03-28 NOTE — Progress Notes (Unsigned)
Surgical Physician Order Form [facility]  * Scheduling expectation :  04/20/22  *Length of Case: 1.5-hour  *Clearance needed: no  *Anticoagulation Instructions: May continue all anticoagulants  *Aspirin Instructions: Ok to continue Aspirin  *Post-op visit Date/Instructions:   TBD  *Diagnosis: Left Ureteral Stone  *Procedure: left  Ureteroscopy w/laser lithotripsy & stent exchange (27782)   Additional orders: N/A  -Admit type: OUTpatient  -Anesthesia: General  -VTE Prophylaxis Standing Order SCD's       Other:   -Standing Lab Orders Per Anesthesia    Lab other: UA&Urine Culture sent 03/28/2022  -Standing Test orders EKG/Chest x-ray per Anesthesia       Test other:   - Medications:  Ancef 2gm IV  -Other orders:  N/A

## 2022-03-28 NOTE — Assessment & Plan Note (Signed)
Patient with a history of nephrolithiasis who presents for evaluation of left flank pain. CT scan of abdomen and pelvis shows Left hydroureteronephrosis from 3 ureteral calculi. The largest calculus is in the proximal ureter measuring 1 cm at the level of L3-4. Two adjacent distal ureteral calculi measures 6 and 4 mm. 2. Innumerable renal calculi on the left more than right. Patient was seen in consultation by urology and is status post left ureteral stent placement for a 14 mm left proximal ureteral calculus. Patient's prior stone analysis was calcium phosphate/calcium oxalate.  She had hypercalciuria and low urine volume.  Indapamide was discussed however she declined medical management of her hypercalciuria. Discussed with urologist who states that patient can be discharged home to follow-up as an outpatient.

## 2022-03-28 NOTE — Discharge Summary (Signed)
Physician Discharge Summary   Patient: Courtney Cochran MRN: [id number] DOB: Apr 05, 1956  Admit date:     03/28/2022  Discharge date: 03/28/22  Discharge Physician: Esmae Donathan   PCP: Lynnea Ferrier, MD   Recommendations at discharge:   Complete antibiotic therapy as recommended Follow-up with urology as an outpatient.  Discharge Diagnoses: Principal Problem:   Acute unilateral obstructive uropathy Active Problems:   Urinary tract infection without hematuria  Resolved Problems:   * No resolved hospital problems. *  Hospital Course: No notes on file  Courtney Cochran is a 66 y.o. female with medical history significant for nephrolithiasis, GERD who presented to the ER for evaluation of 3-day history of left back and flank pain which was progressively worsening associated with urinary frequency.  Patient has a history of left-sided kidney stones and states that her symptoms today feel similar to her prior episodes.  She rated her pain a 4 x 10 in intensity at its worst. She denies having any fever or chills and denies having any hematuria. She denies having any chest pain, no shortness of breath, no nausea, no vomiting, no dizziness, no lightheadedness, no headache, no blurred vision or focal deficit. She had a CT scan of the abdomen and pelvis without contrast which showed Left hydroureteronephrosis from 3 ureteral calculi. The largest calculus is in the proximal ureter measuring 1 cm at the level of L3-4. Two adjacent distal ureteral calculi measures 6 and 4 mm. Innumerable renal calculi on the left more than right. Patient was noted to have pyuria and some mild leukocytosis. Urology was consulted in the ER and she was taken emergently to the OR for ureteral stent placement.   Assessment and Plan       Consultants: Urology Procedures performed: Left ureteral stent placement Disposition: Home Diet recommendation:  Carb modified diet DISCHARGE  MEDICATION: Allergies as of 03/28/2022   No Known Allergies      Medication List     TAKE these medications    acetaminophen 325 MG tablet Commonly known as: TYLENOL Take 325 mg by mouth as needed.   BIOTIN PO Take by mouth daily.   calcium carbonate 500 MG chewable tablet Commonly known as: TUMS - dosed in mg elemental calcium Chew 1 tablet by mouth daily.   cholecalciferol 1000 units tablet Commonly known as: VITAMIN D Take 5,000 Units by mouth daily.   Cranberry 1000 MG Caps Take 1,000 mg by mouth daily.   Fish Oil 1000 MG Caps Take 1,000 mg by mouth daily.   gabapentin 100 MG capsule Commonly known as: NEURONTIN Take 100 mg by mouth 3 (three) times daily.   magnesium gluconate 500 MG tablet Commonly known as: MAGONATE Take 500 mg by mouth daily.   metFORMIN 500 MG tablet Commonly known as: GLUCOPHAGE Take 1,000 mg by mouth once.   Multi-Vitamin tablet Take 1 tablet by mouth daily.   oxybutynin 5 MG tablet Commonly known as: DITROPAN 1 tab tid prn frequency,urgency, bladder spasm   sucralfate 1 g tablet Commonly known as: CARAFATE Take 1 g by mouth daily.   sulfamethoxazole-trimethoprim 800-160 MG tablet Commonly known as: BACTRIM DS Take 1 tablet by mouth 2 (two) times daily for 14 days.   tamsulosin 0.4 MG Caps capsule Commonly known as: FLOMAX Take 1 capsule (0.4 mg total) by mouth daily after breakfast.        Follow-up Information     Stoioff, Verna Czech, MD Follow up.   Specialty: Urology Contact information:  [address] [place] [address] [phone]                Discharge Exam: Ceasar Mons Weights   03/28/22 0116 03/28/22 0745  Weight: 69.4 kg 69.4 kg   Physical Exam Vitals and nursing note reviewed.  Constitutional:      Appearance: Normal appearance.  HENT:     Head: Normocephalic.     Nose: Nose normal.     Mouth/Throat:     Mouth: Mucous membranes are moist.  Eyes:     Pupils: Pupils are  equal, round, and reactive to light.  Cardiovascular:     Rate and Rhythm: Normal rate and regular rhythm.  Pulmonary:     Effort: Pulmonary effort is normal.     Breath sounds: Normal breath sounds.  Abdominal:     General: Abdomen is flat. Bowel sounds are normal.     Palpations: Abdomen is soft.  Musculoskeletal:        General: Normal range of motion.     Cervical back: Normal range of motion and neck supple.  Skin:    General: Skin is warm and dry.  Neurological:     General: No focal deficit present.     Mental Status: She is alert.  Psychiatric:        Mood and Affect: Mood normal.        Behavior: Behavior normal.       Condition at discharge: good  The results of significant diagnostics from this hospitalization (including imaging, microbiology, ancillary and laboratory) are listed below for reference.   Imaging Studies: DG OR UROLOGY CYSTO IMAGE ([facility] ONLY)  Result Date: 03/28/2022 There is no interpretation for this exam.  This order is for images obtained during a surgical procedure.  Please See "Surgeries" Tab for more information regarding the procedure.   CT ABDOMEN PELVIS W CONTRAST  Result Date: 03/28/2022 CLINICAL DATA:  Left flank pain with kidney stone suspected. EXAM: CT ABDOMEN AND PELVIS WITH CONTRAST TECHNIQUE: Multidetector CT imaging of the abdomen and pelvis was performed using the standard protocol following bolus administration of intravenous contrast. RADIATION DOSE REDUCTION: This exam was performed according to the departmental dose-optimization program which includes automated exposure control, adjustment of the mA and/or kV according to patient size and/or use of iterative reconstruction technique. CONTRAST:  OMNIPAQUE IOHEXOL 300 MG/ML  SOLN COMPARISON:  Renal ultrasound 11/25/2019 FINDINGS: Lower chest: Atelectasis and scarring at the lung bases when correlated with prior. Hepatobiliary: No focal liver abnormality.No evidence of biliary  obstruction or stone. Pancreas: Unremarkable. Spleen: Unremarkable. Adrenals/Urinary Tract: Negative adrenals. Left hydroureteronephrosis related to 3 ureteral calculi measuring 1 cm at the level of L3-4, 6 mm at the distal ureter, and 4 mm also at the distal ureter. Numerous renal calculi on the left more than right. 2 cm left renal cyst which is grown from a 2021 CT. Right upper pole cyst is collapsed. Unremarkable bladder. Stomach/Bowel: No obstruction. No appendicitis. Colonic diverticulosis. Vascular/Lymphatic: No acute vascular abnormality. No mass or adenopathy. Reproductive:Myometrial masses, some with calcification, consistent with fibroids and measuring up to 3.3 cm. Other: No ascites or pneumoperitoneum. Musculoskeletal: No acute abnormalities. Generalized lumbar spine degeneration. IMPRESSION: 1. Left hydroureteronephrosis from 3 ureteral calculi. The largest calculus is in the proximal ureter measuring 1 cm at the level of L3-4. Two adjacent distal ureteral calculi measures 6 and 4 mm. 2. Innumerable renal calculi on the left more than right. 3. Colonic diverticulosis.  Uterine fibroids. Electronically  Signed   By: Tiburcio Pea M.D.   On: 03/28/2022 04:19    Microbiology: Results for orders placed or performed during the hospital encounter of 03/28/22  Resp Panel by RT-PCR (Flu A&B, Covid)     Status: None   Collection Time: 03/28/22  2:38 AM   Specimen: Nasal Swab  Result Value Ref Range Status   SARS Coronavirus 2 by RT PCR NEGATIVE NEGATIVE Final    Comment: (NOTE) SARS-CoV-2 target nucleic acids are NOT DETECTED.  The SARS-CoV-2 RNA is generally detectable in upper respiratory specimens during the acute phase of infection. The lowest concentration of SARS-CoV-2 viral copies this assay can detect is 138 copies/mL. A negative result does not preclude SARS-Cov-2 infection and should not be used as the sole basis for treatment or other patient management decisions. A negative result  may occur with  improper specimen collection/handling, submission of specimen other than nasopharyngeal swab, presence of viral mutation(s) within the areas targeted by this assay, and inadequate number of viral copies(<138 copies/mL). A negative result must be combined with clinical observations, patient history, and epidemiological information. The expected result is Negative.  Fact Sheet for Patients:  BloggerCourse.com  Fact Sheet for Healthcare Providers:  SeriousBroker.it  This test is no t yet approved or cleared by the [place] FDA and  has been authorized for detection and/or diagnosis of SARS-CoV-2 by FDA under an Emergency Use Authorization (EUA). This EUA will remain  in effect (meaning this test can be used) for the duration of the COVID-19 declaration under Section 564(b)(1) of the Act, 21 U.S.C.section 360bbb-3(b)(1), unless the authorization is terminated  or revoked sooner.       Influenza A by PCR NEGATIVE NEGATIVE Final   Influenza B by PCR NEGATIVE NEGATIVE Final    Comment: (NOTE) The Xpert Xpress SARS-CoV-2/FLU/RSV plus assay is intended as an aid in the diagnosis of influenza from Nasopharyngeal swab specimens and should not be used as a sole basis for treatment. Nasal washings and aspirates are unacceptable for Xpert Xpress SARS-CoV-2/FLU/RSV testing.  Fact Sheet for Patients: BloggerCourse.com  Fact Sheet for Healthcare Providers: SeriousBroker.it  This test is not yet approved or cleared by the [place] FDA and has been authorized for detection and/or diagnosis of SARS-CoV-2 by FDA under an Emergency Use Authorization (EUA). This EUA will remain in effect (meaning this test can be used) for the duration of the COVID-19 declaration under Section 564(b)(1) of the Act, 21 U.S.C. section 360bbb-3(b)(1), unless the authorization is terminated  or revoked.  Performed at [facility], [address] [place], [place] [address]     Labs: CBC: Recent Labs  Lab 03/28/22 0238  WBC 10.7*  NEUTROABS 8.0*  HGB 13.4  HCT 40.9  MCV 93.4  PLT 247   Basic Metabolic Panel: Recent Labs  Lab 03/28/22 0238  NA 143  K 3.3*  CL 109  CO2 27  GLUCOSE 95  BUN 25*  CREATININE 0.87  CALCIUM 8.5*   Liver Function Tests: Recent Labs  Lab 03/28/22 0238  AST 22  ALT 22  ALKPHOS 80  BILITOT 1.1  PROT 6.6  ALBUMIN 3.5   CBG: No results for input(s): "GLUCAP" in the last 168 hours.  Discharge time spent: greater than 30 minutes.  Signed: Lucile Shutters, MD [facility] 03/28/2022

## 2022-03-28 NOTE — Op Note (Signed)
Preoperative diagnosis:  Left ureteral calculi Moderate to severe hydronephrosis secondary to above Pyuria  Postoperative diagnosis:  Same  Procedure: Cystoscopy with left ureteral stent placement Intraoperative fluoroscopy <30 minutes  Surgeon: Riki Altes, MD  Anesthesia: General  Complications: None  Intraoperative findings:  Cystoscopy-mild erythematous bladder mucosa; cloudy urine in bladder.  UOs normal-appearing bilaterally  EBL: Minimal  Specimens: Urine left renal pelvis for culture  Indication: Courtney Cochran is a 66 y.o. female with a history of stone disease who presented to the ED early this morning complaining of mild-moderate left flank pain.  Urinalysis was nitrite positive with >50 WBCs.  Mild leukocytosis 10.7.  CT showed a 10 mm left proximal ureteral calculus and 4 and 6 mm left distal ureteral calculi.  After reviewing the management options for treatment, she elected to proceed with the above surgical procedure(s). We have discussed the potential benefits and risks of the procedure, side effects of the proposed treatment, the likelihood of the patient achieving the goals of the procedure, and any potential problems that might occur during the procedure or recuperation. Informed consent has been obtained.  Description of procedure:  The patient was taken to the operating room and general anesthesia was induced.  The patient was placed in the dorsal lithotomy position, prepped and draped in the usual sterile fashion, and preoperative antibiotics were administered. A preoperative time-out was performed.   A 21 French cystoscope was lubricated and passed per urethra.  Panendoscopy was performed with findings as described above.  Contrast from prior CT was visible on fluoroscopy and there was moderate hydronephrosis and hydroureter.  A 6 French open-ended ureteral catheter was positioned at the left UO and a 0.038 Sensor wire was placed through the  catheter and into the left ureteral orifice.  The guidewire was easily advanced to the left renal pelvis under fluoroscopic guidance.  The open ended ureteral catheter was then advanced over the guidewire to the renal pelvis.  The guidewire was removed and 3 cc of slightly cloudy urine was aspirated and sent for culture.  The guidewire was replaced and the ureteral catheter was removed.  A 29F/24 cm Contour ureteral stent was then advanced over the guidewire to the renal pelvis.  The guidewire was removed and a good curl was noted in the renal pelvis under fluoroscopy and the distal end of stent was well positioned in the bladder.  All instruments were removed.  She was transported to the PACU in stable condition.  Plan: She was placed under observation by the hospitalist service.  She had been afebrile with stable vital signs pre and postoperatively.  She was discharged on oral antibiotics and urine cultures will be followed up Will schedule follow-up definitive stone treatment with ureteroscopy/laser lithotripsy with Dr. Richardo Hanks in approximately 2 weeks   Riki Altes, M.D.

## 2022-03-28 NOTE — Anesthesia Preprocedure Evaluation (Signed)
Anesthesia Evaluation  Patient identified by MRN, date of birth, ID band Patient awake    Reviewed: Allergy & Precautions, NPO status , Patient's Chart, lab work & pertinent test results  History of Anesthesia Complications (+) Family history of anesthesia reactionNegative for: history of anesthetic complications (sister with awareness under anesthesia)  Airway Mallampati: II       Dental  (+) Dental Advidsory Given, Teeth Intact   Pulmonary neg sleep apnea, pneumonia (COVID pneumonia 3 weeks ago, aysmptomatic at that time), neg COPD, Patient abstained from smoking.Not current smoker, former smoker,           Cardiovascular (-) hypertension(-) Past MI and (-) CHF (-) dysrhythmias (-) Valvular Problems/Murmurs     Neuro/Psych neg Seizures    GI/Hepatic Neg liver ROS, GERD  Medicated and Controlled,  Endo/Other  neg diabetes  Renal/GU Renal InsufficiencyRenal disease     Musculoskeletal   Abdominal   Peds  Hematology  (+) Blood dyscrasia, anemia ,   Anesthesia Other Findings   Reproductive/Obstetrics                             Anesthesia Physical  Anesthesia Plan  ASA: II  Anesthesia Plan: General   Post-op Pain Management:    Induction: Intravenous  PONV Risk Score and Plan: 3 and Ondansetron and Midazolam  Airway Management Planned: Oral ETT  Additional Equipment:   Intra-op Plan:   Post-operative Plan: Extubation in OR  Informed Consent: I have reviewed the patients History and Physical, chart, labs and discussed the procedure including the risks, benefits and alternatives for the proposed anesthesia with the patient or authorized representative who has indicated his/her understanding and acceptance.     Dental Advisory Given  Plan Discussed with: Anesthesiologist, CRNA and Surgeon  Anesthesia Plan Comments: (Patient consented for risks of anesthesia including but not  limited to:  - adverse reactions to medications - damage to eyes, teeth, lips or other oral mucosa - nerve damage due to positioning  - sore throat or hoarseness - Damage to heart, brain, nerves, lungs, other parts of body or loss of life  Patient voiced understanding.)        Anesthesia Quick Evaluation

## 2022-03-28 NOTE — H&P (Signed)
History and Physical    Patient: Courtney Cochran HEN:[id number] DOB: 1956-01-20 DOA: 03/28/2022 DOS: the patient was seen and examined on 03/28/2022 PCP: Adin Hector, MD  Patient coming from: Home  Chief Complaint:  Chief Complaint  Patient presents with   Back Pain   HPI: Courtney Cochran is a 66 y.o. female with medical history significant for nephrolithiasis, GERD who presented to the ER for evaluation of 3-day history of left back and flank pain which was progressively worsening associated with urinary frequency.  Patient has a history of left-sided kidney stones and states that her symptoms today feel similar to her prior episodes.  She rated her pain a 4 x 10 in intensity at its worst. She denies having any fever or chills and denies having any hematuria. She denies having any chest pain, no shortness of breath, no nausea, no vomiting, no dizziness, no lightheadedness, no headache, no blurred vision or focal deficit. She had a CT scan of the abdomen and pelvis without contrast which showed Left hydroureteronephrosis from 3 ureteral calculi. The largest calculus is in the proximal ureter measuring 1 cm at the level of L3-4. Two adjacent distal ureteral calculi measures 6 and 4 mm. Innumerable renal calculi on the left more than right. Patient was noted to have pyuria and some mild leukocytosis. Urology was consulted in the ER and she was taken emergently to the OR for ureteral stent placement.     Review of Systems: As mentioned in the history of present illness. All other systems reviewed and are negative. Past Medical History:  Diagnosis Date   Anemia    H/O 18 YEARS AGO   Chronic kidney disease    STONES   COVID-19 09/11/2019   remdesivir infusions   Family history of adverse reaction to anesthesia    PTS SISTER HAS WOKEN UP TWICE DURING SURGERY   Foot injury    LEFT-FOOT BRUISED AND SWOLLEN PER PT(RELATED TO HER FALL THAT BROKE HER WRIST)   GERD  (gastroesophageal reflux disease)    RARE   Herpes simplex virus infection    recurrent   History of kidney stones 08/2019   Multiple lung nodules on CT    stable since 2017   Osteopenia    Pneumonia 08/2019   due to +Covid   Past Surgical History:  Procedure Laterality Date   COLONOSCOPY     CYSTOSCOPY W/ RETROGRADES Left 10/02/2019   Procedure: CYSTOSCOPY WITH RETROGRADE PYELOGRAM;  Surgeon: Billey Co, MD;  Location: [facility];  Service: Urology;  Laterality: Left;   CYSTOSCOPY WITH STENT PLACEMENT Left 09/11/2019   Procedure: CYSTOSCOPY WITH STENT PLACEMENT;  Surgeon: Billey Co, MD;  Location: [facility];  Service: Urology;  Laterality: Left;   CYSTOSCOPY/URETEROSCOPY/HOLMIUM LASER/STENT PLACEMENT Left 10/02/2019   Procedure: CYSTOSCOPY/URETEROSCOPY/HOLMIUM LASER/STENT EXCHANGE;  Surgeon: Billey Co, MD;  Location: [facility];  Service: Urology;  Laterality: Left;   DILATION AND CURETTAGE OF UTERUS     KIDNEY STONE SURGERY     OPEN REDUCTION INTERNAL FIXATION (ORIF) DISTAL RADIAL FRACTURE Right 02/22/2016   Procedure: OPEN REDUCTION INTERNAL FIXATION (ORIF) DISTAL RADIAL FRACTURE;  Surgeon: Corky Mull, MD;  Location: [place];  Service: Orthopedics;  Laterality: Right;   Social History:  reports that she quit smoking about 46 years ago. Her smoking use included cigarettes. She has a 4.00 pack-year smoking history. She has never used smokeless tobacco. She reports current alcohol use. She reports that she does not use  drugs.  No Known Allergies  Family History  Problem Relation Age of Onset   CAD Mother    CAD Father    Cancer Brother    Breast cancer Neg Hx     Prior to Admission medications   Medication Sig Start Date End Date Taking? Authorizing Provider  acetaminophen (TYLENOL) 325 MG tablet Take 325 mg by mouth as needed.   Yes [provider]  BIOTIN PO Take by mouth daily.   Yes [provider]  calcium carbonate (TUMS - DOSED  IN MG ELEMENTAL CALCIUM) 500 MG chewable tablet Chew 1 tablet by mouth daily.   Yes [provider]  cholecalciferol (VITAMIN D) 1000 units tablet Take 5,000 Units by mouth daily.   Yes [provider]  Cranberry 1000 MG CAPS Take 1,000 mg by mouth daily.   Yes [provider]  metFORMIN (GLUCOPHAGE) 500 MG tablet Take 1,000 mg by mouth once.   Yes [provider]  Multiple Vitamin (MULTI-VITAMIN) tablet Take 1 tablet by mouth daily.    Yes [provider]  Omega-3 Fatty Acids (FISH OIL) 1000 MG CAPS Take 1,000 mg by mouth daily.    Yes [provider]  gabapentin (NEURONTIN) 100 MG capsule Take 100 mg by mouth 3 (three) times daily. Patient not taking: Reported on 03/28/2022 05/16/21   [provider]  magnesium gluconate (MAGONATE) 500 MG tablet Take 500 mg by mouth daily. Patient not taking: Reported on 03/28/2022    [provider]  sucralfate (CARAFATE) 1 g tablet Take 1 g by mouth daily.  Patient not taking: Reported on 03/28/2022 11/23/19   [provider]    Physical Exam: Vitals:   03/28/22 0900 03/28/22 0915 03/28/22 0930 03/28/22 0945  BP: 116/67 118/72 117/67 124/78  Pulse: 78 78 70 78  Resp: _0 Temp:      TempSrc:      SpO2: 99% 100% 97% 98%  Weight:      Height:       Physical Exam Vitals and nursing note reviewed.  Constitutional:      Appearance: Normal appearance.  HENT:     Head: Normocephalic.     Nose: Nose normal.     Mouth/Throat:     Mouth: Mucous membranes are moist.  Eyes:     Pupils: Pupils are equal, round, and reactive to light.  Cardiovascular:     Rate and Rhythm: Normal rate and regular rhythm.  Pulmonary:     Effort: Pulmonary effort is normal.     Breath sounds: Normal breath sounds.  Abdominal:     General: Abdomen is flat. Bowel sounds are normal.     Palpations: Abdomen is soft.  Musculoskeletal:        General: Normal range of motion.     Cervical  back: Normal range of motion and neck supple.  Skin:    General: Skin is warm and dry.  Neurological:     General: No focal deficit present.     Mental Status: She is alert.  Psychiatric:        Mood and Affect: Mood normal.        Behavior: Behavior normal.     Data Reviewed: Relevant notes from primary care and specialist visits, past discharge summaries as available in EHR, including Care Everywhere. Prior diagnostic testing as pertinent to current admission diagnoses Updated medications and problem lists for reconciliation ED course, including vitals, labs, imaging, treatment and response to  treatment Triage notes, nursing and pharmacy notes and ED provider's notes Notable results as noted in HPI Labs reviewed.  Troponin 7, lipase 30, sodium 143, potassium 3.3, chloride 109, bicarb 27, glucose 95, BUN 25, creatinine 0.87, calcium 8.5, total protein 6.6, albumin 3.5, AST 22, ALT 22, alk phos 80, total bilirubin 1.1, white count 10.7, hemoglobin 13.4, hematocrit 40.9, RDW 12.5, platelet count 247 CT scan of abdomen and pelvis showed left hydroureteronephrosis from 3 ureteral calculi. The largest calculus is in the proximal ureter measuring 1 cm at the level of L3-4. Two adjacent distal ureteral calculi measures 6 and 4 mm. Innumerable renal calculi on the left more than right. Colonic diverticulosis.  Uterine fibroids. There are no new results to review at this time.  Assessment and Plan: * Acute unilateral obstructive uropathy Patient with a history of nephrolithiasis who presents for evaluation of left flank pain. CT scan of abdomen and pelvis shows Left hydroureteronephrosis from 3 ureteral calculi. The largest calculus is in the proximal ureter measuring 1 cm at the level of L3-4. Two adjacent distal ureteral calculi measures 6 and 4 mm. 2. Innumerable renal calculi on the left more than right. Patient was seen in consultation by urology and is status post left ureteral stent  placement for a 14 mm left proximal ureteral calculus. Patient's prior stone analysis was calcium phosphate/calcium oxalate.  She had hypercalciuria and low urine volume.  Indapamide was discussed however she declined medical management of her hypercalciuria. Discussed with urologist who states that patient can be discharged home to follow-up as an outpatient.  Urinary tract infection without hematuria Patient with a history of nephrolithiasis who presents for evaluation of left flank pain.  Noted to have pyuria Prior urine culture yielded E. Coli We will send patient home on a course of Keflex 500 mg p.o. twice daily       Advance Care Planning:   Code Status: Prior Full code  Consults: Urology  Family Communication: Greater than 50% of time was spent discussing patient's condition and plan of care with her at the bedside.  All questions and concerns have been addressed.  She verbalizes understanding and agrees with the plan.  Severity of Illness: The appropriate patient status for this patient is OBSERVATION. Observation status is judged to be reasonable and necessary in order to provide the required intensity of service to ensure the patient's safety. The patient's presenting symptoms, physical exam findings, and initial radiographic and laboratory data in the context of their medical condition is felt to place them at decreased risk for further clinical deterioration. Furthermore, it is anticipated that the patient will be medically stable for discharge from the hospital within 2 midnights of admission.   Author: Collier Bullock, MD 03/28/2022 10:00 AM  For on call review [email or web address].

## 2022-03-28 NOTE — ED Notes (Signed)
Patient transported to CT 

## 2022-03-28 NOTE — Transfer of Care (Signed)
Immediate Anesthesia Transfer of Care Note  Patient: Courtney Cochran  Procedure(s) Performed: CYSTOSCOPY WITH STENT REPLACEMENT (Left: Ureter)  Patient Location: PACU  Anesthesia Type:General  Level of Consciousness: drowsy  Airway & Oxygen Therapy: Patient Spontanous Breathing and Patient connected to face mask oxygen  Post-op Assessment: Report given to RN  Post vital signs: stable  Last Vitals:  Vitals Value Taken Time  BP    Temp    Pulse    Resp    SpO2      Last Pain:  Vitals:   03/28/22 0745  TempSrc: Temporal  PainSc: 0-No pain         Complications: No notable events documented.

## 2022-03-29 ENCOUNTER — Telehealth: Payer: Self-pay

## 2022-03-29 NOTE — Anesthesia Postprocedure Evaluation (Signed)
Anesthesia Post Note  Patient: Courtney Cochran  Procedure(s) Performed: CYSTOSCOPY WITH STENT REPLACEMENT (Left: Ureter)  Patient location during evaluation: PACU Anesthesia Type: General Level of consciousness: awake and alert Pain management: pain level controlled Vital Signs Assessment: post-procedure vital signs reviewed and stable Respiratory status: spontaneous breathing, nonlabored ventilation, respiratory function stable and patient connected to nasal cannula oxygen Cardiovascular status: blood pressure returned to baseline and stable Postop Assessment: no apparent nausea or vomiting Anesthetic complications: no   No notable events documented.   Last Vitals:  Vitals:   03/28/22 1015 03/28/22 1025  BP: 123/78 (!) 140/71  Pulse: 74 85  Resp: 16 15  Temp: (!) 36.3 C 37 C  SpO2: 100% 95%    Last Pain:  Vitals:   03/28/22 1025  TempSrc: Temporal  PainSc: 0-No pain                 Stephanie Coup

## 2022-03-29 NOTE — Telephone Encounter (Signed)
I spoke with Courtney Cochran. We have discussed possible surgery dates and Friday September 22nd, 2023 was agreed upon by all parties. Patient given information about surgery date, what to expect pre-operatively and post operatively.  We discussed that a Pre-Admission Testing office will be calling to set up the pre-op visit that will take place prior to surgery, and that these appointments are typically done over the phone with a Pre-Admissions RN.  Informed patient that our office will communicate any additional care to be provided after surgery. Patients questions or concerns were discussed during our call. Advised to call our office should there be any additional information, questions or concerns that arise. Patient verbalized understanding.

## 2022-03-29 NOTE — Progress Notes (Signed)
[facility] Posting Form   Surgery Date/Time: Date: 04/20/2022  Surgeon: Dr. Legrand Rams, MD  Surgery Location: Day Surgery  Inpt ( No  )   Outpt (Yes)   Obs ( No  )   Diagnosis: N20.1 Left Ureteral Stone  -CPT: [phone]  Surgery: Left Ureteroscopy with laser lithotripsy and stent exchange  Stop Anticoagulations: No, may continue ASA  Cardiac/Medical/Pulmonary Clearance needed: no  *Orders entered into EPIC  Date: 03/29/22   *Case booked in [place]  Date: 03/28/2022  *Notified pt of Surgery: Date: 03/28/2022  PRE-OP UA & CX: yes, obtained and sent on 03/28/2022  *Placed into Prior Authorization Work Angela Nevin Date: 03/29/22   Assistant/laser/rep:No

## 2022-03-30 LAB — URINE CULTURE
Culture: >=100000 — AB
Culture: 10000 — AB

## 2022-04-09 ENCOUNTER — Encounter
Admission: RE | Admit: 2022-04-09 | Discharge: 2022-04-09 | Disposition: A | Discharge status: Home / Self Care | Payer: BC Managed Care – PPO | Source: Ambulatory Visit | Attending: Urology | Admitting: Urology

## 2022-04-09 VITALS — Ht 68.0 in | Wt 150.0 lb

## 2022-04-09 DIAGNOSIS — Z8639 Personal history of other endocrine, nutritional and metabolic disease: Secondary | ICD-10-CM

## 2022-04-09 DIAGNOSIS — Z01812 Encounter for preprocedural laboratory examination: Secondary | ICD-10-CM

## 2022-04-09 HISTORY — DX: Hyperlipidemia, unspecified: E78.5

## 2022-04-09 HISTORY — DX: Sepsis, unspecified organism: A41.9

## 2022-04-09 HISTORY — DX: Diverticulosis of intestine, part unspecified, without perforation or abscess without bleeding: K57.90

## 2022-04-09 NOTE — Pre-Procedure Instructions (Signed)
Pre-op interview completed. Informed that last K+ level was low (3.3) on March 28, 2022 and will need to be rechecked prior to upcoming surgery. Patient out of state and will not be returning until the night before surgery. Informed patient that K+ will be checked on the day of surgery and also given information on potassium containing foods. Acknowledged understanding.

## 2022-04-09 NOTE — Patient Instructions (Addendum)
Your procedure is scheduled on: Friday, September 22 Report to the Registration Desk on the 1st floor of the [facility] To find out your arrival time, please call [phone] between 1PM - 3PM on: Thursday, September 21 If your arrival time is 6:00 am, do not arrive prior to that time as the [facility] entrance doors do not open until 6:00 am.  REMEMBER: Instructions that are not followed completely may result in serious medical risk, up to and including death; or upon the discretion of your surgeon and anesthesiologist your surgery may need to be rescheduled.  Do not eat or drink after midnight the night before surgery.  No gum chewing, lozengers or hard candies.  DO NOT TAKE ANY MEDICATIONS THE MORNING OF SURGERY  Metformin - hold for 2 days prior to surgery. Last day to take METFORMIN is Tuesday, September 19. Resume AFTER surgery.  One week prior to surgery: starting September 15 Stop Anti-inflammatories (NSAIDS) such as Advil, Aleve, Ibuprofen, Motrin, Naproxen, Naprosyn and Aspirin based products such as Excedrin, Goodys Powder, BC Powder. Stop ANY OVER THE COUNTER supplements until after surgery. Stop Vitamin D, cranberry, fish oil, multiple vitamins. You may however, continue to take Tylenol if needed for pain up until the day of surgery.  No Alcohol for 24 hours before or after surgery.  No Smoking including e-cigarettes for 24 hours prior to surgery.  No chewable tobacco products for at least 6 hours prior to surgery.  No nicotine patches on the day of surgery.  Do not use any "recreational" drugs for at least a week prior to your surgery.  Please be advised that the combination of cocaine and anesthesia may have negative outcomes, up to and including death. If you test positive for cocaine, your surgery will be cancelled.  On the morning of surgery brush your teeth with toothpaste and water, you may rinse your mouth with mouthwash if you wish. Do not swallow any  toothpaste or mouthwash.  Do not wear jewelry, make-up, hairpins, clips or nail polish.  Do not wear lotions, powders, or perfumes.   Do not shave body from the neck down 48 hours prior to surgery just in case you cut yourself which could leave a site for infection.   Contact lenses, hearing aids and dentures may not be worn into surgery.  Do not bring valuables to the hospital. [facility] is not responsible for any missing/lost belongings or valuables.   Notify your doctor if there is any change in your medical condition (cold, fever, infection).  Wear comfortable clothing (specific to your surgery type) to the hospital.  After surgery, you can help prevent lung complications by doing breathing exercises.  Take deep breaths and cough every 1-2 hours. Your doctor may order a device called an Incentive Spirometer to help you take deep breaths.  If you are being discharged the day of surgery, you will not be allowed to drive home. You will need a responsible adult (18 years or older) to drive you home and stay with you that night.   If you are taking public transportation, you will need to have a responsible adult (18 years or older) with you. Please confirm with your physician that it is acceptable to use public transportation.   Please call the Pre-admissions Testing Dept. at [phone] if you have any questions about these instructions.  Surgery Visitation Policy:  Patients undergoing a surgery or procedure may have two family members or support persons with them as long  as the person is not COVID-19 positive or experiencing its symptoms.    Potassium Content of Foods  Potassium is a mineral found in many foods and drinks. It can affect how the heart works, affect blood pressure, and keep fluids and electrolytes balanced in the body. It is important not to have too much potassium (hyperkalemia) or too little potassium (hypokalemia) in the body, especially in the  blood. Potassium is naturally found in many different types of whole foods, such as fruits, vegetables, meat, and dairy products. Processed foods tend to be lower in potassium. The amount of potassium you need each day depends on your age and any medical conditions you may have. General recommendations are: Females aged 69 and older: 2,600 mg per day. Males aged 70 and older: 3,400 mg per day. Talk with your health care provider or dietitian about how much potassium you need. What foods are high in potassium? Below are examples of foods that have greater than 200 mg of potassium per serving. Fruits Orange -- 1 medium (130 g) has 230 mg of potassium. Banana -- 1 medium (120 g) has 420 mg of potassium. Cantaloupe, chunks -- 1 cup (160 g) has 430 mg of potassium. Vegetables Potato, baked, without skin -- 1 medium (170 g) has 600 mg of potassium. Broccoli, chopped, cooked --  cup (77.5 g) has 230 mg of potassium. Tomato, chopped or sliced -- 1 cup (152 g) has 400 mg of potassium. Grains Cereal, bran with raisins -- 1 cup (59 g) has 360 mg of potassium. Granola with almonds --  cup (82 g) has 220 mg of potassium. Meats and other proteins Ground beef patty -- 4 ounces (113 g) has 240 mg of potassium. Kidney beans, boiled --  cup (130 g) has 350 mg of potassium. Almonds -- 1 ounce (approximately 22 nuts or 28 g) has 200 mg of potassium. Dairy Cow's milk, 1% -- 1 cup (237 mL) has 360 mg of potassium. Plain vanilla low-fat yogurt --  cup (184 g) has 220 mg of potassium. The items listed above may not be a complete list of foods high in potassium. Actual amounts of potassium may be different depending on ripeness, shelf life, and food preparation. Contact a dietitian for more information. What foods are low in potassium? Below are examples of foods that have less than 200 mg of potassium per serving. Fruits Blueberries -- 1 cup (145 g) has 110 mg of potassium. Apple -- 1 medium (140 g) has  145 mg of potassium. Grapes -- 1 cup (160 g) has 175 mg of potassium. Vegetables Cabbage, raw -- 1 cup (70 g) has 120 mg of potassium. Cauliflower, chopped, cooked -- 1 cup (180 g) has 90 mg of potassium. Romaine lettuce, chopped -- 1 cup (56 g) has 120 mg of potassium. Grains Bagel, plain -- one 4-inch (10 cm) has 100 mg of potassium. Whole wheat bread -- 1 slice (26 g) has 70 mg of potassium. White rice, cooked -- 1 cup (163 g) has 50 mg of potassium. Meats and other proteins Tuna, light, canned in water -- 3 ounces (85 g) has 150 mg of potassium. Egg, fried -- 1 large (50 g) has 60 mg of potassium. Peanuts --1 ounce (35 nuts or 28 g) has 180 mg of potassium. Tofu --  cup (252 g) has 150 mg of potassium. Dairy Cheese (cheddar, colby, mozzarella, or provolone) -- 1 ounce (28 g) has 30 to 40 mg of potassium. The items listed above may  not be a complete list of foods that are low in potassium. Actual amounts of potassium may be different depending on ripeness, shelf life, and food preparation. Contact a dietitian for more information. Summary Potassium is a mineral found in many foods and drinks. It affects how the heart works, affects blood pressure, and keeps fluids and electrolytes balanced in the body. The amount of potassium you need each day depends on your age and any existing medical conditions you may have. Your health care provider or dietitian may recommend an amount of potassium that you should have each day. This information is not intended to replace advice given to you by your health care provider. Make sure you discuss any questions you have with your health care provider. Document Revised: 04/18/2021 Document Reviewed: 03/30/2021 Elsevier Patient Education  2023 [employer].

## 2022-04-11 ENCOUNTER — Telehealth: Payer: Self-pay | Admitting: Urology

## 2022-04-11 MED ORDER — TAMSULOSIN HCL 0.4 MG PO CAPS: 0.4000 mg | ORAL_CAPSULE | Freq: Every day | ORAL | 0 refills | Status: DC

## 2022-04-11 NOTE — Telephone Encounter (Signed)
The Bactrim was the appropriate antibiotic for her urine culture, and is a very good antibiotic for treating kidney infections.   Certainly if she has high fever or significant burning with urination would recommend urgent care for a repeat urine test to confirm clearance of infection, but the urine tends to always look infected when patients have a stent in.   Would recommend taking oxybutynin 10 mg XL daily and Flomax 0.4 mg daily to help with stent discomfort.   Can repeat a urinalysis and culture Friday or early next week with [place] to confirm clearance of prior infection   Legrand Rams, MD  04/11/2022    Spoke with Courtney Cochran, I will send in RX of Flomax to [facility] on [address]. Patient aware and verbalized understanding. Denies any burning with urination or fevers at this time.   Monia Pouch, CMA 04/11/2022 12:22pm

## 2022-04-11 NOTE — Telephone Encounter (Signed)
Spoke with pt. Pt. Advised that she has been having increased discomfort over last 72 hours, she thinks that this may be related to the stent but has also been noticing cloudy urine worse in the last few days. States that she is currently out of town for work in [place]. She will finish her current antibiotic (Bactrim) tonight. She would like to know what you recommend, whether that's going to an Urgent Care there and having a culture done, or if she should continue Abx until her surgery next Friday. Please advise.

## 2022-04-11 NOTE — Telephone Encounter (Signed)
Patient left a voice mail message on the triage line this morning.  She is scheduled for surgery with Dr. Richardo Hanks on 9/22.    She finishes her antibiotics today, but is still having symptoms of infection.   She is looking for advice.  Please call her.

## 2022-04-12 ENCOUNTER — Other Ambulatory Visit: Payer: BC Managed Care – PPO

## 2022-04-20 ENCOUNTER — Ambulatory Visit
Admission: RE | Admit: 2022-04-20 | Discharge: 2022-04-20 | Disposition: A | Discharge status: Home / Self Care | Payer: BC Managed Care – PPO | Attending: Urology | Admitting: Urology

## 2022-04-20 ENCOUNTER — Ambulatory Visit: Payer: BC Managed Care – PPO | Admitting: Anesthesiology

## 2022-04-20 ENCOUNTER — Other Ambulatory Visit: Payer: Self-pay

## 2022-04-20 ENCOUNTER — Ambulatory Visit: Payer: BC Managed Care – PPO

## 2022-04-20 ENCOUNTER — Encounter: Payer: Self-pay | Admitting: Urology

## 2022-04-20 ENCOUNTER — Encounter
Admission: RE | Disposition: A | Discharge status: Home / Self Care | Payer: Self-pay | Source: Home / Self Care | Attending: Urology

## 2022-04-20 DIAGNOSIS — Z8616 Personal history of COVID-19: Secondary | ICD-10-CM | POA: Diagnosis not present

## 2022-04-20 DIAGNOSIS — N202 Calculus of kidney with calculus of ureter: Secondary | ICD-10-CM

## 2022-04-20 DIAGNOSIS — K219 Gastro-esophageal reflux disease without esophagitis: Secondary | ICD-10-CM | POA: Insufficient documentation

## 2022-04-20 DIAGNOSIS — Z8639 Personal history of other endocrine, nutritional and metabolic disease: Secondary | ICD-10-CM

## 2022-04-20 DIAGNOSIS — Z87891 Personal history of nicotine dependence: Secondary | ICD-10-CM | POA: Diagnosis not present

## 2022-04-20 DIAGNOSIS — Z01812 Encounter for preprocedural laboratory examination: Secondary | ICD-10-CM

## 2022-04-20 DIAGNOSIS — N201 Calculus of ureter: Secondary | ICD-10-CM

## 2022-04-20 HISTORY — PX: CYSTOSCOPY/URETEROSCOPY/HOLMIUM LASER/STENT PLACEMENT: SHX6546

## 2022-04-20 HISTORY — PX: CYSTOSCOPY W/ RETROGRADES: SHX1426

## 2022-04-20 LAB — POCT I-STAT, CHEM 8
BUN: 20 mg/dL (ref 8–23)
Calcium, Ion: 0.96 mmol/L — ABNORMAL LOW (ref 1.15–1.40)
Chloride: 109 mmol/L (ref 98–111)
Creatinine, Ser: 0.6 mg/dL (ref 0.44–1.00)
Glucose, Bld: 81 mg/dL (ref 70–99)
HCT: 39 % (ref 36.0–46.0)
Hemoglobin: 13.3 g/dL (ref 12.0–15.0)
Potassium: 3.6 mmol/L (ref 3.5–5.1)
Sodium: 142 mmol/L (ref 135–145)
TCO2: 22 mmol/L (ref 22–32)

## 2022-04-20 SURGERY — CYSTOSCOPY/URETEROSCOPY/HOLMIUM LASER/STENT PLACEMENT
Anesthesia: General | Laterality: Left

## 2022-04-20 MED ORDER — CEFAZOLIN SODIUM-DEXTROSE 2-4 GM/100ML-% IV SOLN
INTRAVENOUS | Status: AC
  Filled 2022-04-20: qty 100

## 2022-04-20 MED ORDER — LACTATED RINGERS IV SOLN: INTRAVENOUS | Status: DC

## 2022-04-20 MED ORDER — ACETAMINOPHEN 10 MG/ML IV SOLN
INTRAVENOUS | Status: DC | PRN
  Administered 2022-04-20: 1000 mg via INTRAVENOUS

## 2022-04-20 MED ORDER — HYDROCODONE-ACETAMINOPHEN 5-325 MG PO TABS: 1.0000 | ORAL_TABLET | Freq: Four times a day (QID) | ORAL | 0 refills | Status: AC | PRN

## 2022-04-20 MED ORDER — ACETAMINOPHEN 10 MG/ML IV SOLN
INTRAVENOUS | Status: AC
  Filled 2022-04-20: qty 100

## 2022-04-20 MED ORDER — DEXAMETHASONE SODIUM PHOSPHATE 10 MG/ML IJ SOLN
INTRAMUSCULAR | Status: DC | PRN
  Administered 2022-04-20: 10 mg via INTRAVENOUS

## 2022-04-20 MED ORDER — FAMOTIDINE 20 MG PO TABS: 20.0000 mg | ORAL_TABLET | Freq: Once | ORAL | Status: AC

## 2022-04-20 MED ORDER — FAMOTIDINE 20 MG PO TABS
ORAL_TABLET | ORAL | Status: AC
  Administered 2022-04-20: 20 mg via ORAL
  Filled 2022-04-20: qty 1

## 2022-04-20 MED ORDER — CHLORHEXIDINE GLUCONATE 0.12 % MT SOLN
OROMUCOSAL | Status: AC
  Administered 2022-04-20: 15 mL via OROMUCOSAL
  Filled 2022-04-20: qty 15

## 2022-04-20 MED ORDER — IOHEXOL 180 MG/ML  SOLN
INTRAMUSCULAR | Status: DC | PRN
  Administered 2022-04-20: 5 mL

## 2022-04-20 MED ORDER — ONDANSETRON HCL 4 MG/2ML IJ SOLN
INTRAMUSCULAR | Status: DC | PRN
  Administered 2022-04-20 (×2): 4 mg via INTRAVENOUS

## 2022-04-20 MED ORDER — ORAL CARE MOUTH RINSE: 15.0000 mL | Freq: Once | OROMUCOSAL | Status: AC

## 2022-04-20 MED ORDER — LIDOCAINE HCL (CARDIAC) PF 100 MG/5ML IV SOSY
PREFILLED_SYRINGE | INTRAVENOUS | Status: DC | PRN
  Administered 2022-04-20: 100 mg via INTRAVENOUS

## 2022-04-20 MED ORDER — CEPHALEXIN 250 MG PO CAPS: 250.0000 mg | ORAL_CAPSULE | Freq: Every day | ORAL | 0 refills | Status: DC

## 2022-04-20 MED ORDER — KETOROLAC TROMETHAMINE 30 MG/ML IJ SOLN
INTRAMUSCULAR | Status: DC | PRN
  Administered 2022-04-20: 30 mg via INTRAVENOUS

## 2022-04-20 MED ORDER — FENTANYL CITRATE (PF) 100 MCG/2ML IJ SOLN
INTRAMUSCULAR | Status: AC
  Filled 2022-04-20: qty 2

## 2022-04-20 MED ORDER — CEFAZOLIN SODIUM-DEXTROSE 2-4 GM/100ML-% IV SOLN
2.0000 g | INTRAVENOUS | Status: AC
  Administered 2022-04-20: 2 g via INTRAVENOUS

## 2022-04-20 MED ORDER — SUGAMMADEX SODIUM 200 MG/2ML IV SOLN
INTRAVENOUS | Status: DC | PRN
  Administered 2022-04-20: 300 mg via INTRAVENOUS

## 2022-04-20 MED ORDER — ROCURONIUM BROMIDE 100 MG/10ML IV SOLN
INTRAVENOUS | Status: DC | PRN
  Administered 2022-04-20: 40 mg via INTRAVENOUS

## 2022-04-20 MED ORDER — MIDAZOLAM HCL 2 MG/2ML IJ SOLN
INTRAMUSCULAR | Status: DC | PRN
  Administered 2022-04-20: 2 mg via INTRAVENOUS

## 2022-04-20 MED ORDER — CHLORHEXIDINE GLUCONATE 0.12 % MT SOLN: 15.0000 mL | Freq: Once | OROMUCOSAL | Status: AC

## 2022-04-20 MED ORDER — PROPOFOL 10 MG/ML IV BOLUS
INTRAVENOUS | Status: DC | PRN
  Administered 2022-04-20: 100 mg via INTRAVENOUS

## 2022-04-20 MED ORDER — GLYCOPYRROLATE 0.2 MG/ML IJ SOLN
INTRAMUSCULAR | Status: DC | PRN
  Administered 2022-04-20: .2 mg via INTRAVENOUS

## 2022-04-20 MED ORDER — FENTANYL CITRATE (PF) 100 MCG/2ML IJ SOLN: 25.0000 ug | INTRAMUSCULAR | Status: DC | PRN

## 2022-04-20 MED ORDER — MIDAZOLAM HCL 2 MG/2ML IJ SOLN
INTRAMUSCULAR | Status: AC
  Filled 2022-04-20: qty 2

## 2022-04-20 MED ORDER — FENTANYL CITRATE (PF) 100 MCG/2ML IJ SOLN
INTRAMUSCULAR | Status: DC | PRN
  Administered 2022-04-20: 50 ug via INTRAVENOUS

## 2022-04-20 MED ORDER — ONDANSETRON HCL 4 MG/2ML IJ SOLN: 4.0000 mg | Freq: Once | INTRAMUSCULAR | Status: DC | PRN

## 2022-04-20 SURGICAL SUPPLY — 26 items
BAG DRAIN SIEMENS DORNER NS (MISCELLANEOUS) ×2 IMPLANT
BAG DRN NS LF (MISCELLANEOUS) ×2
BAG PRESSURE INF REUSE 3000 (BAG) ×2 IMPLANT
BRUSH SCRUB EZ 1% IODOPHOR (MISCELLANEOUS) ×2 IMPLANT
CNTNR SPEC 2.5X3XGRAD LEK (MISCELLANEOUS) ×2
CONT SPEC 4OZ STER OR WHT (MISCELLANEOUS) ×2
CONT SPEC 4OZ STRL OR WHT (MISCELLANEOUS) ×2
CONTAINER SPEC 2.5X3XGRAD LEK (MISCELLANEOUS) IMPLANT
DRAPE UTILITY 15X26 TOWEL STRL (DRAPES) ×2 IMPLANT
FIBER LASER MOSES 200 DFL (Laser) IMPLANT
GLOVE SURG UNDER POLY LF SZ7.5 (GLOVE) ×2 IMPLANT
GOWN STRL REUS W/ TWL LRG LVL3 (GOWN DISPOSABLE) ×2 IMPLANT
GOWN STRL REUS W/ TWL XL LVL3 (GOWN DISPOSABLE) ×2 IMPLANT
GOWN STRL REUS W/TWL LRG LVL3 (GOWN DISPOSABLE) ×2
GOWN STRL REUS W/TWL XL LVL3 (GOWN DISPOSABLE) ×2
GUIDEWIRE STR DUAL SENSOR (WIRE) ×2 IMPLANT
IV NS IRRIG 3000ML ARTHROMATIC (IV SOLUTION) ×2 IMPLANT
KIT TURNOVER CYSTO (KITS) ×2 IMPLANT
PACK CYSTO AR (MISCELLANEOUS) ×2 IMPLANT
SET CYSTO W/LG BORE CLAMP LF (SET/KITS/TRAYS/PACK) ×2 IMPLANT
SHEATH NAVIGATOR HD 12/14X36 (SHEATH) IMPLANT
STENT URET 6FRX24 CONTOUR (STENTS) IMPLANT
SURGILUBE 2OZ TUBE FLIPTOP (MISCELLANEOUS) ×2 IMPLANT
SYR 10ML LL (SYRINGE) ×2 IMPLANT
VALVE UROSEAL ADJ ENDO (VALVE) IMPLANT
WATER STERILE IRR 500ML POUR (IV SOLUTION) ×2 IMPLANT

## 2022-04-20 NOTE — Op Note (Addendum)
Date of procedure: 04/20/22  Preoperative diagnosis:  Left distal ureteral stone Left renal stones  Postoperative diagnosis:  Same  Procedure: Cystoscopy Left ureteroscopy, laser lithotripsy of distal ureteral stones Left ureteroscopy, laser lithotripsy of left renal stones Left retrograde pyelogram with intraoperative interpretation, left ureteral stent placement  Surgeon: Nickolas Madrid, MD  Anesthesia: General  Complications: None  Intraoperative findings:  Normal cystoscopy Left distal ureteral stones dusted, proximal stones had been pushed back into the kidney and were dusted, all renal stones dusted, multiple submucosal stones Uncomplicated stent placement  EBL: Minimal  Specimens: Stone for analysis  Drains: Left 6 French by 24 cm ureteral stent  Indication: Courtney Cochran is a 66 y.o. patient with left ureteral stones and infection who was previously stented and presents today for definitive management.  After reviewing the management options for treatment, they elected to proceed with the above surgical procedure(s). We have discussed the potential benefits and risks of the procedure, side effects of the proposed treatment, the likelihood of the patient achieving the goals of the procedure, and any potential problems that might occur during the procedure or recuperation. Informed consent has been obtained.  Description of procedure:  The patient was taken to the operating room and general anesthesia was induced. SCDs were placed for DVT prophylaxis. The patient was placed in the dorsal lithotomy position, prepped and draped in the usual sterile fashion, and preoperative antibiotics were administered. A preoperative time-out was performed.   A 21 French rigid cystoscope was used to intubate the urethra and thorough cystoscopy was performed.  The bladder was grossly normal.  The left ureteral stent was grasped and pulled to the meatus and a sensor wire was advanced  up into the kidney under fluoroscopic vision.  The semirigid short ureteroscope was advanced alongside the wire into 5 mm stones were noted in the distal ureter.  A 200 m laser fiber on settings of 1.0 J and 10 Hz was used to methodically dust the stones, there were irrigated free into the bladder.  The stones were sent for analysis.  A second sensor wire was added through the semirigid ureteroscope.  Under fluoroscopic vision, ureteral access sheath was advanced over the wire up to the proximal ureter.  A digital single-channel flexible ureteroscope was advanced through the sheath and thorough pyeloscopy was performed.  The large proximal stone had been pushed back into the renal pelvis.  The 200 m laser fiber on settings of 0.5 J and 80 Hz was used to methodically dust the stone.  There were numerous small stones in many of the calyces, especially the upper pole, and these were all dusted.  Many stones were submucosal and were also dusted.  There pyeloscopy revealed no other stone fragments.  A retrograde pyelogram was performed from the proximal ureter which showed no extravasation or filling defects.  The sheath was removed and careful pullback ureteroscopy showed no residual stone fragments or injury.  A 6 French by 24 cm ureteral stent was placed fluoroscopically over the wire with an excellent curl in the renal pelvis, as well as in the bladder.  The sheath was used to drain the bladder.  Disposition: Stable to PACU  Plan: Stent removal in clinic in 7 to 14 days Will need repeat 95-KDTO urine metabolic work-up in follow-up, follow-up stone analysis  Nickolas Madrid, MD

## 2022-04-20 NOTE — Anesthesia Procedure Notes (Signed)
Procedure Name: Intubation Date/Time: 04/20/2022 2:00 PM  Performed by: Kelton Pillar, CRNAPre-anesthesia Checklist: Patient identified, Emergency Drugs available, Suction available and Patient being monitored Patient Re-evaluated:Patient Re-evaluated prior to induction Oxygen Delivery Method: Circle system utilized Preoxygenation: Pre-oxygenation with 100% oxygen Induction Type: IV induction Ventilation: Mask ventilation without difficulty Laryngoscope Size: McGraph and 3 Grade View: Grade I Tube type: Oral Tube size: 6.5 mm Number of attempts: 1 Airway Equipment and Method: Stylet and Oral airway Placement Confirmation: ETT inserted through vocal cords under direct vision, positive ETCO2, breath sounds checked- equal and bilateral and CO2 detector Secured at: 21 cm Tube secured with: Tape Dental Injury: Teeth and Oropharynx as per pre-operative assessment

## 2022-04-20 NOTE — Discharge Instructions (Signed)
AMBULATORY SURGERY  ?DISCHARGE INSTRUCTIONS ? ? ?The drugs that you were given will stay in your system until tomorrow so for the next 24 hours you should not: ? ?Drive an automobile ?Make any legal decisions ?Drink any alcoholic beverage ? ? ?You may resume regular meals tomorrow.  Today it is better to start with liquids and gradually work up to solid foods. ? ?You may eat anything you prefer, but it is better to start with liquids, then soup and crackers, and gradually work up to solid foods. ? ? ?Please notify your doctor immediately if you have any unusual bleeding, trouble breathing, redness and pain at the surgery site, drainage, fever, or pain not relieved by medication. ? ? ? ?Additional Instructions: ? ? ? ?Please contact your physician with any problems or Same Day Surgery at [phone], Monday through Friday 6 am to 4 pm, or [place] [facility] [place] Main number at [phone].  ?

## 2022-04-20 NOTE — H&P (Signed)
04/20/22 1:46 PM   Trula Slade 1956/03/13 [id number]  CC: Left ureteral stones  HPI: 66 yo F with recurrent nephrolithiasis, recently stented by Dr Lonna Cobb for left ureteral stones and UTI. Here today for definitive management.   PMH: Past Medical History:  Diagnosis Date   Anemia    H/O 67 YEARS AGO   COVID-19 09/11/2019   remdesivir infusions   Diverticulosis    Family history of adverse reaction to anesthesia    PTS SISTER HAS WOKEN UP TWICE DURING SURGERY   Foot injury    LEFT-FOOT BRUISED AND SWOLLEN PER PT(RELATED TO HER FALL THAT BROKE HER WRIST)   GERD (gastroesophageal reflux disease)    RARE   Herpes simplex virus infection    recurrent   History of kidney stones 08/2019   Hyperlipidemia    Left ureteral stone 02/2022   Multiple lung nodules on CT    stable since 2017   Osteopenia    Pneumonia due to COVID-19 virus 08/2019   Sepsis [facility])     Surgical History: Past Surgical History:  Procedure Laterality Date   COLONOSCOPY  01/26/1997   COLONOSCOPY  05/26/2012   COLONOSCOPY  06/25/2017   CYSTOSCOPY W/ RETROGRADES Left 10/02/2019   Procedure: CYSTOSCOPY WITH RETROGRADE PYELOGRAM;  Surgeon: Sondra Come, MD;  Location: [facility] OR[facility];  Service: Urology;  Laterality: Left;   CYSTOSCOPY W/ URETERAL STENT PLACEMENT Left 03/28/2022   Procedure: CYSTOSCOPY WITH STENT REPLACEMENT;  Surgeon: Riki Altes, MD;  Location: [facility];  Service: Urology;  Laterality: Left;   CYSTOSCOPY WITH STENT PLACEMENT Left 09/11/2019   Procedure: CYSTOSCOPY WITH STENT PLACEMENT;  Surgeon: Sondra Come, MD;  Location: [facility];  Service: Urology;  Laterality: Left;   CYSTOSCOPY/URETEROSCOPY/HOLMIUM LASER/STENT PLACEMENT Left 10/02/2019   Procedure: CYSTOSCOPY/URETEROSCOPY/HOLMIUM LASER/STENT EXCHANGE;  Surgeon: Sondra Come, MD;  Location: [facility];  Service: Urology;  Laterality: Left;   DILATION AND CURETTAGE OF UTERUS     ESOPHAGOGASTRODUODENOSCOPY      1998, 2010   KIDNEY STONE SURGERY     OPEN REDUCTION INTERNAL FIXATION (ORIF) DISTAL RADIAL FRACTURE Right 02/22/2016   Procedure: OPEN REDUCTION INTERNAL FIXATION (ORIF) DISTAL RADIAL FRACTURE;  Surgeon: Christena Flake, MD;  Location: [facility];  Service: Orthopedics;  Laterality: Right;   TUBAL LIGATION        Family History: Family History  Problem Relation Age of Onset   CAD Mother    CAD Father    Cancer Brother    Breast cancer Neg Hx     Social History:  reports that she quit smoking about 46 years ago. Her smoking use included cigarettes. She has a 4.00 pack-year smoking history. She has never used smokeless tobacco. She reports current alcohol use. She reports that she does not use drugs.  Physical Exam: BP 135/60   Pulse (!) 52   Temp 97.8 F (36.6 C) (Temporal)   Resp 18   Ht 5\' 8"  (1.727 m)   Wt 68 kg   SpO2 99%   BMI 22.79 kg/m    Constitutional:  Alert and oriented, No acute distress. Cardiovascular: RRR Respiratory: CTA bilaterally GI: Abdomen is soft, nontender, nondistended, no abdominal masses   Laboratory Data: Culture with E Coli, treated with appropriate bactrim  Assessment & Plan:   We specifically discussed the risks ureteroscopy including bleeding, infection/sepsis, stent related symptoms including flank pain/urgency/frequency/incontinence/dysuria, ureteral injury, inability to access stone, or need for staged or additional procedures.  Left URS/LL/Stent  Nickolas Madrid, MD 04/20/2022  [facility] [address] [place], [place] [address] [phone]

## 2022-04-20 NOTE — Anesthesia Preprocedure Evaluation (Addendum)
Anesthesia Evaluation  Patient identified by MRN, date of birth, ID band Patient awake    Reviewed: Allergy & Precautions, NPO status , Patient's Chart, lab work & pertinent test results  Airway Mallampati: II  TM Distance: >3 FB Neck ROM: full    Dental  (+) Teeth Intact   Pulmonary neg pulmonary ROS, Patient abstained from smoking., former smoker,    Pulmonary exam normal breath sounds clear to auscultation       Cardiovascular Exercise Tolerance: Good negative cardio ROS Normal cardiovascular exam Rhythm:Regular     Neuro/Psych negative neurological ROS  negative psych ROS   GI/Hepatic negative GI ROS, Neg liver ROS, GERD  Medicated,  Endo/Other  negative endocrine ROS  Renal/GU negative Renal ROS     Musculoskeletal  (+) Arthritis ,   Abdominal Normal abdominal exam  (+)   Peds negative pediatric ROS (+)  Hematology negative hematology ROS (+) Blood dyscrasia, anemia ,   Anesthesia Other Findings Past Medical History: No date: Anemia     Comment:  H/O 94 YEARS AGO 09/11/2019: COVID-19     Comment:  remdesivir infusions No date: Diverticulosis No date: Family history of adverse reaction to anesthesia     Comment:  PTS SISTER HAS WOKEN UP TWICE DURING SURGERY No date: Foot injury     Comment:  LEFT-FOOT BRUISED AND SWOLLEN PER PT(RELATED TO HER FALL              THAT BROKE HER WRIST) No date: GERD (gastroesophageal reflux disease)     Comment:  RARE No date: Herpes simplex virus infection     Comment:  recurrent 08/2019: History of kidney stones No date: Hyperlipidemia 02/2022: Left ureteral stone No date: Multiple lung nodules on CT     Comment:  stable since 2017 No date: Osteopenia 08/2019: Pneumonia due to COVID-19 virus No date: Sepsis [facility])  Past Surgical History: 01/26/1997: COLONOSCOPY 05/26/2012: COLONOSCOPY 06/25/2017: COLONOSCOPY 10/02/2019: CYSTOSCOPY W/ RETROGRADES; Left      Comment:  Procedure: CYSTOSCOPY WITH RETROGRADE PYELOGRAM;                Surgeon: Sondra Come, MD;  Location: [facility];                Service: Urology;  Laterality: Left; 03/28/2022: CYSTOSCOPY W/ URETERAL STENT PLACEMENT; Left     Comment:  Procedure: CYSTOSCOPY WITH STENT REPLACEMENT;  Surgeon:               Riki Altes, MD;  Location: [facility];  Service:               Urology;  Laterality: Left; 09/11/2019: CYSTOSCOPY WITH STENT PLACEMENT; Left     Comment:  Procedure: CYSTOSCOPY WITH STENT PLACEMENT;  Surgeon:               Sondra Come, MD;  Location: [facility];  Service:               Urology;  Laterality: Left; 10/02/2019: CYSTOSCOPY/URETEROSCOPY/HOLMIUM LASER/STENT PLACEMENT;  Left     Comment:  Procedure: CYSTOSCOPY/URETEROSCOPY/HOLMIUM LASER/STENT               EXCHANGE;  Surgeon: Sondra Come, MD;  Location: [facility]              ORS;  Service: Urology;  Laterality: Left; No date: DILATION AND CURETTAGE OF UTERUS No date: ESOPHAGOGASTRODUODENOSCOPY     Comment:  1998, 2010 No date: KIDNEY STONE SURGERY 02/22/2016: OPEN REDUCTION  INTERNAL FIXATION (ORIF) DISTAL RADIAL  FRACTURE; Right     Comment:  Procedure: OPEN REDUCTION INTERNAL FIXATION (ORIF)               DISTAL RADIAL FRACTURE;  Surgeon: Corky Mull, MD;                Location: [address];  Service: Orthopedics;                Laterality: Right; No date: TUBAL LIGATION  BMI    Body Mass Index: 22.79 kg/m      Reproductive/Obstetrics negative OB ROS                            Anesthesia Physical Anesthesia Plan  ASA: 2  Anesthesia Plan: General   Post-op Pain Management:    Induction: Intravenous  PONV Risk Score and Plan: 1 and Ondansetron and Dexamethasone  Airway Management Planned: Oral ETT  Additional Equipment:   Intra-op Plan:   Post-operative Plan: Extubation in OR  Informed Consent: I have reviewed the patients History and Physical,  chart, labs and discussed the procedure including the risks, benefits and alternatives for the proposed anesthesia with the patient or authorized representative who has indicated his/her understanding and acceptance.     Dental Advisory Given  Plan Discussed with: CRNA and Surgeon  Anesthesia Plan Comments:         Anesthesia Quick Evaluation

## 2022-04-20 NOTE — Transfer of Care (Signed)
Immediate Anesthesia Transfer of Care Note  Patient: Courtney Cochran  Procedure(s) Performed: CYSTOSCOPY/URETEROSCOPY/HOLMIUM LASER/STENT EXCHANGE (Left) CYSTOSCOPY WITH RETROGRADE PYELOGRAM  Patient Location: PACU  Anesthesia Type:General  Level of Consciousness: awake, drowsy and patient cooperative  Airway & Oxygen Therapy: Patient Spontanous Breathing and Patient connected to face mask oxygen  Post-op Assessment: Report given to RN and Post -op Vital signs reviewed and stable  Post vital signs: Reviewed and stable  Last Vitals:  Vitals Value Taken Time  BP 133/79 04/20/22 1505  Temp 36.3 C 04/20/22 1505  Pulse 79 04/20/22 1506  Resp 21 04/20/22 1506  SpO2 99 % 04/20/22 1506  Vitals shown include unvalidated device data.  Last Pain:  Vitals:   04/20/22 1207  TempSrc: Temporal  PainSc: 0-No pain      Patients Stated Pain Goal: 0 (16/10/96 0454)  Complications: No notable events documented.

## 2022-04-23 ENCOUNTER — Encounter: Payer: Self-pay | Admitting: Urology

## 2022-04-27 LAB — CALCULI, WITH PHOTOGRAPH (CLINICAL LAB)
Calcium Oxalate Monohydrate: 30 %
Hydroxyapatite: 70 %
Weight Calculi: 17 mg

## 2022-04-30 NOTE — Anesthesia Postprocedure Evaluation (Signed)
Anesthesia Post Note  Patient: Courtney Cochran  Procedure(s) Performed: CYSTOSCOPY/URETEROSCOPY/HOLMIUM LASER/STENT EXCHANGE (Left) CYSTOSCOPY WITH RETROGRADE PYELOGRAM  Patient location during evaluation: PACU Anesthesia Type: General Level of consciousness: awake and oriented Pain management: pain level controlled Vital Signs Assessment: post-procedure vital signs reviewed and stable Respiratory status: spontaneous breathing and respiratory function stable Cardiovascular status: stable Anesthetic complications: no   No notable events documented.   Last Vitals:  Vitals:   04/20/22 1530 04/20/22 1545  BP: (!) 146/84 (!) 156/89  Pulse: 71 76  Resp: (!) 21   Temp: (!) 36.3 C (!) 36.1 C  SpO2: 100% 98%    Last Pain:  Vitals:   04/23/22 0809  TempSrc:   PainSc: 2                  VAN STAVEREN,Honestie Kulik

## 2022-05-03 ENCOUNTER — Encounter: Payer: Self-pay | Admitting: Urology

## 2022-05-03 ENCOUNTER — Ambulatory Visit (INDEPENDENT_AMBULATORY_CARE_PROVIDER_SITE_OTHER): Payer: BC Managed Care – PPO | Admitting: Urology

## 2022-05-03 VITALS — BP 126/89 | HR 85 | Ht 68.0 in | Wt 150.0 lb

## 2022-05-03 DIAGNOSIS — N201 Calculus of ureter: Secondary | ICD-10-CM

## 2022-05-03 DIAGNOSIS — Z466 Encounter for fitting and adjustment of urinary device: Secondary | ICD-10-CM | POA: Diagnosis not present

## 2022-05-03 DIAGNOSIS — N200 Calculus of kidney: Secondary | ICD-10-CM

## 2022-05-03 MED ORDER — INDAPAMIDE 2.5 MG PO TABS: 2.5000 mg | ORAL_TABLET | Freq: Every day | ORAL | 11 refills | Status: DC

## 2022-05-03 MED ORDER — CEPHALEXIN 250 MG PO CAPS
500.0000 mg | ORAL_CAPSULE | Freq: Once | ORAL | Status: AC
  Administered 2022-05-03: 500 mg via ORAL

## 2022-05-03 NOTE — Patient Instructions (Signed)

## 2022-05-03 NOTE — Progress Notes (Signed)
Cystoscopy Procedure Note:  Indication: Stent removal s/p 04/20/2022 left ureteroscopy for significant ureteral and renal stone burden, stent placement  After informed consent and discussion of the procedure and its risks, Courtney Cochran was positioned and prepped in the standard fashion. Cystoscopy was performed with a flexible cystoscope. The stent was grasped with flexible graspers and removed in its entirety. The patient tolerated the procedure well.  Findings: Uncomplicated stent removal  --------------------------------------------------------------------------  Assessment and Plan: Stone analysis shows 70% calcium phosphate, 30% calcium oxalate.  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  Previously had recommended indapamide and she deferred.  With her recurrent stone she is interested in trying indapamide at this time.  Risk and benefits were discussed.  Trial of indapamide 2.5 mg daily RTC 6 months KUB and 24 hr urine prior   Billey Co, MD 05/03/2022

## 2022-05-04 ENCOUNTER — Encounter: Payer: Self-pay | Admitting: *Deleted

## 2022-05-04 ENCOUNTER — Other Ambulatory Visit: Payer: Self-pay | Admitting: *Deleted

## 2022-05-04 DIAGNOSIS — N200 Calculus of kidney: Secondary | ICD-10-CM

## 2022-06-19 ENCOUNTER — Other Ambulatory Visit: Payer: Self-pay | Admitting: Urology

## 2022-06-27 ENCOUNTER — Ambulatory Visit: Payer: PRIVATE HEALTH INSURANCE | Admitting: Urology

## 2022-06-27 LAB — LITHOLINK 24HR URINE PANEL
Ammonium, Urine: 31 mmol/24 hr (ref 15–60)
Calcium Oxalate Saturation: 3.69 — ABNORMAL LOW (ref 6.00–10.00)
Calcium Phosphate Saturation: 0.86 (ref 0.50–2.00)
Calcium, Urine: 56 mg/24 hr (ref <200)
Calcium/Creatinine Ratio: 47 mg/g creat — ABNORMAL LOW (ref 51–262)
Calcium/Kg Body Weight: 0.8 mg/24 hr/kg (ref <4.0)
Chloride, Urine: 119 mmol/24 hr (ref 70–250)
Citrate, Urine: 337 mg/24 hr — ABNORMAL LOW (ref >550)
Creatinine, Urine: 1198 mg/24 hr
Creatinine/Kg Body Weight: 17.6 mg/24 hr/kg (ref 8.7–20.3)
Magnesium, Urine: 57 mg/24 hr (ref 30–120)
Oxalate, Urine: 34 mg/24 hr (ref 20–40)
Phosphorus, Urine: 687 mg/24 hr (ref 600–1200)
Potassium, Urine: 58 mmol/24 hr (ref 20–100)
Protein Catabolic Rate: 1.1 g/kg/24 hr (ref 0.8–1.4)
Sodium, Urine: 121 mmol/24 hr (ref 50–150)
Sulfate, Urine: 31 meq/24 hr (ref 20–80)
Urea Nitrogen, Urine: 9.68 g/24 hr (ref 6.00–14.00)
Uric Acid Saturation: 0.37 (ref <1.00)
Uric Acid, Urine: 831 mg/24 hr — ABNORMAL HIGH (ref <750)
Urine Volume (Preserved): 1240 mL/24 hr (ref 500–4000)
pH, 24 hr, Urine: 6.623 — ABNORMAL HIGH (ref 5.800–6.200)

## 2022-07-20 ENCOUNTER — Encounter: Payer: Self-pay | Admitting: Internal Medicine

## 2022-07-25 ENCOUNTER — Encounter
Admission: RE | Disposition: A | Discharge status: Home / Self Care | Payer: Self-pay | Source: Home / Self Care | Attending: Internal Medicine

## 2022-07-25 ENCOUNTER — Ambulatory Visit
Admission: RE | Admit: 2022-07-25 | Discharge: 2022-07-25 | Disposition: A | Discharge status: Home / Self Care | Payer: BC Managed Care – PPO | Attending: Internal Medicine | Admitting: Internal Medicine

## 2022-07-25 ENCOUNTER — Ambulatory Visit: Payer: BC Managed Care – PPO | Admitting: General Practice

## 2022-07-25 DIAGNOSIS — Z1211 Encounter for screening for malignant neoplasm of colon: Secondary | ICD-10-CM | POA: Diagnosis not present

## 2022-07-25 DIAGNOSIS — K2970 Gastritis, unspecified, without bleeding: Secondary | ICD-10-CM | POA: Insufficient documentation

## 2022-07-25 DIAGNOSIS — K449 Diaphragmatic hernia without obstruction or gangrene: Secondary | ICD-10-CM | POA: Diagnosis not present

## 2022-07-25 DIAGNOSIS — Z86010 Personal history of colonic polyps: Secondary | ICD-10-CM | POA: Insufficient documentation

## 2022-07-25 DIAGNOSIS — K3189 Other diseases of stomach and duodenum: Secondary | ICD-10-CM | POA: Insufficient documentation

## 2022-07-25 DIAGNOSIS — K5730 Diverticulosis of large intestine without perforation or abscess without bleeding: Secondary | ICD-10-CM | POA: Insufficient documentation

## 2022-07-25 DIAGNOSIS — K640 First degree hemorrhoids: Secondary | ICD-10-CM | POA: Diagnosis not present

## 2022-07-25 DIAGNOSIS — K2100 Gastro-esophageal reflux disease with esophagitis, without bleeding: Secondary | ICD-10-CM | POA: Diagnosis not present

## 2022-07-25 HISTORY — PX: ESOPHAGOGASTRODUODENOSCOPY: SHX5428

## 2022-07-25 HISTORY — PX: COLONOSCOPY WITH PROPOFOL: SHX5780

## 2022-07-25 SURGERY — EGD (ESOPHAGOGASTRODUODENOSCOPY)
Anesthesia: General

## 2022-07-25 SURGERY — COLONOSCOPY WITH PROPOFOL
Anesthesia: General

## 2022-07-25 MED ORDER — STERILE WATER FOR IRRIGATION IR SOLN
Status: DC | PRN
  Administered 2022-07-25: 60 mL

## 2022-07-25 MED ORDER — PROPOFOL 10 MG/ML IV BOLUS
INTRAVENOUS | Status: DC | PRN
  Administered 2022-07-25: 90 mg via INTRAVENOUS

## 2022-07-25 MED ORDER — PROPOFOL 500 MG/50ML IV EMUL
INTRAVENOUS | Status: DC | PRN
  Administered 2022-07-25: 201.729 ug/kg/min via INTRAVENOUS

## 2022-07-25 MED ORDER — LIDOCAINE HCL (CARDIAC) PF 100 MG/5ML IV SOSY
PREFILLED_SYRINGE | INTRAVENOUS | Status: DC | PRN
  Administered 2022-07-25: 100 mg via INTRAVENOUS

## 2022-07-25 MED ORDER — SODIUM CHLORIDE 0.9 % IV SOLN
INTRAVENOUS | Status: DC
  Administered 2022-07-25: 20 mL/h via INTRAVENOUS

## 2022-07-25 NOTE — Anesthesia Preprocedure Evaluation (Addendum)
Anesthesia Evaluation  Patient identified by MRN, date of birth, ID band Patient awake    Reviewed: Allergy & Precautions, NPO status , Patient's Chart, lab work & pertinent test results  Airway Mallampati: II  TM Distance: >3 FB Neck ROM: full    Dental  (+) Teeth Intact, Dental Advidsory Given   Pulmonary neg pulmonary ROS, neg shortness of breath, neg COPD, Patient abstained from smoking., former smoker   Pulmonary exam normal breath sounds clear to auscultation       Cardiovascular Exercise Tolerance: Good (-) angina (-) Past MI and (-) CABG negative cardio ROS Normal cardiovascular exam Rhythm:Regular     Neuro/Psych negative neurological ROS  negative psych ROS   GI/Hepatic negative GI ROS, Neg liver ROS,GERD  Medicated,,  Endo/Other  negative endocrine ROS    Renal/GU negative Renal ROS  negative genitourinary   Musculoskeletal  (+) Arthritis ,    Abdominal Normal abdominal exam  (+)   Peds negative pediatric ROS (+)  Hematology negative hematology ROS (+) Blood dyscrasia, anemia   Anesthesia Other Findings Past Medical History: No date: Anemia     Comment:  H/O 40 YEARS AGO 09/11/2019: COVID-19     Comment:  remdesivir infusions No date: Diverticulosis No date: Family history of adverse reaction to anesthesia     Comment:  PTS SISTER HAS WOKEN UP TWICE DURING SURGERY No date: Foot injury     Comment:  LEFT-FOOT BRUISED AND SWOLLEN PER PT(RELATED TO HER FALL              THAT BROKE HER WRIST) No date: GERD (gastroesophageal reflux disease)     Comment:  RARE No date: Herpes simplex virus infection     Comment:  recurrent 08/2019: History of kidney stones No date: Hyperlipidemia 02/2022: Left ureteral stone No date: Multiple lung nodules on CT     Comment:  stable since 2017 No date: Osteopenia 08/2019: Pneumonia due to COVID-19 virus No date: Sepsis [facility])  Past Surgical History: 01/26/1997:  COLONOSCOPY 05/26/2012: COLONOSCOPY 06/25/2017: COLONOSCOPY 10/02/2019: CYSTOSCOPY W/ RETROGRADES; Left     Comment:  Procedure: CYSTOSCOPY WITH RETROGRADE PYELOGRAM;                Surgeon: Sondra Come, MD;  Location: [facility];                Service: Urology;  Laterality: Left; 03/28/2022: CYSTOSCOPY W/ URETERAL STENT PLACEMENT; Left     Comment:  Procedure: CYSTOSCOPY WITH STENT REPLACEMENT;  Surgeon:               Riki Altes, MD;  Location: [facility];  Service:               Urology;  Laterality: Left; 09/11/2019: CYSTOSCOPY WITH STENT PLACEMENT; Left     Comment:  Procedure: CYSTOSCOPY WITH STENT PLACEMENT;  Surgeon:               Sondra Come, MD;  Location: [facility];  Service:               Urology;  Laterality: Left; 10/02/2019: CYSTOSCOPY/URETEROSCOPY/HOLMIUM LASER/STENT PLACEMENT;  Left     Comment:  Procedure: CYSTOSCOPY/URETEROSCOPY/HOLMIUM LASER/STENT               EXCHANGE;  Surgeon: Sondra Come, MD;  Location: [facility]              ORS;  Service: Urology;  Laterality: Left; No date: DILATION AND CURETTAGE OF UTERUS No  date: ESOPHAGOGASTRODUODENOSCOPY     Comment:  1998, 2010 No date: KIDNEY STONE SURGERY 02/22/2016: OPEN REDUCTION INTERNAL FIXATION (ORIF) DISTAL RADIAL  FRACTURE; Right     Comment:  Procedure: OPEN REDUCTION INTERNAL FIXATION (ORIF)               DISTAL RADIAL FRACTURE;  Surgeon: Christena Flake, MD;                Location: [facility];  Service: Orthopedics;                Laterality: Right; No date: TUBAL LIGATION  BMI    Body Mass Index: 22.79 kg/m      Reproductive/Obstetrics negative OB ROS                             Anesthesia Physical Anesthesia Plan  ASA: 2  Anesthesia Plan: General   Post-op Pain Management: Minimal or no pain anticipated   Induction: Intravenous  PONV Risk Score and Plan: 1 and Ondansetron, Propofol infusion and TIVA  Airway Management Planned: Nasal Cannula  and Natural Airway  Additional Equipment: None  Intra-op Plan:   Post-operative Plan: Extubation in OR  Informed Consent: I have reviewed the patients History and Physical, chart, labs and discussed the procedure including the risks, benefits and alternatives for the proposed anesthesia with the patient or authorized representative who has indicated his/her understanding and acceptance.     Dental Advisory Given  Plan Discussed with: CRNA and Surgeon  Anesthesia Plan Comments: (Discussed risks of anesthesia with patient, including possibility of difficulty with spontaneous ventilation under anesthesia necessitating airway intervention, PONV, and rare risks such as cardiac or respiratory or neurological events, and allergic reactions. Discussed the role of CRNA in patient's perioperative care. Patient understands.)        Anesthesia Quick Evaluation

## 2022-07-25 NOTE — Anesthesia Postprocedure Evaluation (Signed)
Anesthesia Post Note  Patient: Courtney Cochran  Procedure(s) Performed: COLONOSCOPY WITH PROPOFOL ESOPHAGOGASTRODUODENOSCOPY (EGD)  Patient location during evaluation: Endoscopy Anesthesia Type: General Level of consciousness: awake and alert Pain management: pain level controlled Vital Signs Assessment: post-procedure vital signs reviewed and stable Respiratory status: spontaneous breathing, nonlabored ventilation, respiratory function stable and patient connected to nasal cannula oxygen Cardiovascular status: blood pressure returned to baseline and stable Postop Assessment: no apparent nausea or vomiting Anesthetic complications: no  No notable events documented.   Last Vitals:  Vitals:   07/25/22 1131 07/25/22 1141  BP: 110/79 125/64  Pulse:    Resp:  20  Temp: 36.6 C   SpO2:      Last Pain:  Vitals:   07/25/22 1141  TempSrc:   PainSc: 0-No pain                 Stephanie Coup

## 2022-07-25 NOTE — Op Note (Signed)
[facility] Gastroenterology Patient Name: Courtney Cochran Procedure Date: 07/25/2022 10:54 AM MRN: [id number] Account #: [id number] Date of Birth: 02-22-1956 Admit Type: Outpatient Age: 66 Room: [facility] ENDO ROOM 2 Gender: Female Note Status: Finalized Instrument Name: Prentice Docker [id number] Procedure:             Colonoscopy Indications:           High risk colon cancer surveillance: Personal history                         of non-advanced adenoma Providers:             Boykin Nearing. Norma Fredrickson MD, MD Referring MD:          Boykin Nearing. Norma Fredrickson MD, MD (Referring MD), Daniel Nones,                         MD (Referring MD) Medicines:             Propofol per Anesthesia Complications:         No immediate complications. Procedure:             Pre-Anesthesia Assessment:                        - The risks and benefits of the procedure and the                         sedation options and risks were discussed with the                         patient. All questions were answered and informed                         consent was obtained.                        - Patient identification and proposed procedure were                         verified prior to the procedure by the nurse. The                         procedure was verified in the procedure room.                        - ASA Grade Assessment: III - A patient with severe                         systemic disease.                        - After reviewing the risks and benefits, the patient                         was deemed in satisfactory condition to undergo the                         procedure.                        After obtaining informed  consent, the colonoscope was                         passed under direct vision. Throughout the procedure,                         the patient's blood pressure, pulse, and oxygen                         saturations were monitored continuously. The                         Colonoscope  was introduced through the anus and                         advanced to the the cecum, identified by appendiceal                         orifice and ileocecal valve. The colonoscopy was                         performed without difficulty. The patient tolerated                         the procedure well. The quality of the bowel                         preparation was good. The ileocecal valve, appendiceal                         orifice, and rectum were photographed. Findings:      The perianal and digital rectal examinations were normal. Pertinent       negatives include normal sphincter tone and no palpable rectal lesions.      Non-bleeding internal hemorrhoids were found during retroflexion. The       hemorrhoids were Grade I (internal hemorrhoids that do not prolapse).      Multiple medium-mouthed and small-mouthed diverticula were found in the       left colon.      The exam was otherwise without abnormality. Impression:            - Non-bleeding internal hemorrhoids.                        - Diverticulosis in the left colon.                        - The examination was otherwise normal.                        - No specimens collected. Recommendation:        - Patient has a contact number available for                         emergencies. The signs and symptoms of potential                         delayed complications were discussed with the patient.  Return to normal activities tomorrow. Written                         discharge instructions were provided to the patient.                        - Resume previous diet.                        - Continue present medications.                        - Repeat colonoscopy in 10 years for screening                         purposes.                        - Return to GI office PRN.                        - Await pathology results from EGD, also performed                         today.                        -  Return to my office in 3 months.                        - The findings and recommendations were discussed with                         the patient.                        - Telephone GI office to schedule appointment. Procedure Code(s):     --- Professional ---                        [id number], Colorectal cancer screening; colonoscopy on                         individual at high risk Diagnosis Code(s):     --- Professional ---                        K57.30, Diverticulosis of large intestine without                         perforation or abscess without bleeding                        K64.0, First degree hemorrhoids                        Z86.010, Personal history of colonic polyps CPT copyright 2022 American Medical Association. All rights reserved. The codes documented in this report are preliminary and upon coder review may  be revised to meet current compliance requirements. Efrain Sella MD, MD 07/25/2022 11:34:58 AM This report has been signed electronically. Number of Addenda: 0 Note Initiated On: 07/25/2022 10:54 AM Scope Withdrawal Time: 0 hours 5  minutes 0 seconds  Total Procedure Duration: 0 hours 10 minutes 41 seconds  Estimated Blood Loss:  Estimated blood loss: none.      [facility]

## 2022-07-25 NOTE — Transfer of Care (Signed)
Immediate Anesthesia Transfer of Care Note  Patient: Trula Slade  Procedure(s) Performed: COLONOSCOPY WITH PROPOFOL ESOPHAGOGASTRODUODENOSCOPY (EGD)  Patient Location: Endoscopy Unit  Anesthesia Type:General  Level of Consciousness: drowsy  Airway & Oxygen Therapy: Patient Spontanous Breathing  Post-op Assessment: Report given to RN and Post -op Vital signs reviewed and stable  Post vital signs: Reviewed and stable  Last Vitals:  Vitals Value Taken Time  BP 110/79 07/25/22 1131  Temp    Pulse 76 07/25/22 1132  Resp 21 07/25/22 1132  SpO2 100 % 07/25/22 1132  Vitals shown include unvalidated device data.  Last Pain:  Vitals:   07/25/22 0939  TempSrc: Temporal  PainSc: 0-No pain         Complications: No notable events documented.

## 2022-07-25 NOTE — H&P (Signed)
Outpatient short stay form Pre-procedure 07/25/2022 10:27 AM Courtney Cochran Courtney Cochran, M.D.  Primary Physician: Courtney Cochran, M.D.  Reason for visit:  GERD, abdominal cramping, personal history of adenomatous colon polyps  History of present illness:  [name] presents for GERD. She has noticed an uptick in symptoms over the past several months but appears to be controlled with PPI. She also mentions some postprandial lower abdominal cramping which is equivocally relieved with passing flatus or BM. Not always relieved.  Chronic mild constipation Problem List: Problem List Date Reviewed: 05/22/2022                            Patient presents for colonoscopy for a personal hx of colon polyps. The patient denies abdominal pain, abnormal weight loss or rectal bleeding.       Current Facility-Administered Medications:    0.9 %  sodium chloride infusion, , Intravenous, Continuous, Courtney Cochran, Courtney Pike, MD, Last Rate: 20 mL/hr at 07/25/22 0950, 20 mL/hr at 07/25/22 0950  Medications Prior to Admission  Medication Sig Dispense Refill Last Dose   BIOTIN PO Take 1 tablet by mouth daily.   Past Week   calcium carbonate (TUMS - DOSED IN MG ELEMENTAL CALCIUM) 500 MG chewable tablet Chew 1 tablet by mouth as needed for heartburn.   Past Week   Cholecalciferol 125 MCG (5000 UT) TABS Take 1 tablet by mouth daily.   Past Week   Cranberry 1000 MG CAPS Take 1,000 mg by mouth daily.   Past Week   indapamide (LOZOL) 2.5 MG tablet Take 1 tablet (2.5 mg total) by mouth daily. 30 tablet 11 Past Week   metFORMIN (GLUCOPHAGE) 500 MG tablet Take 1,000 mg by mouth daily with breakfast. Used for weight loss   Past Week   Multiple Vitamin (MULTI-VITAMIN) tablet Take 1 tablet by mouth daily.    Past Week   Omega-3 Fatty Acids (FISH OIL) 1000 MG CAPS Take 1,000 mg by mouth daily.    Past Week   oxybutynin (DITROPAN) 5 MG tablet 1 tab tid prn frequency,urgency, bladder spasm 30 tablet 0 Past Week   tamsulosin  (FLOMAX) 0.4 MG CAPS capsule Take 1 capsule (0.4 mg total) by mouth daily. 30 capsule 0 Past Week     No Known Allergies   Past Medical History:  Diagnosis Date   Anemia    H/O 50 YEARS AGO   COVID-19 09/11/2019   remdesivir infusions   Diverticulosis    Family history of adverse reaction to anesthesia    PTS SISTER HAS WOKEN UP TWICE DURING SURGERY   Foot injury    LEFT-FOOT BRUISED AND SWOLLEN PER PT(RELATED TO HER FALL THAT BROKE HER WRIST)   GERD (gastroesophageal reflux disease)    RARE   Herpes simplex virus infection    recurrent   History of kidney stones 08/2019   Hyperlipidemia    Left ureteral stone 02/2022   Multiple lung nodules on CT    stable since 2017   Osteopenia    Pneumonia due to COVID-19 virus 08/2019   Sepsis ([place])     Review of systems:  Otherwise negative.    Physical Exam  Gen: Alert, oriented. Appears stated age.  HEENT: [place]/AT. PERRLA. Lungs: CTA, no wheezes. CV: RR nl S1, S2. Abd: soft, benign, no masses. BS+ Ext: No edema. Pulses 2+    Planned procedures: Proceed with EGD and colonoscopy. The patient understands the nature of the planned procedure,  indications, risks, alternatives and potential complications including but not limited to bleeding, infection, perforation, damage to internal organs and possible oversedation/side effects from anesthesia. The patient agrees and gives consent to proceed.  Please refer to procedure notes for findings, recommendations and patient disposition/instructions.     Courtney Cochran K. Courtney Cochran, M.D. Gastroenterology 07/25/2022  10:27 AM

## 2022-07-25 NOTE — Op Note (Signed)
[facility] Gastroenterology Patient Name: Courtney Cochran Procedure Date: 07/25/2022 10:55 AM MRN: [id number] Account #: [id number] Date of Birth: 1955/08/27 Admit Type: Outpatient Age: 66 Room: [facility] ENDO ROOM 2 Gender: Female Note Status: Finalized Instrument Name: Upper Endoscope [id number] Procedure:             Upper GI endoscopy Indications:           Lower abdominal pain, Gastro-esophageal reflux disease Providers:             Boykin Nearing. Norma Fredrickson MD, MD Referring MD:          Boykin Nearing. Norma Fredrickson MD, MD (Referring MD), Daniel Nones,                         MD (Referring MD) Medicines:             Propofol per Anesthesia Complications:         No immediate complications. Procedure:             Pre-Anesthesia Assessment:                        - The risks and benefits of the procedure and the                         sedation options and risks were discussed with the                         patient. All questions were answered and informed                         consent was obtained.                        - Patient identification and proposed procedure were                         verified prior to the procedure by the nurse. The                         procedure was verified in the procedure room.                        - ASA Grade Assessment: III - A patient with severe                         systemic disease.                        - After reviewing the risks and benefits, the patient                         was deemed in satisfactory condition to undergo the                         procedure.                        After obtaining informed consent, the endoscope was  passed under direct vision. Throughout the procedure,                         the patient's blood pressure, pulse, and oxygen                         saturations were monitored continuously. The Endoscope                         was introduced through the mouth, and  advanced to the                         third part of duodenum. The upper GI endoscopy was                         accomplished without difficulty. The patient tolerated                         the procedure well. The patient tolerated the                         procedure well. Findings:      The esophagus was normal.      A small hiatal hernia was present.      Patchy mildly erythematous mucosa without bleeding was found in the       gastric body and in the gastric antrum. Biopsies were taken with a cold       forceps for Helicobacter pylori testing.      The examined duodenum was normal.      The exam was otherwise without abnormality. Impression:            - Normal esophagus.                        - Small hiatal hernia.                        - Erythematous mucosa in the gastric body and antrum.                         Biopsied.                        - Normal examined duodenum.                        - The examination was otherwise normal. Recommendation:        - Await pathology results.                        - Proceed with colonoscopy Procedure Code(s):     --- Professional ---                        [id number], Esophagogastroduodenoscopy, flexible,                         transoral; with biopsy, single or multiple Diagnosis Code(s):     --- Professional ---  K21.9, Gastro-esophageal reflux disease without                         esophagitis                        R10.30, Lower abdominal pain, unspecified                        K31.89, Other diseases of stomach and duodenum                        K44.9, Diaphragmatic hernia without obstruction or                         gangrene CPT copyright 2022 American Medical Association. All rights reserved. The codes documented in this report are preliminary and upon coder review may  be revised to meet current compliance requirements. Stanton Kidney MD, MD 07/25/2022 11:14:43 AM This report has been signed  electronically. Number of Addenda: 0 Note Initiated On: 07/25/2022 10:55 AM Estimated Blood Loss:  Estimated blood loss: none. Estimated blood loss: none.      [facility]

## 2022-07-25 NOTE — Interval H&P Note (Signed)
History and Physical Interval Note:  07/25/2022 10:29 AM  Courtney Cochran  has presented today for surgery, with the diagnosis of personal history adenomatous polyp GERD Bilateral Lower Abd Cramping.  The various methods of treatment have been discussed with the patient and family. After consideration of risks, benefits and other options for treatment, the patient has consented to  Procedure(s): COLONOSCOPY WITH PROPOFOL (N/A) ESOPHAGOGASTRODUODENOSCOPY (EGD) (N/A) as a surgical intervention.  The patient's history has been reviewed, patient examined, no change in status, stable for surgery.  I have reviewed the patient's chart and labs.  Questions were answered to the patient's satisfaction.     [place], [place]

## 2022-07-26 ENCOUNTER — Encounter: Payer: Self-pay | Admitting: Internal Medicine

## 2022-07-26 LAB — SURGICAL PATHOLOGY

## 2022-08-16 ENCOUNTER — Encounter: Payer: Self-pay | Admitting: Urology

## 2022-11-08 ENCOUNTER — Ambulatory Visit: Payer: BC Managed Care – PPO | Admitting: Urology

## 2022-11-19 ENCOUNTER — Ambulatory Visit: Payer: BC Managed Care – PPO | Admitting: Urology

## 2022-12-04 ENCOUNTER — Other Ambulatory Visit: Payer: Self-pay

## 2022-12-04 DIAGNOSIS — E7849 Other hyperlipidemia: Secondary | ICD-10-CM

## 2022-12-07 ENCOUNTER — Ambulatory Visit
Admission: RE | Admit: 2022-12-07 | Discharge: 2022-12-07 | Disposition: A | Discharge status: Home / Self Care | Payer: BC Managed Care – PPO | Source: Ambulatory Visit | Attending: Internal Medicine | Admitting: Internal Medicine

## 2022-12-07 DIAGNOSIS — E7849 Other hyperlipidemia: Secondary | ICD-10-CM

## 2022-12-12 ENCOUNTER — Ambulatory Visit: Payer: BC Managed Care – PPO | Admitting: Urology

## 2022-12-12 ENCOUNTER — Other Ambulatory Visit: Payer: Self-pay | Admitting: *Deleted

## 2022-12-12 DIAGNOSIS — N200 Calculus of kidney: Secondary | ICD-10-CM

## 2022-12-13 ENCOUNTER — Ambulatory Visit
Admission: RE | Admit: 2022-12-13 | Discharge: 2022-12-13 | Disposition: A | Discharge status: Home / Self Care | Payer: BC Managed Care – PPO | Source: Ambulatory Visit | Attending: Urology | Admitting: Urology

## 2022-12-13 ENCOUNTER — Encounter: Payer: Self-pay | Admitting: Urology

## 2022-12-13 ENCOUNTER — Ambulatory Visit
Admission: RE | Admit: 2022-12-13 | Discharge: 2022-12-13 | Disposition: A | Discharge status: Home / Self Care | Payer: BC Managed Care – PPO | Attending: Urology | Admitting: Urology

## 2022-12-13 ENCOUNTER — Ambulatory Visit (INDEPENDENT_AMBULATORY_CARE_PROVIDER_SITE_OTHER): Payer: BC Managed Care – PPO | Admitting: Urology

## 2022-12-13 VITALS — BP 129/82 | HR 71 | Ht 67.0 in | Wt 151.0 lb

## 2022-12-13 DIAGNOSIS — N200 Calculus of kidney: Secondary | ICD-10-CM | POA: Diagnosis not present

## 2022-12-13 DIAGNOSIS — Z87442 Personal history of urinary calculi: Secondary | ICD-10-CM | POA: Diagnosis not present

## 2022-12-13 DIAGNOSIS — Z09 Encounter for follow-up examination after completed treatment for conditions other than malignant neoplasm: Secondary | ICD-10-CM | POA: Diagnosis not present

## 2022-12-13 MED ORDER — INDAPAMIDE 2.5 MG PO TABS: 2.5000 mg | ORAL_TABLET | Freq: Every day | ORAL | 11 refills | Status: DC

## 2022-12-13 NOTE — Patient Instructions (Signed)
Continue to drink plenty of fluids to prevent kidney stones.  The goal is 2.5 L of urine output per day, and your urine should be clear.  Would also recommend increasing citrate in the diet, the easiest way to do this is add lemon juice to your water, or use Crystal light lemonade packets.  Continue the indapamide.

## 2022-12-13 NOTE — Progress Notes (Signed)
   12/13/2022 9:23 AM   Courtney Cochran Feb 23, 1956 [id number]  Reason for visit: Follow up nephrolithiasis, review 24-hour urine results  HPI: 67 year old female with recurrent left-sided nephrolithiasis.  Most recent procedure was left ureteroscopy in September 2023, stone analysis at that time showed 70% calcium phosphate, 30% calcium oxalate.  She had a long history of hypercalciuria and had deferred treatment, but was willing to start indapamide at our last visit in October 2023.  She denies any stone episodes, gross hematuria, or UTIs since our last visit.  24-hour urine results were reviewed from November 2023-notable for low urine volume of 1.2 L, improved and normal urine calcium of 56 on the indapamide, normal oxalate of 34, low urine citrate of 337, urine pH 6.6, mildly elevated uric acid of 831.  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  No evidence of ureteral stones.  I recommended continuing the indapamide, increasing fluid intake and adding high citrate beverages to the diet, in addition to standard stone prevention strategies.  Continue indapamide for stone prevention, refilled RTC 6 months KUB prior, if stable consider spacing to yearly follow-up  Sondra Come, MD  [facility] [address], [address] [place], [place] [address] [phone]

## 2023-01-07 ENCOUNTER — Other Ambulatory Visit: Payer: Self-pay | Admitting: Internal Medicine

## 2023-01-07 DIAGNOSIS — Z1231 Encounter for screening mammogram for malignant neoplasm of breast: Secondary | ICD-10-CM

## 2023-02-19 ENCOUNTER — Ambulatory Visit
Admission: RE | Admit: 2023-02-19 | Discharge: 2023-02-19 | Disposition: A | Discharge status: Home / Self Care | Payer: BC Managed Care – PPO | Source: Ambulatory Visit | Attending: Internal Medicine | Admitting: Internal Medicine

## 2023-02-19 DIAGNOSIS — Z1231 Encounter for screening mammogram for malignant neoplasm of breast: Secondary | ICD-10-CM | POA: Diagnosis not present

## 2023-02-21 ENCOUNTER — Other Ambulatory Visit: Payer: Self-pay | Admitting: Internal Medicine

## 2023-02-21 DIAGNOSIS — R928 Other abnormal and inconclusive findings on diagnostic imaging of breast: Secondary | ICD-10-CM

## 2023-02-21 DIAGNOSIS — N6489 Other specified disorders of breast: Secondary | ICD-10-CM

## 2023-02-28 ENCOUNTER — Ambulatory Visit
Admission: RE | Admit: 2023-02-28 | Discharge: 2023-02-28 | Disposition: A | Discharge status: Home / Self Care | Payer: BC Managed Care – PPO | Source: Ambulatory Visit | Attending: Internal Medicine | Admitting: Internal Medicine

## 2023-02-28 DIAGNOSIS — N6489 Other specified disorders of breast: Secondary | ICD-10-CM | POA: Diagnosis present

## 2023-02-28 DIAGNOSIS — R928 Other abnormal and inconclusive findings on diagnostic imaging of breast: Secondary | ICD-10-CM | POA: Insufficient documentation

## 2023-06-19 ENCOUNTER — Ambulatory Visit: Payer: BC Managed Care – PPO | Admitting: Urology

## 2023-08-15 ENCOUNTER — Other Ambulatory Visit: Payer: Self-pay | Admitting: Internal Medicine

## 2023-08-15 DIAGNOSIS — N6489 Other specified disorders of breast: Secondary | ICD-10-CM

## 2023-09-18 ENCOUNTER — Other Ambulatory Visit: Payer: Self-pay

## 2023-09-18 DIAGNOSIS — N200 Calculus of kidney: Secondary | ICD-10-CM

## 2023-09-19 ENCOUNTER — Ambulatory Visit
Admission: RE | Admit: 2023-09-19 | Discharge: 2023-09-19 | Disposition: A | Discharge status: Home / Self Care | Payer: Medicare Other | Attending: Urology | Admitting: Urology

## 2023-09-19 ENCOUNTER — Ambulatory Visit: Payer: Medicare Other | Admitting: Urology

## 2023-09-19 ENCOUNTER — Ambulatory Visit
Admission: RE | Admit: 2023-09-19 | Discharge: 2023-09-19 | Disposition: A | Discharge status: Home / Self Care | Payer: Medicare Other | Source: Ambulatory Visit | Attending: Urology

## 2023-09-19 VITALS — BP 130/78 | HR 75 | Ht 68.0 in | Wt 150.0 lb

## 2023-09-19 DIAGNOSIS — N3941 Urge incontinence: Secondary | ICD-10-CM

## 2023-09-19 DIAGNOSIS — N200 Calculus of kidney: Secondary | ICD-10-CM

## 2023-09-19 MED ORDER — INDAPAMIDE 2.5 MG PO TABS: 2.5000 mg | ORAL_TABLET | ORAL | 6 refills | Status: DC

## 2023-09-19 NOTE — Progress Notes (Signed)
   09/19/2023 10:31 AM   Courtney Cochran February 04, 1956 [id number]  Reason for visit: Follow up nephrolithiasis  HPI: 68 year old female with recurrent left-sided nephrolithiasis.  Most recent procedure was left ureteroscopy in September 2023, stone analysis at that time showed 70% calcium phosphate, 30% calcium oxalate.  Multiple stones at that time appeared to be submucosal based on fluoroscopy.  She had a long history of hypercalciuria and had deferred treatment, but was willing to start indapamide at our visit in October 2023.  She denies any stone episodes, gross hematuria, or UTIs since our last visit.  24-hour urine results from November 2023-notable for low urine volume of 1.2 L, improved and normal urine calcium of 56 on the indapamide, normal oxalate of 34, low urine citrate of 337, urine pH 6.6, mildly elevated uric acid of 831.  I personally viewed and interpreted the KUB today that shows some small left-sided calcifications consistent with medullary nephrocalcinosis, largest stone measures 4 mm in the lower pole.  No evidence of ureteral stones, stable and unchanged pelvic calcifications.  Overall relatively stable from prior KUB  Her PCP requested to change the indapamide to every other day because of a borderline low potassium level.   Can space indapamide to every other day, increasing fluid intake and adding high citrate beverages to the diet, in addition to standard stone prevention strategies.  She also reports some new mild urgency and occasional urge incontinence.  This is not bothersome enough to take a medication at this point.  We discussed avoiding bladder irritants, offered a trial of an OAB medication but she would like to hold off at this time.  Continue indapamide for stone prevention, changed to every other day Okay to trial OAB medication if she desires in the future RTC 1 year KUB  Sondra Come, MD  [facility] [address], [address] [place], [place] [address] [phone]

## 2023-09-19 NOTE — Addendum Note (Signed)
 Addended by: Frankey Shown on: 09/19/2023 10:50 AM   Modules accepted: Orders

## 2023-09-26 ENCOUNTER — Ambulatory Visit
Admission: RE | Admit: 2023-09-26 | Discharge: 2023-09-26 | Disposition: A | Discharge status: Home / Self Care | Payer: Medicare Other | Source: Ambulatory Visit | Attending: Internal Medicine | Admitting: Internal Medicine

## 2023-09-26 DIAGNOSIS — N6489 Other specified disorders of breast: Secondary | ICD-10-CM

## 2023-10-01 ENCOUNTER — Other Ambulatory Visit: Payer: Self-pay | Admitting: Internal Medicine

## 2023-10-01 DIAGNOSIS — R928 Other abnormal and inconclusive findings on diagnostic imaging of breast: Secondary | ICD-10-CM

## 2023-10-01 DIAGNOSIS — N6489 Other specified disorders of breast: Secondary | ICD-10-CM

## 2024-01-20 ENCOUNTER — Other Ambulatory Visit: Payer: Self-pay

## 2024-01-20 DIAGNOSIS — N200 Calculus of kidney: Secondary | ICD-10-CM

## 2024-01-20 MED ORDER — INDAPAMIDE 2.5 MG PO TABS: 2.5000 mg | ORAL_TABLET | ORAL | 5 refills | Status: AC

## 2024-03-25 ENCOUNTER — Encounter: Payer: Self-pay | Admitting: Urology

## 2024-03-26 ENCOUNTER — Other Ambulatory Visit

## 2024-03-26 ENCOUNTER — Inpatient Hospital Stay: Admission: RE | Admit: 2024-03-26 | Source: Ambulatory Visit

## 2024-04-09 ENCOUNTER — Ambulatory Visit
Admission: RE | Admit: 2024-04-09 | Discharge: 2024-04-09 | Disposition: A | Discharge status: Home / Self Care | Source: Ambulatory Visit | Attending: Internal Medicine | Admitting: Internal Medicine

## 2024-04-09 DIAGNOSIS — N6489 Other specified disorders of breast: Secondary | ICD-10-CM | POA: Insufficient documentation

## 2024-04-09 DIAGNOSIS — R928 Other abnormal and inconclusive findings on diagnostic imaging of breast: Secondary | ICD-10-CM | POA: Insufficient documentation

## 2024-04-21 ENCOUNTER — Telehealth: Payer: Self-pay

## 2024-04-21 ENCOUNTER — Other Ambulatory Visit: Payer: Self-pay | Admitting: Urology

## 2024-04-21 MED ORDER — TAMSULOSIN HCL 0.4 MG PO CAPS: 0.4000 mg | ORAL_CAPSULE | Freq: Every day | ORAL | 1 refills | Status: DC

## 2024-04-21 MED ORDER — HYDROCODONE-ACETAMINOPHEN 5-325 MG PO TABS: 1.0000 | ORAL_TABLET | Freq: Four times a day (QID) | ORAL | 0 refills | Status: DC | PRN

## 2024-04-21 NOTE — Telephone Encounter (Signed)
 Pt called in with concerns she might have a left sided stone she is starting to feel. Pt states very mild discomfort but its tolerable. Pt was asking if we could call in pain meds or possibly flomax  for in case it gets worse. Pt was instructed to try tylenol  or ibuprofen for mild pain for now. She is currently out of town in [place]  right now on business. I instructed her if her pain became worse that she may need to seek attention while she is out there in the ER. Pt voiced understanding. If we are able to call something in pt was going to get us  the info for a pharmacy by her there.

## 2024-04-28 NOTE — Progress Notes (Signed)
 History of Present Illness:   Courtney Cochran is a 68 y.o. female here for   Verbally consented to the use of AI for note-taking.   Chief Complaint  Patient presents with  . Pain    Patient complains of pain in her sides that sometime radiates to abdomen. She describes it as a nerve pain.  She also states her head has been hurting some and taste buds have also been off      History of Present Illness Courtney Cochran is a 68 year old female who presents with severe surface-type pain and chills.  She experiences severe surface-type pain, described as 'nerve endings' pain, primarily in the upper body but also occurring in other areas. The pain has been present for a long time but has intensified in the last few days, becoming extremely severe and causing emotional distress. It is not associated with specific movements or pressure and is described as random and very painful when it occurs.  She experiences chills without fever, which she describes as extreme, similar to previous episodes when she had COVID-19. No cough, congestion, or significant nasal drip is present, but she has a slight headache. The chills are severe enough to cause teeth chattering, but she did not feel warm and did not have a thermometer to check her temperature.  She took two Tylenol  Extra Strength and two ibuprofen (Advil) for pain relief, which was effective until the early morning hours. Additional medication was taken during the night due to pain waking her up. She was in dire pain while traveling and required medication before boarding a plane.  She has a history of kidney stones and recently thought she might be developing another stone, but the pain resolved. She was prescribed hydrocortisone  by her urologist, which she did not use, and has not had further issues with kidney stones since then.  She recently had a normal mammogram and reports no lumps in her breasts. She plays tennis without  issues.   Past Medical History:   Past Medical History:  Diagnosis Date  . Anemia   . Hyperlipidemia   . Lung nodules 09/26/2015   <5 mm, bilateral, by CT 5/15.  Stable by CT through 2/17.  No further evaluation recommended.  . Recurrent HSV (herpes simplex virus)   . Renal stones    followed by Urology    Past Surgical History:   Past Surgical History:  Procedure Laterality Date  . COLONOSCOPY N/A 01/26/1997   [facility] - Diverticulosis  . COLONOSCOPY N/A 05/26/2012   (12/31/2008) Dr. FABIENE Holmes @ [facility] - Adenomatous Polyps: CBF 04/2017; Recall 04/12/2017  . Open reduction and internal fixation of right distal radius fracture  Right 02/22/2016   Dr.Poggi   . COLONOSCOPY N/A 06/25/2017   Dr. FABIENE Holmes @ [facility] - Adenomatous Polyps: CBF 11/20211  . Colon @ [facility]  07/25/2022   Diverticulosis/Otherwise normal colon/PHx CP/Repeat 46yrs/TKT  . EGD @ [facility]  07/25/2022   Gastritis/No repeat/TKT  . EGD  12/31/2008, 03/02/1997  . TUBAL LIGATION      Allergies:  No Known Allergies  Current Medications:   Prior to Admission medications  Medication Sig Taking? Last Dose  acetaminophen  (TYLENOL  ORAL) Take by mouth Yes Taking  BIOTIN ORAL Take by mouth Yes Taking  indapamide  (LOZOL ) 2.5 MG tablet Take 1 tablet (2.5 mg total) by mouth once daily Yes Taking  multivitamin tablet Take 1 tablet by mouth once daily. Yes Taking  pantoprazole  (PROTONIX ) 40 MG DR  tablet Take 1 tablet (40 mg total) by mouth once daily Per patient. Yes Taking  UNABLE TO FIND Med Name: CMP car/Tir 15/20 mg/ml  Inject 15 (2.25/3 mg) Yes Taking  gabapentin (NEURONTIN) 100 MG capsule Take 1 capsule (100 mg total) by mouth at bedtime    predniSONE (DELTASONE) 20 MG tablet Take 1 tablet (20 mg total) by mouth once daily for 7 days      Family History:   Family History  Problem Relation Name Age of Onset  . High blood pressure (Hypertension) Mother    . Stroke Mother    . Coronary Artery  Disease (Blocked arteries around heart) Mother    . High blood pressure (Hypertension) Father    . Coronary Artery Disease (Blocked arteries around heart) Father    . High blood pressure (Hypertension) Sister    . High blood pressure (Hypertension) Brother    . Cancer Brother    . Colon cancer Neg Hx    . Colon polyps Neg Hx    . Crohn's disease Neg Hx      Social History:   Social History   Socioeconomic History  . Marital status: Divorced  Tobacco Use  . Smoking status: Former  . Smokeless tobacco: Never  Vaping Use  . Vaping status: Never Used  Substance and Sexual Activity  . Alcohol use: No    Alcohol/week: 0.0 standard drinks of alcohol  . Drug use: No  . Sexual activity: Yes    Birth control/protection: None, Surgical  Social History Narrative   Working full time.   Social Drivers of Health   Financial Resource Strain: Low Risk  (06/11/2023)   Overall Financial Resource Strain (CARDIA)   . Difficulty of Paying Living Expenses: Not hard at all  Food Insecurity: No Food Insecurity (06/11/2023)   Hunger Vital Sign   . Worried About Programme researcher, broadcasting/film/video in the Last Year: Never true   . Ran Out of Food in the Last Year: Never true  Transportation Needs: No Transportation Needs (06/11/2023)   PRAPARE - Transportation   . Lack of Transportation (Medical): No   . Lack of Transportation (Non-Medical): No  Housing Stability: Low Risk  (10/14/2023)   Housing Stability Vital Sign   . Unable to Pay for Housing in the Last Year: No   . Number of Times Moved in the Last Year: 0   . Homeless in the Last Year: No    Review of Systems:   A 10 point review of systems is negative, except for the pertinent positives and negatives detailed in the HPI.  Vitals:   Vitals:   04/28/24 1329  BP: 110/70  Pulse: 101  SpO2: 97%  Weight: 63.3 kg (139 lb 9.6 oz)     Body mass index is 21.54 kg/m.  Physical Exam:   Physical Exam Vitals and nursing note reviewed.   Constitutional:      General: She is not in acute distress.    Appearance: Normal appearance. She is not ill-appearing, toxic-appearing or diaphoretic.  HENT:     Head: Normocephalic and atraumatic.     Right Ear: External ear normal.     Left Ear: External ear normal.  Eyes:     Conjunctiva/sclera: Conjunctivae normal.  Cardiovascular:     Rate and Rhythm: Normal rate and regular rhythm.     Pulses: Normal pulses.     Heart sounds: Normal heart sounds.  Pulmonary:     Effort: Pulmonary effort is normal.  Breath sounds: Normal breath sounds.  Musculoskeletal:     Comments: No tenderness to palpation along the spine.  Slight tenderness to palpation in the axilla.  No tenderness with movement.  No increased pain with inspiration or expiration  Neurological:     Mental Status: She is alert.     Assessment and Plan:  No results found for this visit on 04/28/24.   Assessment & Plan Acute exacerbation of chronic thoracic nerve pain with chills and headache, possible viral etiology Chronic thoracic nerve pain with recent exacerbation. Differential includes viral etiology (COVID-19, flu, RSV) and less likely shingles. Consider nerve-related pain from back issues. - Order COVID-19, flu, and RSV tests. - Prescribe prednisone 20 mg daily for one week. Discussed side effects - Prescribe gabapentin 100 mg at bedtime. Discussed side effects: drowsiness, dizziness. Increase dose if needed.  -Increase as tolerated based off of symptoms - Allow Tylenol  for pain as needed. - Order thoracic spine x-ray for disc space loss or structural issues. - Provide follow-up based on test results and treatment response.  Disposition: Follow-up as needed or if not improving  Patient Instructions  I have called in some low-dose prednisone for you to take for the next week.  I also called in some gabapentin and I like for you to start taking at bedtime you can take at 100 mg at bedtime and increase  slowly up to 300 at bedtime to see if this is helpful.  If it is still not helpful at that point I would like for you to follow-up with your PCP.  Please come back later to get the x-ray.   This note has been created using automated tools and reviewed for accuracy by provider.  Patient received an After Visit Summary    Attestation Statement:   I personally performed the service, non-incident to. (WP)   MASON MCCLELLAND MINOR, PA

## 2024-05-01 ENCOUNTER — Inpatient Hospital Stay

## 2024-05-01 ENCOUNTER — Encounter: Payer: Self-pay | Admitting: Emergency Medicine

## 2024-05-01 ENCOUNTER — Encounter: Admission: EM | Disposition: A | Payer: Self-pay | Source: Home / Self Care | Attending: Internal Medicine

## 2024-05-01 ENCOUNTER — Emergency Department

## 2024-05-01 ENCOUNTER — Other Ambulatory Visit: Payer: Self-pay

## 2024-05-01 ENCOUNTER — Inpatient Hospital Stay
Admission: EM | Admit: 2024-05-01 | Discharge: 2024-05-03 | DRG: 854 | Disposition: A | Attending: Internal Medicine | Admitting: Internal Medicine

## 2024-05-01 ENCOUNTER — Inpatient Hospital Stay: Admitting: Anesthesiology

## 2024-05-01 DIAGNOSIS — E119 Type 2 diabetes mellitus without complications: Secondary | ICD-10-CM | POA: Diagnosis present

## 2024-05-01 DIAGNOSIS — R6520 Severe sepsis without septic shock: Secondary | ICD-10-CM | POA: Diagnosis present

## 2024-05-01 DIAGNOSIS — K219 Gastro-esophageal reflux disease without esophagitis: Secondary | ICD-10-CM | POA: Diagnosis present

## 2024-05-01 DIAGNOSIS — E872 Acidosis, unspecified: Secondary | ICD-10-CM | POA: Diagnosis present

## 2024-05-01 DIAGNOSIS — N200 Calculus of kidney: Secondary | ICD-10-CM | POA: Diagnosis present

## 2024-05-01 DIAGNOSIS — N10 Acute tubulo-interstitial nephritis: Secondary | ICD-10-CM | POA: Diagnosis present

## 2024-05-01 DIAGNOSIS — I10 Essential (primary) hypertension: Secondary | ICD-10-CM | POA: Diagnosis present

## 2024-05-01 DIAGNOSIS — Z79899 Other long term (current) drug therapy: Secondary | ICD-10-CM

## 2024-05-01 DIAGNOSIS — Z87891 Personal history of nicotine dependence: Secondary | ICD-10-CM | POA: Diagnosis not present

## 2024-05-01 DIAGNOSIS — I959 Hypotension, unspecified: Secondary | ICD-10-CM | POA: Diagnosis present

## 2024-05-01 DIAGNOSIS — M8580 Other specified disorders of bone density and structure, unspecified site: Secondary | ICD-10-CM | POA: Diagnosis present

## 2024-05-01 DIAGNOSIS — Z8616 Personal history of COVID-19: Secondary | ICD-10-CM | POA: Diagnosis not present

## 2024-05-01 DIAGNOSIS — E785 Hyperlipidemia, unspecified: Secondary | ICD-10-CM | POA: Diagnosis present

## 2024-05-01 DIAGNOSIS — N179 Acute kidney failure, unspecified: Secondary | ICD-10-CM

## 2024-05-01 DIAGNOSIS — Z8249 Family history of ischemic heart disease and other diseases of the circulatory system: Secondary | ICD-10-CM | POA: Diagnosis not present

## 2024-05-01 DIAGNOSIS — E876 Hypokalemia: Secondary | ICD-10-CM | POA: Diagnosis present

## 2024-05-01 DIAGNOSIS — Z7984 Long term (current) use of oral hypoglycemic drugs: Secondary | ICD-10-CM | POA: Diagnosis not present

## 2024-05-01 DIAGNOSIS — N390 Urinary tract infection, site not specified: Secondary | ICD-10-CM

## 2024-05-01 DIAGNOSIS — R10A2 Flank pain, left side: Principal | ICD-10-CM

## 2024-05-01 DIAGNOSIS — N136 Pyonephrosis: Secondary | ICD-10-CM | POA: Diagnosis present

## 2024-05-01 DIAGNOSIS — A419 Sepsis, unspecified organism: Principal | ICD-10-CM | POA: Diagnosis present

## 2024-05-01 DIAGNOSIS — D72819 Decreased white blood cell count, unspecified: Secondary | ICD-10-CM | POA: Diagnosis present

## 2024-05-01 DIAGNOSIS — N139 Obstructive and reflux uropathy, unspecified: Secondary | ICD-10-CM | POA: Diagnosis not present

## 2024-05-01 HISTORY — PX: CYSTOSCOPY W/ URETERAL STENT PLACEMENT: SHX1429

## 2024-05-01 LAB — URINALYSIS, ROUTINE W REFLEX MICROSCOPIC
Bilirubin Urine: NEGATIVE
Glucose, UA: NEGATIVE mg/dL
Hgb urine dipstick: NEGATIVE
Ketones, ur: NEGATIVE mg/dL
Nitrite: POSITIVE — AB
Protein, ur: NEGATIVE mg/dL
Specific Gravity, Urine: 1.013 (ref 1.005–1.030)
WBC, UA: >50 WBC/hpf (ref 0–5)
pH: 7 (ref 5.0–8.0)

## 2024-05-01 LAB — CBC
HCT: 41.3 % (ref 36.0–46.0)
Hemoglobin: 13.5 g/dL (ref 12.0–15.0)
MCH: 31.3 pg (ref 26.0–34.0)
MCHC: 32.7 g/dL (ref 30.0–36.0)
MCV: 95.8 fL (ref 80.0–100.0)
Platelets: 241 K/uL (ref 150–400)
RBC: 4.31 MIL/uL (ref 3.87–5.11)
RDW: 12.7 % (ref 11.5–15.5)
WBC: 2.6 K/uL — ABNORMAL LOW (ref 4.0–10.5)
nRBC: 0 % (ref 0.0–0.2)

## 2024-05-01 LAB — BASIC METABOLIC PANEL WITH GFR
Anion gap: 15 (ref 5–15)
BUN: 23 mg/dL (ref 8–23)
CO2: 27 mmol/L (ref 22–32)
Calcium: 8.7 mg/dL — ABNORMAL LOW (ref 8.9–10.3)
Chloride: 103 mmol/L (ref 98–111)
Creatinine, Ser: 0.92 mg/dL (ref 0.44–1.00)
GFR, Estimated: >60 mL/min (ref >60)
Glucose, Bld: 114 mg/dL — ABNORMAL HIGH (ref 70–99)
Potassium: 3.2 mmol/L — ABNORMAL LOW (ref 3.5–5.1)
Sodium: 145 mmol/L (ref 135–145)

## 2024-05-01 LAB — HIV ANTIBODY (ROUTINE TESTING W REFLEX): HIV Screen 4th Generation wRfx: NONREACTIVE

## 2024-05-01 LAB — GLUCOSE, CAPILLARY
Glucose-Capillary: 111 mg/dL — ABNORMAL HIGH (ref 70–99)
Glucose-Capillary: 138 mg/dL — ABNORMAL HIGH (ref 70–99)

## 2024-05-01 LAB — HEMOGLOBIN A1C
Hgb A1c MFr Bld: 5 % (ref 4.8–5.6)
Mean Plasma Glucose: 96.8 mg/dL

## 2024-05-01 LAB — LACTIC ACID, PLASMA
Lactic Acid, Venous: 3.5 mmol/L (ref 0.5–1.9)
Lactic Acid, Venous: 2.1 mmol/L (ref 0.5–1.9)

## 2024-05-01 SURGERY — CYSTOSCOPY, WITH RETROGRADE PYELOGRAM AND URETERAL STENT INSERTION
Anesthesia: General | Site: Ureter | Laterality: Left

## 2024-05-01 MED ORDER — SODIUM CHLORIDE 0.9 % IV BOLUS
1000.0000 mL | Freq: Once | INTRAVENOUS | Status: AC
Start: 2024-05-01 — End: 2024-05-01
  Administered 2024-05-01: 1000 mL via INTRAVENOUS

## 2024-05-01 MED ORDER — IOHEXOL 180 MG/ML  SOLN
INTRAMUSCULAR | Status: DC | PRN
  Administered 2024-05-01: 10 mL

## 2024-05-01 MED ORDER — SENNOSIDES-DOCUSATE SODIUM 8.6-50 MG PO TABS: 1.0000 | ORAL_TABLET | Freq: Every evening | ORAL | Status: DC | PRN

## 2024-05-01 MED ORDER — PROPOFOL 10 MG/ML IV BOLUS
INTRAVENOUS | Status: AC
  Filled 2024-05-01: qty 20

## 2024-05-01 MED ORDER — HYDRALAZINE HCL 20 MG/ML IJ SOLN: 5.0000 mg | Freq: Four times a day (QID) | INTRAMUSCULAR | Status: DC | PRN

## 2024-05-01 MED ORDER — LACTATED RINGERS IV BOLUS
1500.0000 mL | Freq: Once | INTRAVENOUS | Status: DC
Start: 2024-05-01 — End: 2024-05-02

## 2024-05-01 MED ORDER — OXYCODONE HCL 5 MG PO TABS: 5.0000 mg | ORAL_TABLET | Freq: Once | ORAL | Status: DC | PRN

## 2024-05-01 MED ORDER — LIDOCAINE HCL (CARDIAC) PF 100 MG/5ML IV SOSY
PREFILLED_SYRINGE | INTRAVENOUS | Status: DC | PRN
Start: 2024-05-01 — End: 2024-05-01
  Administered 2024-05-01: 100 mg via INTRAVENOUS

## 2024-05-01 MED ORDER — ONDANSETRON HCL 4 MG PO TABS: 4.0000 mg | ORAL_TABLET | Freq: Four times a day (QID) | ORAL | Status: DC | PRN

## 2024-05-01 MED ORDER — HYDROMORPHONE HCL 1 MG/ML IJ SOLN: 0.5000 mg | INTRAMUSCULAR | Status: DC | PRN

## 2024-05-01 MED ORDER — DEXAMETHASONE SODIUM PHOSPHATE 10 MG/ML IJ SOLN
INTRAMUSCULAR | Status: DC | PRN
  Administered 2024-05-01: 5 mg via INTRAVENOUS

## 2024-05-01 MED ORDER — FENTANYL CITRATE (PF) 100 MCG/2ML IJ SOLN
INTRAMUSCULAR | Status: AC
  Filled 2024-05-01: qty 2

## 2024-05-01 MED ORDER — ENOXAPARIN SODIUM 40 MG/0.4ML IJ SOSY
40.0000 mg | PREFILLED_SYRINGE | INTRAMUSCULAR | Status: DC
  Administered 2024-05-01 – 2024-05-02 (×2): 40 mg via SUBCUTANEOUS
  Filled 2024-05-01 (×2): qty 0.4

## 2024-05-01 MED ORDER — ONDANSETRON HCL 4 MG/2ML IJ SOLN
INTRAMUSCULAR | Status: DC | PRN
  Administered 2024-05-01: 4 mg via INTRAVENOUS

## 2024-05-01 MED ORDER — SODIUM CHLORIDE 0.9 % IV BOLUS: 1000.0000 mL | Freq: Once | INTRAVENOUS | Status: DC

## 2024-05-01 MED ORDER — CHLORHEXIDINE GLUCONATE 0.12 % MT SOLN
OROMUCOSAL | Status: AC
  Filled 2024-05-01: qty 15

## 2024-05-01 MED ORDER — FENTANYL CITRATE (PF) 100 MCG/2ML IJ SOLN
INTRAMUSCULAR | Status: DC | PRN
  Administered 2024-05-01: 25 ug via INTRAVENOUS

## 2024-05-01 MED ORDER — ONDANSETRON HCL 4 MG/2ML IJ SOLN
4.0000 mg | Freq: Once | INTRAMUSCULAR | Status: AC
  Administered 2024-05-01: 4 mg via INTRAVENOUS
  Filled 2024-05-01: qty 2

## 2024-05-01 MED ORDER — OXYCODONE HCL 5 MG/5ML PO SOLN: 5.0000 mg | Freq: Once | ORAL | Status: DC | PRN

## 2024-05-01 MED ORDER — INSULIN ASPART 100 UNIT/ML IJ SOLN
0.0000 [IU] | Freq: Three times a day (TID) | INTRAMUSCULAR | Status: DC
  Filled 2024-05-01: qty 1

## 2024-05-01 MED ORDER — PROPOFOL 10 MG/ML IV BOLUS
INTRAVENOUS | Status: DC | PRN
  Administered 2024-05-01: 100 mg via INTRAVENOUS
  Administered 2024-05-01: 30 mg via INTRAVENOUS
  Administered 2024-05-01: 70 mg via INTRAVENOUS

## 2024-05-01 MED ORDER — POTASSIUM CHLORIDE CRYS ER 20 MEQ PO TBCR: 40.0000 meq | EXTENDED_RELEASE_TABLET | Freq: Once | ORAL | Status: DC

## 2024-05-01 MED ORDER — TAMSULOSIN HCL 0.4 MG PO CAPS
0.4000 mg | ORAL_CAPSULE | Freq: Every day | ORAL | Status: DC
  Administered 2024-05-01 – 2024-05-02 (×2): 0.4 mg via ORAL
  Filled 2024-05-01 (×2): qty 1

## 2024-05-01 MED ORDER — FENTANYL CITRATE (PF) 100 MCG/2ML IJ SOLN: 25.0000 ug | INTRAMUSCULAR | Status: DC | PRN

## 2024-05-01 MED ORDER — LACTATED RINGERS IV SOLN: INTRAVENOUS | Status: DC | PRN

## 2024-05-01 MED ORDER — ACETAMINOPHEN 650 MG RE SUPP
650.0000 mg | Freq: Four times a day (QID) | RECTAL | Status: DC | PRN
  Filled 2024-05-01: qty 2

## 2024-05-01 MED ORDER — ACETAMINOPHEN 325 MG PO TABS: 650.0000 mg | ORAL_TABLET | Freq: Four times a day (QID) | ORAL | Status: DC | PRN

## 2024-05-01 MED ORDER — SODIUM CHLORIDE 0.9 % IR SOLN
Status: DC | PRN
  Administered 2024-05-01: 3000 mL

## 2024-05-01 MED ORDER — MIDAZOLAM HCL 2 MG/2ML IJ SOLN
INTRAMUSCULAR | Status: AC
  Filled 2024-05-01: qty 2

## 2024-05-01 MED ORDER — HYDROCODONE-ACETAMINOPHEN 5-325 MG PO TABS: 1.0000 | ORAL_TABLET | Freq: Four times a day (QID) | ORAL | Status: DC | PRN

## 2024-05-01 MED ORDER — ONDANSETRON HCL 4 MG/2ML IJ SOLN: 4.0000 mg | Freq: Four times a day (QID) | INTRAMUSCULAR | Status: DC | PRN

## 2024-05-01 MED ORDER — MIDAZOLAM HCL 2 MG/2ML IJ SOLN
INTRAMUSCULAR | Status: DC | PRN
  Administered 2024-05-01: 2 mg via INTRAVENOUS

## 2024-05-01 MED ORDER — PANTOPRAZOLE SODIUM 40 MG PO TBEC
40.0000 mg | DELAYED_RELEASE_TABLET | Freq: Every day | ORAL | Status: DC
Start: 2024-05-01 — End: 2024-05-03
  Administered 2024-05-01 – 2024-05-02 (×2): 40 mg via ORAL
  Filled 2024-05-01 (×2): qty 1

## 2024-05-01 MED ORDER — SODIUM CHLORIDE 0.9 % IV SOLN
2.0000 g | INTRAVENOUS | Status: DC
  Administered 2024-05-02 – 2024-05-03 (×2): 2 g via INTRAVENOUS
  Filled 2024-05-01 (×2): qty 20

## 2024-05-01 MED ORDER — SUCCINYLCHOLINE CHLORIDE 200 MG/10ML IV SOSY
PREFILLED_SYRINGE | INTRAVENOUS | Status: DC | PRN
  Administered 2024-05-01: 80 mg via INTRAVENOUS

## 2024-05-01 MED ORDER — MORPHINE SULFATE (PF) 4 MG/ML IV SOLN
4.0000 mg | Freq: Once | INTRAVENOUS | Status: AC
  Administered 2024-05-01: 4 mg via INTRAVENOUS
  Filled 2024-05-01: qty 1

## 2024-05-01 MED ORDER — KETOROLAC TROMETHAMINE 15 MG/ML IJ SOLN
15.0000 mg | Freq: Once | INTRAMUSCULAR | Status: AC
  Administered 2024-05-01: 15 mg via INTRAVENOUS
  Filled 2024-05-01: qty 1

## 2024-05-01 MED ORDER — PHENYLEPHRINE 80 MCG/ML (10ML) SYRINGE FOR IV PUSH (FOR BLOOD PRESSURE SUPPORT)
PREFILLED_SYRINGE | INTRAVENOUS | Status: DC | PRN
  Administered 2024-05-01: 80 ug via INTRAVENOUS
  Administered 2024-05-01: 160 ug via INTRAVENOUS
  Administered 2024-05-01: 80 ug via INTRAVENOUS
  Administered 2024-05-01 (×2): 160 ug via INTRAVENOUS
  Administered 2024-05-01: 80 ug via INTRAVENOUS
  Administered 2024-05-01: 160 ug via INTRAVENOUS
  Administered 2024-05-01: 80 ug via INTRAVENOUS
  Administered 2024-05-01: 160 ug via INTRAVENOUS
  Administered 2024-05-01: 80 ug via INTRAVENOUS
  Administered 2024-05-01 (×2): 160 ug via INTRAVENOUS

## 2024-05-01 MED ORDER — SODIUM CHLORIDE 0.9 % IV SOLN
1.0000 g | Freq: Once | INTRAVENOUS | Status: AC
  Administered 2024-05-01: 1 g via INTRAVENOUS
  Filled 2024-05-01: qty 10

## 2024-05-01 SURGICAL SUPPLY — 15 items
BRUSH SCRUB EZ 4% CHG (MISCELLANEOUS) ×1 IMPLANT
CATH URETL OPEN END 6X70 (CATHETERS) ×1 IMPLANT
GLOVE BIOGEL PI IND STRL 7.5 (GLOVE) ×1 IMPLANT
GOWN STRL REUS W/ TWL LRG LVL3 (GOWN DISPOSABLE) ×1 IMPLANT
GOWN STRL REUS W/ TWL XL LVL3 (GOWN DISPOSABLE) ×1 IMPLANT
GUIDEWIRE STR DUAL SENSOR (WIRE) ×1 IMPLANT
KIT TURNOVER CYSTO (KITS) ×1 IMPLANT
PACK CYSTO AR (MISCELLANEOUS) ×1 IMPLANT
SET CYSTO W/LG BORE CLAMP LF (SET/KITS/TRAYS/PACK) ×1 IMPLANT
SOL .9 NS 3000ML IRR UROMATIC (IV SOLUTION) ×1 IMPLANT
STENT URET 6FRX24 CONTOUR (STENTS) IMPLANT
STENT URET 6FRX26 CONTOUR (STENTS) IMPLANT
SURGILUBE 2OZ TUBE FLIPTOP (MISCELLANEOUS) ×1 IMPLANT
SYR 10ML LL (SYRINGE) ×1 IMPLANT
WATER STERILE IRR 500ML POUR (IV SOLUTION) ×1 IMPLANT

## 2024-05-01 NOTE — Anesthesia Postprocedure Evaluation (Signed)
 Anesthesia Post Note  Patient: Courtney Cochran  Procedure(s) Performed: CYSTOSCOPY, WITH RETROGRADE PYELOGRAM AND URETERAL STENT INSERTION (Left: Ureter)  Patient location during evaluation: PACU Anesthesia Type: General Level of consciousness: awake and alert Pain management: pain level controlled Vital Signs Assessment: post-procedure vital signs reviewed and stable Respiratory status: spontaneous breathing, nonlabored ventilation and respiratory function stable Cardiovascular status: blood pressure returned to baseline and stable Postop Assessment: no apparent nausea or vomiting Anesthetic complications: no   No notable events documented.   Last Vitals:  Vitals:   05/01/24 1814 05/01/24 1829  BP: (!) 87/61 100/62  Pulse: 97 (!) 101  Resp: 16   Temp: 37.1 C   SpO2: 96%     Last Pain:  Vitals:   05/01/24 1824  TempSrc:   PainSc: 2                  Fairy POUR Verania Salberg

## 2024-05-01 NOTE — Anesthesia Preprocedure Evaluation (Signed)
 Anesthesia Evaluation  Patient identified by MRN, date of birth, ID band Patient awake    Reviewed: Allergy & Precautions, NPO status , Patient's Chart, lab work & pertinent test results  History of Anesthesia Complications Negative for: history of anesthetic complications  Airway Mallampati: III  TM Distance: >3 FB Neck ROM: full    Dental  (+) Chipped   Pulmonary neg shortness of breath, former smoker   Pulmonary exam normal        Cardiovascular Exercise Tolerance: Good (-) angina (-) Past MI negative cardio ROS Normal cardiovascular exam     Neuro/Psych negative neurological ROS  negative psych ROS   GI/Hepatic Neg liver ROS,GERD  Controlled,,  Endo/Other  negative endocrine ROS    Renal/GU Renal disease     Musculoskeletal   Abdominal   Peds  Hematology negative hematology ROS (+)   Anesthesia Other Findings Past Medical History: No date: Anemia     Comment:  H/O 3 YEARS AGO 09/11/2019: COVID-19     Comment:  remdesivir  infusions No date: Diverticulosis No date: Family history of adverse reaction to anesthesia     Comment:  PTS SISTER HAS WOKEN UP TWICE DURING SURGERY No date: Foot injury     Comment:  LEFT-FOOT BRUISED AND SWOLLEN PER PT(RELATED TO HER FALL              THAT BROKE HER WRIST) No date: GERD (gastroesophageal reflux disease)     Comment:  RARE No date: Herpes simplex virus infection     Comment:  recurrent 08/2019: History of kidney stones No date: Hyperlipidemia 02/2022: Left ureteral stone No date: Multiple lung nodules on CT     Comment:  stable since 2017 No date: Osteopenia 08/2019: Pneumonia due to COVID-19 virus No date: Sepsis [facility])  Past Surgical History: 01/26/1997: COLONOSCOPY 05/26/2012: COLONOSCOPY 06/25/2017: COLONOSCOPY 07/25/2022: COLONOSCOPY WITH PROPOFOL ; N/A     Comment:  Procedure: COLONOSCOPY WITH PROPOFOL ;  Surgeon: [place],               Ladell POUR, MD;   Location: [facility];  Service:               Gastroenterology;  Laterality: N/A; 10/02/2019: CYSTOSCOPY W/ RETROGRADES; Left     Comment:  Procedure: CYSTOSCOPY WITH RETROGRADE PYELOGRAM;                Surgeon: Francisca Redell BROCKS, MD;  Location: [facility] ORS;                Service: Urology;  Laterality: Left; 04/20/2022: CYSTOSCOPY W/ RETROGRADES     Comment:  Procedure: CYSTOSCOPY WITH RETROGRADE PYELOGRAM;                Surgeon: Francisca Redell BROCKS, MD;  Location: [facility] ORS;                Service: Urology;; 03/28/2022: CYSTOSCOPY W/ URETERAL STENT PLACEMENT; Left     Comment:  Procedure: CYSTOSCOPY WITH STENT REPLACEMENT;  Surgeon:               Twylla Glendia BROCKS, MD;  Location: [facility] ORS;  Service:               Urology;  Laterality: Left; 09/11/2019: CYSTOSCOPY WITH STENT PLACEMENT; Left     Comment:  Procedure: CYSTOSCOPY WITH STENT PLACEMENT;  Surgeon:               Francisca Redell BROCKS, MD;  Location: [facility] ORS;  Service:  Urology;  Laterality: Left; 10/02/2019: CYSTOSCOPY/URETEROSCOPY/HOLMIUM LASER/STENT PLACEMENT;  Left     Comment:  Procedure: CYSTOSCOPY/URETEROSCOPY/HOLMIUM LASER/STENT               EXCHANGE;  Surgeon: Francisca Redell BROCKS, MD;  Location: [facility]              ORS;  Service: Urology;  Laterality: Left; 04/20/2022: CYSTOSCOPY/URETEROSCOPY/HOLMIUM LASER/STENT PLACEMENT; Left     Comment:  Procedure: CYSTOSCOPY/URETEROSCOPY/HOLMIUM LASER/STENT               EXCHANGE;  Surgeon: Francisca Redell BROCKS, MD;  Location: [facility]              ORS;  Service: Urology;  Laterality: Left; No date: DILATION AND CURETTAGE OF UTERUS No date: ESOPHAGOGASTRODUODENOSCOPY     Comment:  1998, 2010 07/25/2022: ESOPHAGOGASTRODUODENOSCOPY; N/A     Comment:  Procedure: ESOPHAGOGASTRODUODENOSCOPY (EGD);  Surgeon:               Toledo, Ladell POUR, MD;  Location: [facility];                Service: Gastroenterology;  Laterality: N/A; No date: KIDNEY STONE SURGERY 02/22/2016: OPEN REDUCTION  INTERNAL FIXATION (ORIF) DISTAL RADIAL  FRACTURE; Right     Comment:  Procedure: OPEN REDUCTION INTERNAL FIXATION (ORIF)               DISTAL RADIAL FRACTURE;  Surgeon: Norleen JINNY Maltos, MD;                [facility]: [facility];  Service: Orthopedics;                Laterality: Right; No date: TUBAL LIGATION  BMI    Body Mass Index: 20.53 kg/m      Reproductive/Obstetrics negative OB ROS                              Anesthesia Physical Anesthesia Plan  ASA: 2  Anesthesia Plan: General ETT   Post-op Pain Management:    Induction: Intravenous  PONV Risk Score and Plan: Ondansetron , Dexamethasone , Midazolam  and Treatment may vary due to age or medical condition  Airway Management Planned: Oral ETT  Additional Equipment:   Intra-op Plan:   Post-operative Plan: Extubation in OR  Informed Consent: I have reviewed the patients History and Physical, chart, labs and discussed the procedure including the risks, benefits and alternatives for the proposed anesthesia with the patient or authorized representative who has indicated his/her understanding and acceptance.     Dental Advisory Given  Plan Discussed with: Anesthesiologist, CRNA and Surgeon  Anesthesia Plan Comments: (Patient consented for risks of anesthesia including but not limited to:  - adverse reactions to medications - damage to eyes, teeth, lips or other oral mucosa - nerve damage due to positioning  - sore throat or hoarseness - Damage to heart, brain, nerves, lungs, other parts of body or loss of life  Patient voiced understanding and assent.)        Anesthesia Quick Evaluation

## 2024-05-01 NOTE — ED Provider Notes (Signed)
 [facility] Provider Note    Event Date/Time   First MD Initiated Contact with Patient 05/01/24 1225     (approximate)   History   Flank Pain   HPI  STACEY SAGO is a 68 y.o. female with history of nephrolithiasis, anemia, GERD, hyperlipidemia, presenting with left flank pain.  States started a week ago, went away but recurred a couple days ago.  Associate with nausea.  No vomiting or diarrhea, no dysuria or hematuria.  No fevers.  She denies any abdominal pain.  States that pain feels very similar to prior kidney stones.  On independent chart review, she was seen by urology in February, has history of recurrent left nephrolithiasis, had KUBs done that were stable.     Physical Exam   Triage Vital Signs: ED Triage Vitals  Encounter Vitals Group     BP 05/01/24 1215 105/81     Girls Systolic BP Percentile --      Girls Diastolic BP Percentile --      Boys Systolic BP Percentile --      Boys Diastolic BP Percentile --      Pulse Rate 05/01/24 1215 (!) 115     Resp 05/01/24 1215 16     Temp 05/01/24 1215 98.2 F (36.8 C)     Temp Source 05/01/24 1215 Oral     SpO2 05/01/24 1215 95 %     Weight 05/01/24 1215 135 lb (61.2 kg)     Height 05/01/24 1215 5' 8 (1.727 m)     Head Circumference --      Peak Flow --      Pain Score 05/01/24 1219 9     Pain Loc --      Pain Education --      Exclude from Growth Chart --     Most recent vital signs: Vitals:   05/01/24 1430 05/01/24 1518  BP: 119/62 (!) 100/58  Pulse: (!) 120 (!) 118  Resp:  15  Temp:  100 F (37.8 C)  SpO2: 99% 97%     General: Awake, no distress.  CV:  Good peripheral perfusion.  Resp:  Normal effort.  Abd:  No distention.  Soft nontender Other:  No overlying rash, no CVA or flank tenderness bilaterally   ED Results / Procedures / Treatments   Labs (all labs ordered are listed, but only abnormal results are displayed) Labs Reviewed  URINALYSIS, ROUTINE W  REFLEX MICROSCOPIC - Abnormal; Notable for the following components:      Result Value   Color, Urine YELLOW (*)    APPearance HAZY (*)    Nitrite POSITIVE (*)    Leukocytes,Ua MODERATE (*)    Bacteria, UA FEW (*)    All other components within normal limits  BASIC METABOLIC PANEL WITH GFR - Abnormal; Notable for the following components:   Potassium 3.2 (*)    Glucose, Bld 114 (*)    Calcium 8.7 (*)    All other components within normal limits  CBC - Abnormal; Notable for the following components:   WBC 2.6 (*)    All other components within normal limits  LACTIC ACID, PLASMA - Abnormal; Notable for the following components:   Lactic Acid, Venous 3.5 (*)    All other components within normal limits  CULTURE, BLOOD (ROUTINE X 2)  CULTURE, BLOOD (ROUTINE X 2)  LACTIC ACID, PLASMA  HIV ANTIBODY (ROUTINE TESTING W REFLEX)  HEMOGLOBIN A1C  RADIOLOGY On my independent interpretation, CT shows left hydronephrosis   PROCEDURES:  Critical Care performed: Yes, see critical care procedure note(s)  .Critical Care  Performed by: Waymond Lorelle Cummins, MD Authorized by: Waymond Lorelle Cummins, MD   Critical care provider statement:    Critical care time (minutes):  40   Critical care was necessary to treat or prevent imminent or life-threatening deterioration of the following conditions:  Sepsis   Critical care was time spent personally by me on the following activities:  Development of treatment plan with patient or surrogate, discussions with consultants, evaluation of patient's response to treatment, examination of patient, ordering and review of laboratory studies, ordering and review of radiographic studies, ordering and performing treatments and interventions, pulse oximetry, re-evaluation of patient's condition and review of old charts    MEDICATIONS ORDERED IN ED: Medications  HYDROcodone -acetaminophen  (NORCO/VICODIN) 5-325 MG per tablet 1 tablet (has no administration in time range)   pantoprazole  (PROTONIX ) EC tablet 40 mg (has no administration in time range)  tamsulosin  (FLOMAX ) capsule 0.4 mg (has no administration in time range)  cefTRIAXone  (ROCEPHIN ) 2 g in sodium chloride  0.9 % 100 mL IVPB (has no administration in time range)  enoxaparin  (LOVENOX ) injection 40 mg (has no administration in time range)  acetaminophen  (TYLENOL ) tablet 650 mg (has no administration in time range)    Or  acetaminophen  (TYLENOL ) suppository 650 mg (has no administration in time range)  HYDROmorphone (DILAUDID) injection 0.5-1 mg (has no administration in time range)  ondansetron  (ZOFRAN ) tablet 4 mg (has no administration in time range)    Or  ondansetron  (ZOFRAN ) injection 4 mg (has no administration in time range)  senna-docusate (Senokot-S) tablet 1 tablet (has no administration in time range)  sodium chloride  0.9 % bolus 1,000 mL (has no administration in time range)  insulin aspart (novoLOG) injection 0-15 Units (has no administration in time range)  potassium chloride  SA (KLOR-CON  M) CR tablet 40 mEq (has no administration in time range)  hydrALAZINE (APRESOLINE) injection 5 mg (has no administration in time range)  lactated ringers  bolus 1,500 mL (has no administration in time range)  sodium chloride  0.9 % bolus 1,000 mL (1,000 mLs Intravenous New Bag/Given 05/01/24 1308)  ondansetron  (ZOFRAN ) injection 4 mg (4 mg Intravenous Given 05/01/24 1307)  morphine  (PF) 4 MG/ML injection 4 mg (4 mg Intravenous Given 05/01/24 1307)  cefTRIAXone  (ROCEPHIN ) 1 g in sodium chloride  0.9 % 100 mL IVPB (1 g Intravenous New Bag/Given 05/01/24 1337)  ketorolac  (TORADOL ) 15 MG/ML injection 15 mg (15 mg Intravenous Given 05/01/24 1431)     IMPRESSION / MDM / ASSESSMENT AND PLAN / ED COURSE  I reviewed the triage vital signs and the nursing notes.                              Differential diagnosis includes, but is not limited to, nephrolithiasis, UTI, musculoskeletal pain, strain, septic stone,  pyelonephritis.  No abdominal pain on exam to suggest colitis or diverticulitis. will get labs, UA, CT renal study, IV fluids, IV morphine , IV Zofran .  Patient's presentation is most consistent with acute presentation with potential threat to life or bodily function.  Independent interpretation of labs and imaging below.  Clinical course as below, lactic acid and blood cultures were added on first blood pressures and tachycardia for concerns of sepsis, added an additional fluid bolus to meet 30 cc per kg.  Consult to hospitalist was agreeable with the  plan for admission and will evaluate the patient.  She is admitted.    Clinical Course as of 05/01/24 1538  Fri May 01, 2024  1312 Independent review of labs, electrolytes not severely deranged, UA is consistent with UTI, will start her on IV antibiotics, she is mildly leukopenic. [TT]  1313 On independent chart review, she had prior cultures that grew E. coli that is pan susceptible.  Will order her for IV ceftriaxone . [TT]  1354 CT ABDOMEN PELVIS WO CONTRAST IMPRESSION: 1. Moderate left-sided hydroureteronephrosis due to an obstructive calculus in the mid left ureter, measuring 5 x 6 x 6.3 mm (APxTRxCC). A second smaller calculus is also present in the upstream ureter, just below the UPJ, measuring 3 mm. 2. Innumerable, nonobstructive bilateral calyceal calculi. 3. Small amount of fluid in the distal esophagus, likely due to gastroesophageal reflux disease. 4. Total colonic diverticulosis. No changes of acute diverticulitis.  Aortic Atherosclerosis (ICD10-I70.0).   [TT]  1400 On reassessment patient still in some pain, will give her some Toradol , heart rate still in the 110s to 120s, she is still getting her fluids.  Will add on some blood cultures as well as lactic acid.  [TT]  1453 Consulted Dr. Twylla from urology who will plan to stent her this afternoon, would like admission to hospitalist.  Will reach out to hospitalist for  admission. [TT]    Clinical Course User Index [TT] Waymond Lorelle Cummins, MD     FINAL CLINICAL IMPRESSION(S) / ED DIAGNOSES   Final diagnoses:  Left flank pain  Kidney stone  Urinary tract infection without hematuria, site unspecified  Sepsis, due to unspecified organism, unspecified whether acute organ dysfunction present [facility])     Rx / DC Orders   ED Discharge Orders     None        Note:  This document was prepared using Dragon voice recognition software and may include unintentional dictation errors.    Waymond Lorelle Cummins, MD 05/01/24 [id number]

## 2024-05-01 NOTE — Interval H&P Note (Signed)
 History and Physical Interval Note:  05/01/2024 4:47 PM  Courtney Cochran  has presented today for surgery, with the diagnosis of left ureteral stone.  The various methods of treatment have been discussed with the patient and family. After consideration of risks, benefits and other options for treatment, the patient has consented to  Procedure(s): CYSTOSCOPY, WITH RETROGRADE PYELOGRAM AND URETERAL STENT INSERTION (Left) as a surgical intervention.  The patient's history has been reviewed, patient examined, no change in status, stable for surgery.  I have reviewed the patient's chart and labs.  Questions were answered to the patient's satisfaction.    CV: RRR Lungs: Clear  Koltan Portocarrero C Demba Nigh

## 2024-05-01 NOTE — Consult Note (Signed)
 Urology Consult  Requesting physician: Lorelle Jerri Cheadle, MD  Reason for consultation: Obstructing ureteral calculus with infection   Assessment/Recommendations:  1.  Obstructing left ureteral calculus with infection UA with significant pyuria and patient with elevated lactate and tachycardia Recommend cystoscopy with left ureteral stent placement The procedure was discussed in detail including potential risks of bleeding, infection/sepsis.  We discussed no attempt will be made to remove her stone due to possibility of bacteremia/septic shock then she will need definitive stone treatment within the next 2-3 weeks She has had stents in the past and has tolerated well.  All questions were answered and she desires to proceed   History of Present Illness: Courtney Cochran is a 68 y.o. female followed by Dr. Francisca for recurrent stone disease.  Left flank pain approximately 1 week ago which resolved and has been mild the last several days. UA in the ED nitrite positive with 11-20 RBC/>50 WBC; mild leukopenia at 2.6 Has been tachycardic while in the ED Approximately 1 week ago she had fairly significant chills without fever and today has had some mild chills  Past Medical History:  Diagnosis Date   Anemia    H/O 70 YEARS AGO   COVID-19 09/11/2019   remdesivir  infusions   Diverticulosis    Family history of adverse reaction to anesthesia    PTS SISTER HAS WOKEN UP TWICE DURING SURGERY   Foot injury    LEFT-FOOT BRUISED AND SWOLLEN PER PT(RELATED TO HER FALL THAT BROKE HER WRIST)   GERD (gastroesophageal reflux disease)    RARE   Herpes simplex virus infection    recurrent   History of kidney stones 08/2019   Hyperlipidemia    Left ureteral stone 02/2022   Multiple lung nodules on CT    stable since 2017   Osteopenia    Pneumonia due to COVID-19 virus 08/2019   Sepsis [facility])     Past Surgical History:  Procedure Laterality Date   COLONOSCOPY  01/26/1997   COLONOSCOPY   05/26/2012   COLONOSCOPY  06/25/2017   COLONOSCOPY WITH PROPOFOL  N/A 07/25/2022   Procedure: COLONOSCOPY WITH PROPOFOL ;  Surgeon: Toledo, Ladell POUR, MD;  Location: [facility];  Service: Gastroenterology;  Laterality: N/A;   CYSTOSCOPY W/ RETROGRADES Left 10/02/2019   Procedure: CYSTOSCOPY WITH RETROGRADE PYELOGRAM;  Surgeon: Francisca Redell BROCKS, MD;  Location: [facility];  Service: Urology;  Laterality: Left;   CYSTOSCOPY W/ RETROGRADES  04/20/2022   Procedure: CYSTOSCOPY WITH RETROGRADE PYELOGRAM;  Surgeon: Francisca Redell BROCKS, MD;  Location: [facility] OR[facility];  Service: Urology;;   CYSTOSCOPY W/ URETERAL STENT PLACEMENT Left 03/28/2022   Procedure: CYSTOSCOPY WITH STENT REPLACEMENT;  Surgeon: Twylla Glendia BROCKS, MD;  Location: [facility];  Service: Urology;  Laterality: Left;   CYSTOSCOPY WITH STENT PLACEMENT Left 09/11/2019   Procedure: CYSTOSCOPY WITH STENT PLACEMENT;  Surgeon: Francisca Redell BROCKS, MD;  Location: [facility];  Service: Urology;  Laterality: Left;   CYSTOSCOPY/URETEROSCOPY/HOLMIUM LASER/STENT PLACEMENT Left 10/02/2019   Procedure: CYSTOSCOPY/URETEROSCOPY/HOLMIUM LASER/STENT EXCHANGE;  Surgeon: Francisca Redell BROCKS, MD;  Location: [facility];  Service: Urology;  Laterality: Left;   CYSTOSCOPY/URETEROSCOPY/HOLMIUM LASER/STENT PLACEMENT Left 04/20/2022   Procedure: CYSTOSCOPY/URETEROSCOPY/HOLMIUM LASER/STENT EXCHANGE;  Surgeon: Francisca Redell BROCKS, MD;  Location: [facility];  Service: Urology;  Laterality: Left;   DILATION AND CURETTAGE OF UTERUS     ESOPHAGOGASTRODUODENOSCOPY     1998, 2010   ESOPHAGOGASTRODUODENOSCOPY N/A 07/25/2022   Procedure: ESOPHAGOGASTRODUODENOSCOPY (EGD);  Surgeon: Toledo, Ladell POUR, MD;  Location: [facility];  Service:  Gastroenterology;  Laterality: N/A;   KIDNEY STONE SURGERY     OPEN REDUCTION INTERNAL FIXATION (ORIF) DISTAL RADIAL FRACTURE Right 02/22/2016   Procedure: OPEN REDUCTION INTERNAL FIXATION (ORIF) DISTAL RADIAL FRACTURE;  Surgeon: Norleen JINNY Maltos, MD;  Location: [facility];  Service: Orthopedics;  Laterality: Right;   TUBAL LIGATION      Home Medications:  No outpatient medications have been marked as taking for the 05/01/24 encounter [facility] Encounter).    Allergies: No Known Allergies  Family History  Problem Relation Age of Onset   CAD Mother    CAD Father    Cancer Brother    Breast cancer Neg Hx     Social History:  reports that she quit smoking about 48 years ago. Her smoking use included cigarettes. She started smoking about 52 years ago. She has a 4 pack-year smoking history. She has never used smokeless tobacco. She reports current alcohol use. She reports that she does not use drugs.  ROS: Negative except as per the HPI  Physical Exam:  Vital signs in last 24 hours: Temp:  [98.2 F (36.8 C)-100 F (37.8 C)] 100 F (37.8 C) (10/03 1518) Pulse Rate:  [115-120] 118 (10/03 1518) Resp:  [15-16] 15 (10/03 1518) BP: (100-119)/(58-81) 100/58 (10/03 1518) SpO2:  [95 %-99 %] 97 % (10/03 1518) Weight:  [61.2 kg] 61.2 kg (10/03 1518) Constitutional:  Alert and oriented, No acute distress HEENT: [place] AT, moist mucus membranes.  Trachea midline, no masses Respiratory: Normal respiratory effort GI: Abdomen is soft, nontender, nondistended, no abdominal masses Psychiatric: Normal mood and affect   Laboratory Data:  Recent Labs    05/01/24 1217  WBC 2.6*  HGB 13.5  HCT 41.3   Recent Labs    05/01/24 1217  NA 145  K 3.2*  CL 103  CO2 27  GLUCOSE 114*  BUN 23  CREATININE 0.92  CALCIUM 8.7*    Radiologic Imaging: CT images were personally reviewed and interpreted  DG OR UROLOGY CYSTO IMAGE ([facility] ONLY) Result Date: 05/01/2024 There is no interpretation for this exam.  This order is for images obtained during a surgical procedure.  Please See Surgeries Tab for more information regarding the procedure.   CT ABDOMEN PELVIS WO CONTRAST Result Date: 05/01/2024 CLINICAL DATA:  Abdominal/flank pain, stone suspected EXAM:  CT ABDOMEN AND PELVIS WITHOUT CONTRAST TECHNIQUE: Multidetector CT imaging of the abdomen and pelvis was performed following the standard protocol without IV contrast. RADIATION DOSE REDUCTION: This exam was performed according to the departmental dose-optimization program which includes automated exposure control, adjustment of the mA and/or kV according to patient size and/or use of iterative reconstruction technique. COMPARISON:  03/28/2022 FINDINGS: Of note, the lack of intravenous contrast limits evaluation of the solid organ parenchyma and vascularity. Lower chest: No focal airspace consolidation or pleural effusion. Unchanged 4 mm left lower lobe nodule (axial 6), benign. Hepatobiliary: No mass.No radiopaque stones or wall thickening of the gallbladder. No intrahepatic or extrahepatic biliary ductal dilation. Pancreas: No mass or main ductal dilation. No peripancreatic inflammation or fluid collection. Spleen: Normal size. No mass. Adrenals/Urinary Tract: No adrenal masses. No renal mass. Moderate left-sided hydroureteronephrosis with extensive perinephric and periureteric stranding. There is an obstructive calculus in the mid left ureter, measuring 5 x 6 x 6.3 mm (APxTRxCC). A second smaller upstream calculus is present in the proximal ureter, measuring 3 mm. Innumerable small calyceal calculi are also noted throughout both kidneys. The urinary bladder is completely decompressed. Stomach/Bowel:Small amount of  fluid present in the distal esophagus. The stomach contains ingested material without focal abnormality. No small bowel wall thickening or inflammation. No small bowel obstruction.Normal appendix. Total colonic diverticulosis. No changes of acute diverticulitis. Vascular/Lymphatic: No aortic aneurysm. Scattered aortoiliac atherosclerosis. No intraabdominal or pelvic lymphadenopathy. Reproductive: Age-related atrophy of the uterus and ovaries. Involuted uterine fibroids. No concerning adnexal mass. No  free pelvic fluid. Other: No pneumoperitoneum, ascites, or mesenteric inflammation. Musculoskeletal: No acute fracture or destructive lesion. Diffuse osteopenia. Multilevel degenerative disc disease of the spine. IMPRESSION: 1. Moderate left-sided hydroureteronephrosis due to an obstructive calculus in the mid left ureter, measuring 5 x 6 x 6.3 mm (APxTRxCC). A second smaller calculus is also present in the upstream ureter, just below the UPJ, measuring 3 mm. 2. Innumerable, nonobstructive bilateral calyceal calculi. 3. Small amount of fluid in the distal esophagus, likely due to gastroesophageal reflux disease. 4. Total colonic diverticulosis. No changes of acute diverticulitis. Aortic Atherosclerosis (ICD10-I70.0). Electronically Signed   By: Rogelia Myers M.D.   On: 05/01/2024 13:48       05/01/2024, 4:39 PM  Glendia Barba,  MD

## 2024-05-01 NOTE — Anesthesia Procedure Notes (Signed)
 Procedure Name: Intubation Date/Time: 05/01/2024 4:50 PM  Performed by: Gillermo Spruce I, CRNAPre-anesthesia Checklist: Patient identified, Emergency Drugs available, Suction available and Patient being monitored Patient Re-evaluated:Patient Re-evaluated prior to induction Oxygen Delivery Method: Circle system utilized Preoxygenation: Pre-oxygenation with 100% oxygen Induction Type: IV induction Ventilation: Mask ventilation without difficulty Laryngoscope Size: McGrath and 3 Grade View: Grade II Tube type: Oral Tube size: 7.0 mm Number of attempts: 1 Airway Equipment and Method: Stylet and Oral airway Placement Confirmation: ETT inserted through vocal cords under direct vision, positive ETCO2 and breath sounds checked- equal and bilateral Secured at: 21 cm Tube secured with: Tape Dental Injury: Teeth and Oropharynx as per pre-operative assessment

## 2024-05-01 NOTE — Op Note (Signed)
    Preoperative diagnosis:  Left ureteral calculus with infection  Postoperative diagnosis:  Left ureteral calculus with infection  Procedure:  Cystoscopy Left ureteral stent placement (24F/24 cm) Left retrograde pyelography with interpretation  Surgeon: Glendia C. Mistee Soliman, M.D.  Anesthesia: General  Complications: None  Intraoperative findings:  Cystoscopy: Bladder mucosa without solid or papillary lesions.  Purulent material noted left hemitrigone Left retrograde pyelogram: Mild-moderate hydronephrosis Slightly turbid urine aspirated left renal pelvis and sent for culture  EBL: Minimal  Specimens: Left renal pelvis urine for C&S  Indication: Courtney Cochran is a 68 y.o. female with a history of recurrent stone disease presenting with an 8 mm lower proximal ureteral calculus with hydronephrosis, pyuria and elevated lactate. After reviewing the management options for treatment, he/she elected to proceed with the above surgical procedure(s). We have discussed the potential benefits and risks of the procedure, side effects of the proposed treatment, the likelihood of the patient achieving the goals of the procedure, and any potential problems that might occur during the procedure or recuperation. Informed consent has been obtained.  Description of procedure:  The patient was taken to the operating room and general anesthesia was induced.  The patient was placed in the dorsal lithotomy position, prepped and draped in the usual sterile fashion, and preoperative antibiotics were administered. A preoperative time-out was performed.   A 21 French cystoscope sheath with obturator was lubricated and passed per urethra.  A 30 degree lens was then placed and panendoscopy was performed with findings as described above.  Attention was directed to the left ureteral orifice and a 0.038 Sensor guidewire was then advanced up the ureter into the renal pelvis under fluoroscopic guidance  without difficulty.  A 24F open-ended ureteral catheter was advanced over the guidewire to the region of the renal pelvis.  The guidewire was removed and 10 mL of slightly turbid and was aspirated and sent for culture.  Contrast was then instilled through the ureteral catheter under fluoroscopy with findings as described above.  The guidewire was replaced and the ureteral catheter was removed.  A 24F/24 cm Contour ureteral stent was advanced over the guidewire under fluoroscopic guidance.  The stent was positioned appropriately under fluoroscopic and cystoscopic guidance.  The wire was then removed with an adequate stent curl noted in the renal pelvis as well as in the bladder.  The bladder was then emptied and the procedure ended.  The patient appeared to tolerate the procedure well and without complications.  After anesthetic reversal the patient was transported to the PACU in stable condition.  Plan: She has been admitted to the hospitalist service for IV antibiotic therapy Will need definitive stone treatment ~2 weeks   Glendia Barba, MD

## 2024-05-01 NOTE — ED Triage Notes (Signed)
 Pt endorses left flank pain around 8 am and worsening as day goes on. Reports hx of kidney stones and feels similar.

## 2024-05-01 NOTE — Progress Notes (Signed)
Read chart

## 2024-05-01 NOTE — Transfer of Care (Signed)
 Immediate Anesthesia Transfer of Care Note  Patient: Courtney Cochran  Procedure(s) Performed: CYSTOSCOPY, WITH RETROGRADE PYELOGRAM AND URETERAL STENT INSERTION (Left: Ureter)  Patient Location: PACU  Anesthesia Type:General  Level of Consciousness: alert  and oriented  Airway & Oxygen Therapy: Patient Spontanous Breathing and Patient connected to face mask oxygen  Post-op Assessment: Report given to RN and Post -op Vital signs reviewed and stable  Post vital signs: Reviewed and stable  Last Vitals:  Vitals Value Taken Time  BP 93/59 05/01/24 17:30  Temp 37.3 C 05/01/24 17:24  Pulse 99 05/01/24 17:32  Resp 17 05/01/24 17:32  SpO2 96 % 05/01/24 17:32  Vitals shown include unfiled device data.  Last Pain:  Vitals:   05/01/24 1730  TempSrc:   PainSc: 0-No pain         Complications: No notable events documented.

## 2024-05-01 NOTE — H&P (View-Only) (Signed)
 Urology Consult  Requesting physician: Lorelle Jerri Cheadle, MD  Reason for consultation: Obstructing ureteral calculus with infection   Assessment/Recommendations:  1.  Obstructing left ureteral calculus with infection UA with significant pyuria and patient with elevated lactate and tachycardia Recommend cystoscopy with left ureteral stent placement The procedure was discussed in detail including potential risks of bleeding, infection/sepsis.  We discussed no attempt will be made to remove her stone due to possibility of bacteremia/septic shock then she will need definitive stone treatment within the next 2-3 weeks She has had stents in the past and has tolerated well.  All questions were answered and she desires to proceed   History of Present Illness: Courtney Cochran is a 68 y.o. female followed by Dr. Francisca for recurrent stone disease.  Left flank pain approximately 1 week ago which resolved and has been mild the last several days. UA in the ED nitrite positive with 11-20 RBC/>50 WBC; mild leukopenia at 2.6 Has been tachycardic while in the ED Approximately 1 week ago she had fairly significant chills without fever and today has had some mild chills  Past Medical History:  Diagnosis Date   Anemia    H/O 70 YEARS AGO   COVID-19 09/11/2019   remdesivir  infusions   Diverticulosis    Family history of adverse reaction to anesthesia    PTS SISTER HAS WOKEN UP TWICE DURING SURGERY   Foot injury    LEFT-FOOT BRUISED AND SWOLLEN PER PT(RELATED TO HER FALL THAT BROKE HER WRIST)   GERD (gastroesophageal reflux disease)    RARE   Herpes simplex virus infection    recurrent   History of kidney stones 08/2019   Hyperlipidemia    Left ureteral stone 02/2022   Multiple lung nodules on CT    stable since 2017   Osteopenia    Pneumonia due to COVID-19 virus 08/2019   Sepsis [facility])     Past Surgical History:  Procedure Laterality Date   COLONOSCOPY  01/26/1997   COLONOSCOPY   05/26/2012   COLONOSCOPY  06/25/2017   COLONOSCOPY WITH PROPOFOL  N/A 07/25/2022   Procedure: COLONOSCOPY WITH PROPOFOL ;  Surgeon: Toledo, Ladell POUR, MD;  Location: [facility];  Service: Gastroenterology;  Laterality: N/A;   CYSTOSCOPY W/ RETROGRADES Left 10/02/2019   Procedure: CYSTOSCOPY WITH RETROGRADE PYELOGRAM;  Surgeon: Francisca Redell BROCKS, MD;  Location: [facility];  Service: Urology;  Laterality: Left;   CYSTOSCOPY W/ RETROGRADES  04/20/2022   Procedure: CYSTOSCOPY WITH RETROGRADE PYELOGRAM;  Surgeon: Francisca Redell BROCKS, MD;  Location: [facility];  Service: Urology;;   CYSTOSCOPY W/ URETERAL STENT PLACEMENT Left 03/28/2022   Procedure: CYSTOSCOPY WITH STENT REPLACEMENT;  Surgeon: Twylla Glendia BROCKS, MD;  Location: [facility];  Service: Urology;  Laterality: Left;   CYSTOSCOPY WITH STENT PLACEMENT Left 09/11/2019   Procedure: CYSTOSCOPY WITH STENT PLACEMENT;  Surgeon: Francisca Redell BROCKS, MD;  Location: [facility];  Service: Urology;  Laterality: Left;   CYSTOSCOPY/URETEROSCOPY/HOLMIUM LASER/STENT PLACEMENT Left 10/02/2019   Procedure: CYSTOSCOPY/URETEROSCOPY/HOLMIUM LASER/STENT EXCHANGE;  Surgeon: Francisca Redell BROCKS, MD;  Location: [facility];  Service: Urology;  Laterality: Left;   CYSTOSCOPY/URETEROSCOPY/HOLMIUM LASER/STENT PLACEMENT Left 04/20/2022   Procedure: CYSTOSCOPY/URETEROSCOPY/HOLMIUM LASER/STENT EXCHANGE;  Surgeon: Francisca Redell BROCKS, MD;  Location: [facility];  Service: Urology;  Laterality: Left;   DILATION AND CURETTAGE OF UTERUS     ESOPHAGOGASTRODUODENOSCOPY     1998, 2010   ESOPHAGOGASTRODUODENOSCOPY N/A 07/25/2022   Procedure: ESOPHAGOGASTRODUODENOSCOPY (EGD);  Surgeon: Toledo, Ladell POUR, MD;  Location: [facility];  Service:  Gastroenterology;  Laterality: N/A;   KIDNEY STONE SURGERY     OPEN REDUCTION INTERNAL FIXATION (ORIF) DISTAL RADIAL FRACTURE Right 02/22/2016   Procedure: OPEN REDUCTION INTERNAL FIXATION (ORIF) DISTAL RADIAL FRACTURE;  Surgeon: Norleen JINNY Maltos, MD;  Location: [facility];  Service: Orthopedics;  Laterality: Right;   TUBAL LIGATION      Home Medications:  No outpatient medications have been marked as taking for the 05/01/24 encounter [facility] Encounter).    Allergies: No Known Allergies  Family History  Problem Relation Age of Onset   CAD Mother    CAD Father    Cancer Brother    Breast cancer Neg Hx     Social History:  reports that she quit smoking about 48 years ago. Her smoking use included cigarettes. She started smoking about 52 years ago. She has a 4 pack-year smoking history. She has never used smokeless tobacco. She reports current alcohol use. She reports that she does not use drugs.  ROS: Negative except as per the HPI  Physical Exam:  Vital signs in last 24 hours: Temp:  [98.2 F (36.8 C)-100 F (37.8 C)] 100 F (37.8 C) (10/03 1518) Pulse Rate:  [115-120] 118 (10/03 1518) Resp:  [15-16] 15 (10/03 1518) BP: (100-119)/(58-81) 100/58 (10/03 1518) SpO2:  [95 %-99 %] 97 % (10/03 1518) Weight:  [61.2 kg] 61.2 kg (10/03 1518) Constitutional:  Alert and oriented, No acute distress HEENT: [place] AT, moist mucus membranes.  Trachea midline, no masses Respiratory: Normal respiratory effort GI: Abdomen is soft, nontender, nondistended, no abdominal masses Psychiatric: Normal mood and affect   Laboratory Data:  Recent Labs    05/01/24 1217  WBC 2.6*  HGB 13.5  HCT 41.3   Recent Labs    05/01/24 1217  NA 145  K 3.2*  CL 103  CO2 27  GLUCOSE 114*  BUN 23  CREATININE 0.92  CALCIUM 8.7*    Radiologic Imaging: CT images were personally reviewed and interpreted  DG OR UROLOGY CYSTO IMAGE ([facility] ONLY) Result Date: 05/01/2024 There is no interpretation for this exam.  This order is for images obtained during a surgical procedure.  Please See Surgeries Tab for more information regarding the procedure.   CT ABDOMEN PELVIS WO CONTRAST Result Date: 05/01/2024 CLINICAL DATA:  Abdominal/flank pain, stone suspected EXAM:  CT ABDOMEN AND PELVIS WITHOUT CONTRAST TECHNIQUE: Multidetector CT imaging of the abdomen and pelvis was performed following the standard protocol without IV contrast. RADIATION DOSE REDUCTION: This exam was performed according to the departmental dose-optimization program which includes automated exposure control, adjustment of the mA and/or kV according to patient size and/or use of iterative reconstruction technique. COMPARISON:  03/28/2022 FINDINGS: Of note, the lack of intravenous contrast limits evaluation of the solid organ parenchyma and vascularity. Lower chest: No focal airspace consolidation or pleural effusion. Unchanged 4 mm left lower lobe nodule (axial 6), benign. Hepatobiliary: No mass.No radiopaque stones or wall thickening of the gallbladder. No intrahepatic or extrahepatic biliary ductal dilation. Pancreas: No mass or main ductal dilation. No peripancreatic inflammation or fluid collection. Spleen: Normal size. No mass. Adrenals/Urinary Tract: No adrenal masses. No renal mass. Moderate left-sided hydroureteronephrosis with extensive perinephric and periureteric stranding. There is an obstructive calculus in the mid left ureter, measuring 5 x 6 x 6.3 mm (APxTRxCC). A second smaller upstream calculus is present in the proximal ureter, measuring 3 mm. Innumerable small calyceal calculi are also noted throughout both kidneys. The urinary bladder is completely decompressed. Stomach/Bowel:Small amount of  fluid present in the distal esophagus. The stomach contains ingested material without focal abnormality. No small bowel wall thickening or inflammation. No small bowel obstruction.Normal appendix. Total colonic diverticulosis. No changes of acute diverticulitis. Vascular/Lymphatic: No aortic aneurysm. Scattered aortoiliac atherosclerosis. No intraabdominal or pelvic lymphadenopathy. Reproductive: Age-related atrophy of the uterus and ovaries. Involuted uterine fibroids. No concerning adnexal mass. No  free pelvic fluid. Other: No pneumoperitoneum, ascites, or mesenteric inflammation. Musculoskeletal: No acute fracture or destructive lesion. Diffuse osteopenia. Multilevel degenerative disc disease of the spine. IMPRESSION: 1. Moderate left-sided hydroureteronephrosis due to an obstructive calculus in the mid left ureter, measuring 5 x 6 x 6.3 mm (APxTRxCC). A second smaller calculus is also present in the upstream ureter, just below the UPJ, measuring 3 mm. 2. Innumerable, nonobstructive bilateral calyceal calculi. 3. Small amount of fluid in the distal esophagus, likely due to gastroesophageal reflux disease. 4. Total colonic diverticulosis. No changes of acute diverticulitis. Aortic Atherosclerosis (ICD10-I70.0). Electronically Signed   By: Rogelia Myers M.D.   On: 05/01/2024 13:48       05/01/2024, 4:39 PM  Glendia Barba,  MD

## 2024-05-01 NOTE — H&P (Signed)
 History and Physical    Courtney Cochran FMW:[id number] DOB: 12/19/1955 DOA: 05/01/2024  PCP: Fernande Ophelia JINNY DOUGLAS, MD (Confirm with patient/family/NH records and if not entered, this has to be entered at [facility] point of entry) Patient coming from: Home  I have personally briefly reviewed patient's old medical records in [facility]  Chief Complaint: Left flank pain  HPI: Courtney Cochran is a 68 y.o. female with medical history significant of kidney stones, HTN, HLD, IIDM presented with sudden onset of left flank pain, fever and chills.  Symptoms started this morning, patient woke up with severe sharp like left flank pain radiating to left groin area, she reported that she had had similar pain when she had kidney stones in the past.  Meantime she also has cycles of subjective fever and chills and feeling nausea but no vomiting.  Denies any dysuria or urinary frequency.  ED Course: Temperature 98.2 tachycardia heart rate 115-120 blood pressure 105/80 utilization 98% on room air.  CT renal stone study positive for obstructive left 6 mm ureteral stone with moderate left-sided hydronephrosis.  Blood work showed WBC 2.6 K3.2, UA showed 3+ WBC positive nitrite.  Patient was given ceftriaxone  x 1 in the ED.  Review of Systems: As per HPI otherwise 14 point review of systems negative.    Past Medical History:  Diagnosis Date   Anemia    H/O 41 YEARS AGO   COVID-19 09/11/2019   remdesivir  infusions   Diverticulosis    Family history of adverse reaction to anesthesia    PTS SISTER HAS WOKEN UP TWICE DURING SURGERY   Foot injury    LEFT-FOOT BRUISED AND SWOLLEN PER PT(RELATED TO HER FALL THAT BROKE HER WRIST)   GERD (gastroesophageal reflux disease)    RARE   Herpes simplex virus infection    recurrent   History of kidney stones 08/2019   Hyperlipidemia    Left ureteral stone 02/2022   Multiple lung nodules on CT    stable since 2017   Osteopenia    Pneumonia due to  COVID-19 virus 08/2019   Sepsis [facility])     Past Surgical History:  Procedure Laterality Date   COLONOSCOPY  01/26/1997   COLONOSCOPY  05/26/2012   COLONOSCOPY  06/25/2017   COLONOSCOPY WITH PROPOFOL  N/A 07/25/2022   Procedure: COLONOSCOPY WITH PROPOFOL ;  Surgeon: Toledo, Ladell POUR, MD;  Location: [facility] ENDOSCOPY;  Service: Gastroenterology;  Laterality: N/A;   CYSTOSCOPY W/ RETROGRADES Left 10/02/2019   Procedure: CYSTOSCOPY WITH RETROGRADE PYELOGRAM;  Surgeon: Francisca Redell BROCKS, MD;  Location: [facility] ORS;  Service: Urology;  Laterality: Left;   CYSTOSCOPY W/ RETROGRADES  04/20/2022   Procedure: CYSTOSCOPY WITH RETROGRADE PYELOGRAM;  Surgeon: Francisca Redell BROCKS, MD;  Location: [facility] ORS;  Service: Urology;;   CYSTOSCOPY W/ URETERAL STENT PLACEMENT Left 03/28/2022   Procedure: CYSTOSCOPY WITH STENT REPLACEMENT;  Surgeon: Twylla Glendia BROCKS, MD;  Location: [facility] ORS;  Service: Urology;  Laterality: Left;   CYSTOSCOPY WITH STENT PLACEMENT Left 09/11/2019   Procedure: CYSTOSCOPY WITH STENT PLACEMENT;  Surgeon: Francisca Redell BROCKS, MD;  Location: [facility] ORS;  Service: Urology;  Laterality: Left;   CYSTOSCOPY/URETEROSCOPY/HOLMIUM LASER/STENT PLACEMENT Left 10/02/2019   Procedure: CYSTOSCOPY/URETEROSCOPY/HOLMIUM LASER/STENT EXCHANGE;  Surgeon: Francisca Redell BROCKS, MD;  Location: [facility] ORS;  Service: Urology;  Laterality: Left;   CYSTOSCOPY/URETEROSCOPY/HOLMIUM LASER/STENT PLACEMENT Left 04/20/2022   Procedure: CYSTOSCOPY/URETEROSCOPY/HOLMIUM LASER/STENT EXCHANGE;  Surgeon: Francisca Redell BROCKS, MD;  Location: [facility] ORS;  Service: Urology;  Laterality: Left;   DILATION AND  CURETTAGE OF UTERUS     ESOPHAGOGASTRODUODENOSCOPY     1998, 2010   ESOPHAGOGASTRODUODENOSCOPY N/A 07/25/2022   Procedure: ESOPHAGOGASTRODUODENOSCOPY (EGD);  Surgeon: Toledo, Ladell POUR, MD;  Location: [facility];  Service: Gastroenterology;  Laterality: N/A;   KIDNEY STONE SURGERY     OPEN REDUCTION INTERNAL FIXATION (ORIF) DISTAL RADIAL FRACTURE  Right 02/22/2016   Procedure: OPEN REDUCTION INTERNAL FIXATION (ORIF) DISTAL RADIAL FRACTURE;  Surgeon: Norleen JINNY Maltos, MD;  Location: [facility];  Service: Orthopedics;  Laterality: Right;   TUBAL LIGATION       reports that she quit smoking about 48 years ago. Her smoking use included cigarettes. She started smoking about 52 years ago. She has a 4 pack-year smoking history. She has never used smokeless tobacco. She reports current alcohol use. She reports that she does not use drugs.  No Known Allergies  Family History  Problem Relation Age of Onset   CAD Mother    CAD Father    Cancer Brother    Breast cancer Neg Hx      Prior to Admission medications   Medication Sig Start Date End Date Taking? Authorizing Provider  BIOTIN PO Take 1 tablet by mouth 2 (two) times daily.    [provider]  Cholecalciferol 125 MCG (5000 UT) TABS Take 1 tablet by mouth daily.    [provider]  HYDROcodone -acetaminophen  (NORCO/VICODIN) 5-325 MG tablet Take 1 tablet by mouth every 6 (six) hours as needed for moderate pain (pain score 4-6). 04/21/24   Francisca Redell BROCKS, MD  indapamide  (LOZOL ) 2.5 MG tablet Take 1 tablet (2.5 mg total) by mouth every other day. 01/20/24   Francisca Redell BROCKS, MD  metFORMIN (GLUCOPHAGE) 500 MG tablet Take 500 mg by mouth 2 (two) times daily with a meal.    [provider]  Multiple Vitamin (MULTI-VITAMIN) tablet Take 1 tablet by mouth daily.     [provider]  pantoprazole  (PROTONIX ) 40 MG tablet Take 40 mg by mouth daily. 05/22/22   [provider]  tamsulosin  (FLOMAX ) 0.4 MG CAPS capsule Take 1 capsule (0.4 mg total) by mouth daily. 04/21/24   Francisca Redell BROCKS, MD    Physical Exam: Vitals:   05/01/24 1215 05/01/24 1304 05/01/24 1430  BP: 105/81  119/62  Pulse: (!) 115  (!) 120  Resp: 16    Temp: 98.2 F (36.8 C)    TempSrc: Oral    SpO2: 95% 99% 99%  Weight: 61.2 kg    Height: 5' 8 (1.727 m)       Constitutional: NAD, calm, comfortable Vitals:   05/01/24 1215 05/01/24 1304 05/01/24 1430  BP: 105/81  119/62  Pulse: (!) 115  (!) 120  Resp: 16    Temp: 98.2 F (36.8 C)    TempSrc: Oral    SpO2: 95% 99% 99%  Weight: 61.2 kg    Height: 5' 8 (1.727 m)     Eyes: PERRL, lids and conjunctivae normal ENMT: Mucous membranes are dry. Posterior pharynx clear of any exudate or lesions.Normal dentition.  Neck: normal, supple, no masses, no thyromegaly Respiratory: clear to auscultation bilaterally, no wheezing, no crackles. Normal respiratory effort. No accessory muscle use.  Cardiovascular: Regular rate and rhythm, no murmurs / rubs / gallops. No extremity edema. 2+ pedal pulses. No carotid bruits.  Abdomen: Left CVA tenderness, no masses palpated. No hepatosplenomegaly. Bowel sounds positive.  Musculoskeletal: no clubbing / cyanosis. No joint deformity upper and lower extremities. Good ROM, no contractures. Normal  muscle tone.  Skin: no rashes, lesions, ulcers. No induration Neurologic: CN 2-12 grossly intact. Sensation intact, DTR normal. Strength 5/5 in all 4.  Psychiatric: Normal judgment and insight. Alert and oriented x 3. Normal mood.     Labs on Admission: I have personally reviewed following labs and imaging studies  CBC: Recent Labs  Lab 05/01/24 1217  WBC 2.6*  HGB 13.5  HCT 41.3  MCV 95.8  PLT 241   Basic Metabolic Panel: Recent Labs  Lab 05/01/24 1217  NA 145  K 3.2*  CL 103  CO2 27  GLUCOSE 114*  BUN 23  CREATININE 0.92  CALCIUM 8.7*   GFR: Estimated Creatinine Clearance: 58.9 mL/min (by C-G formula based on SCr of 0.92 mg/dL). Liver Function Tests: No results for input(s): AST, ALT, ALKPHOS, BILITOT, PROT, ALBUMIN in the last 168 hours. No results for input(s): LIPASE, AMYLASE in the last 168 hours. No results for input(s): AMMONIA in the last 168 hours. Coagulation Profile: No results for input(s): INR, PROTIME in the  last 168 hours. Cardiac Enzymes: No results for input(s): CKTOTAL, CKMB, CKMBINDEX, TROPONINI in the last 168 hours. BNP (last 3 results) No results for input(s): PROBNP in the last 8760 hours. HbA1C: No results for input(s): HGBA1C in the last 72 hours. CBG: No results for input(s): GLUCAP in the last 168 hours. Lipid Profile: No results for input(s): CHOL, HDL, LDLCALC, TRIG, CHOLHDL, LDLDIRECT in the last 72 hours. Thyroid Function Tests: No results for input(s): TSH, T4TOTAL, FREET4, T3FREE, THYROIDAB in the last 72 hours. Anemia Panel: No results for input(s): VITAMINB12, FOLATE, FERRITIN, TIBC, IRON, RETICCTPCT in the last 72 hours. Urine analysis:    Component Value Date/Time   COLORURINE YELLOW (A) 05/01/2024 1217   APPEARANCEUR HAZY (A) 05/01/2024 1217   APPEARANCEUR Cloudy (A) 10/14/2019 1517   LABSPEC 1.013 05/01/2024 1217   LABSPEC 1.016 12/26/2013 1611   PHURINE 7.0 05/01/2024 1217   GLUCOSEU NEGATIVE 05/01/2024 1217   GLUCOSEU Negative 12/26/2013 1611   HGBUR NEGATIVE 05/01/2024 1217   BILIRUBINUR NEGATIVE 05/01/2024 1217   BILIRUBINUR Negative 10/14/2019 1517   BILIRUBINUR Negative 12/26/2013 1611   KETONESUR NEGATIVE 05/01/2024 1217   PROTEINUR NEGATIVE 05/01/2024 1217   NITRITE POSITIVE (A) 05/01/2024 1217   LEUKOCYTESUR MODERATE (A) 05/01/2024 1217   LEUKOCYTESUR Trace 12/26/2013 1611    Radiological Exams on Admission: CT ABDOMEN PELVIS WO CONTRAST Result Date: 05/01/2024 CLINICAL DATA:  Abdominal/flank pain, stone suspected EXAM: CT ABDOMEN AND PELVIS WITHOUT CONTRAST TECHNIQUE: Multidetector CT imaging of the abdomen and pelvis was performed following the standard protocol without IV contrast. RADIATION DOSE REDUCTION: This exam was performed according to the departmental dose-optimization program which includes automated exposure control, adjustment of the mA and/or kV according to patient size and/or use of  iterative reconstruction technique. COMPARISON:  03/28/2022 FINDINGS: Of note, the lack of intravenous contrast limits evaluation of the solid organ parenchyma and vascularity. Lower chest: No focal airspace consolidation or pleural effusion. Unchanged 4 mm left lower lobe nodule (axial 6), benign. Hepatobiliary: No mass.No radiopaque stones or wall thickening of the gallbladder. No intrahepatic or extrahepatic biliary ductal dilation. Pancreas: No mass or main ductal dilation. No peripancreatic inflammation or fluid collection. Spleen: Normal size. No mass. Adrenals/Urinary Tract: No adrenal masses. No renal mass. Moderate left-sided hydroureteronephrosis with extensive perinephric and periureteric stranding. There is an obstructive calculus in the mid left ureter, measuring 5 x 6 x 6.3 mm (APxTRxCC). A second smaller upstream calculus is present in the proximal  ureter, measuring 3 mm. Innumerable small calyceal calculi are also noted throughout both kidneys. The urinary bladder is completely decompressed. Stomach/Bowel:Small amount of fluid present in the distal esophagus. The stomach contains ingested material without focal abnormality. No small bowel wall thickening or inflammation. No small bowel obstruction.Normal appendix. Total colonic diverticulosis. No changes of acute diverticulitis. Vascular/Lymphatic: No aortic aneurysm. Scattered aortoiliac atherosclerosis. No intraabdominal or pelvic lymphadenopathy. Reproductive: Age-related atrophy of the uterus and ovaries. Involuted uterine fibroids. No concerning adnexal mass. No free pelvic fluid. Other: No pneumoperitoneum, ascites, or mesenteric inflammation. Musculoskeletal: No acute fracture or destructive lesion. Diffuse osteopenia. Multilevel degenerative disc disease of the spine. IMPRESSION: 1. Moderate left-sided hydroureteronephrosis due to an obstructive calculus in the mid left ureter, measuring 5 x 6 x 6.3 mm (APxTRxCC). A second smaller calculus is  also present in the upstream ureter, just below the UPJ, measuring 3 mm. 2. Innumerable, nonobstructive bilateral calyceal calculi. 3. Small amount of fluid in the distal esophagus, likely due to gastroesophageal reflux disease. 4. Total colonic diverticulosis. No changes of acute diverticulitis. Aortic Atherosclerosis (ICD10-I70.0). Electronically Signed   By: Rogelia Myers M.D.   On: 05/01/2024 13:48    EKG: Ordered  Assessment/Plan Principal Problem:   Sepsis (HCC) Active Problems:   Obstructive uropathy  (please populate well all problems here in Problem List. (For example, if patient is on BP meds at home and you resume or decide to hold them, it is a problem that needs to be her. Same for CAD, COPD, HLD and so on)   Sepsis without acute endorgan damage Complicated UTI/left-sided pyelonephritis - Sepsis as evidenced by tachycardia, leukopenia, source infection is complicated UTI/left-sided pyelonephritis - Received 1 L IV bolus in the ED, will give another liter IV bolus - Continue ceftriaxone   Left-sided obstructive uropathy Obstructive left ureteral stone with moderate hydronephrosis - Emergency OR for cystoscopy and left-sided ureteral stenting with supplemental  Hypokalemia - P.o. replacement - Repeat potassium level tomorrow  HTN - Hold off BP meds for now as patient is not sepsis - Start as needed hydralazine  IIDM - Hold off metformin as patient received IV contrast today - SSI  GERD - Continue PPI  DVT prophylaxis: Lovenox  Code Status: Full code Family Communication: None at bedside Disposition Plan: Patient is sick with sepsis requiring IV antibiotics and obstructive ureteral stone requiring emergency urology intervention, expect more than 2 midnight hospital stay Consults called: Urology Admission status: PCU admit   Cort ONEIDA Mana MD [facility] Pager [phone]  05/01/2024, 3:11 PM

## 2024-05-02 ENCOUNTER — Encounter: Payer: Self-pay | Admitting: Urology

## 2024-05-02 DIAGNOSIS — N10 Acute tubulo-interstitial nephritis: Secondary | ICD-10-CM | POA: Diagnosis not present

## 2024-05-02 DIAGNOSIS — N139 Obstructive and reflux uropathy, unspecified: Secondary | ICD-10-CM

## 2024-05-02 DIAGNOSIS — A419 Sepsis, unspecified organism: Secondary | ICD-10-CM | POA: Diagnosis not present

## 2024-05-02 DIAGNOSIS — R6520 Severe sepsis without septic shock: Secondary | ICD-10-CM | POA: Diagnosis not present

## 2024-05-02 LAB — BASIC METABOLIC PANEL WITH GFR
Anion gap: 9 (ref 5–15)
BUN: 25 mg/dL — ABNORMAL HIGH (ref 8–23)
CO2: 27 mmol/L (ref 22–32)
Calcium: 7.6 mg/dL — ABNORMAL LOW (ref 8.9–10.3)
Chloride: 105 mmol/L (ref 98–111)
Creatinine, Ser: 0.94 mg/dL (ref 0.44–1.00)
GFR, Estimated: >60 mL/min (ref >60)
Glucose, Bld: 115 mg/dL — ABNORMAL HIGH (ref 70–99)
Potassium: 3.4 mmol/L — ABNORMAL LOW (ref 3.5–5.1)
Sodium: 141 mmol/L (ref 135–145)

## 2024-05-02 LAB — CBC
HCT: 30.8 % — ABNORMAL LOW (ref 36.0–46.0)
Hemoglobin: 10.4 g/dL — ABNORMAL LOW (ref 12.0–15.0)
MCH: 31.8 pg (ref 26.0–34.0)
MCHC: 33.8 g/dL (ref 30.0–36.0)
MCV: 94.2 fL (ref 80.0–100.0)
Platelets: 228 K/uL (ref 150–400)
RBC: 3.27 MIL/uL — ABNORMAL LOW (ref 3.87–5.11)
RDW: 13.2 % (ref 11.5–15.5)
WBC: 26.1 K/uL — ABNORMAL HIGH (ref 4.0–10.5)
nRBC: 0 % (ref 0.0–0.2)

## 2024-05-02 LAB — GLUCOSE, CAPILLARY
Glucose-Capillary: 93 mg/dL (ref 70–99)
Glucose-Capillary: 126 mg/dL — ABNORMAL HIGH (ref 70–99)
Glucose-Capillary: 112 mg/dL — ABNORMAL HIGH (ref 70–99)

## 2024-05-02 MED ORDER — SODIUM CHLORIDE 0.9 % IV BOLUS
500.0000 mL | Freq: Once | INTRAVENOUS | Status: AC
  Administered 2024-05-02: 500 mL via INTRAVENOUS

## 2024-05-02 MED ORDER — POTASSIUM CHLORIDE CRYS ER 20 MEQ PO TBCR
40.0000 meq | EXTENDED_RELEASE_TABLET | ORAL | Status: AC
  Administered 2024-05-02 (×2): 40 meq via ORAL
  Filled 2024-05-02 (×2): qty 2

## 2024-05-02 NOTE — Plan of Care (Signed)

## 2024-05-02 NOTE — Progress Notes (Signed)
  Progress Note   Patient: Courtney Cochran FMW:[id number] DOB: 02-24-1956 DOA: 05/01/2024     1 DOS: the patient was seen and examined on 05/02/2024   Brief hospital course: Courtney Cochran is a 68 y.o. female with medical history significant of kidney stones, HTN, HLD, IIDM presented with sudden onset of left flank pain, fever and chills.  Upon arrival, patient had significant tachycardia, leukopenia with white cell 2.6.  Lactic acid 3.5.  CT scan showed obstructive left ureteral stone with moderate left-sided hydronephrosis. Patient was seen by urology, ureteral stent was placed.  Patient was started on Rocephin .   Principal Problem:   Severe sepsis (HCC) Active Problems:   Obstructive uropathy   Acute pyelonephritis   Assessment and Plan: Severe sepsis secondary to pyelonephritis. Left-sided pyelonephritis secondary to obstructive uropathy. Left-sided kidney stone with hydronephrosis. Patient met severe sepsis criteria with leukopenia with subsequent severe leukocytosis, tachycardia, lactic acidosis, significant hypotension. She is status post ureteral stent.  Urine culture and blood culture sent out, pending results. Currently treated with Rocephin .  Patient received a liter of fluid bolus again this morning for hypotension.  Blood pressure since has been stabilized. Continue current treatment.  Hypokalemia. Replete potassium, recheck levels tomorrow.  Essential hypertension.   Hold off of blood pressure medicines.  Type 2 diabetes. Stable      Subjective:  Patient feeling much better after fluid bolus.  No dizziness.  No shortness of breath.  Physical Exam: Vitals:   05/02/24 0808 05/02/24 0943 05/02/24 0945 05/02/24 0946  BP: 91/65 106/61 (!) 95/59 105/68  Pulse: 84 85 86 97  Resp:      Temp: 98.3 F (36.8 C)     TempSrc:      SpO2: 97% 96% 96% 96%  Weight:      Height:       General exam: Appears calm and comfortable  Respiratory system:  Clear to auscultation. Respiratory effort normal. Cardiovascular system: S1 & S2 heard, RRR. No JVD, murmurs, rubs, gallops or clicks. No pedal edema. Gastrointestinal system: Abdomen is nondistended, soft and nontender. No organomegaly or masses felt. Normal bowel sounds heard. Central nervous system: Alert and oriented. No focal neurological deficits. Extremities: Symmetric 5 x 5 power. Skin: No rashes, lesions or ulcers Psychiatry: Judgement and insight appear normal. Mood & affect appropriate.    Data Reviewed:  Reviewed CT scan results and lab results.  Family Communication: None  Disposition: Status is: Inpatient Remains inpatient appropriate because: Severity of disease, IV treatment     Time spent: 50 minutes  Author: Murvin Mana, MD 05/02/2024 11:58 AM  For on call review [email or web address].

## 2024-05-02 NOTE — Hospital Course (Signed)
 Courtney Cochran is a 68 y.o. female with medical history significant of kidney stones, HTN, HLD, IIDM presented with sudden onset of left flank pain, fever and chills.  Upon arrival, patient had significant tachycardia, leukopenia with white cell 2.6.  Lactic acid 3.5.  CT scan showed obstructive left ureteral stone with moderate left-sided hydronephrosis. Patient was seen by urology, ureteral stent was placed.  Patient was started on Rocephin . Blood culture came back negative, urine culture had less than 10,000 insignificant growth.  Patient is medically stable for discharge.

## 2024-05-03 DIAGNOSIS — N10 Acute tubulo-interstitial nephritis: Secondary | ICD-10-CM | POA: Diagnosis not present

## 2024-05-03 DIAGNOSIS — N139 Obstructive and reflux uropathy, unspecified: Secondary | ICD-10-CM | POA: Diagnosis not present

## 2024-05-03 DIAGNOSIS — R6520 Severe sepsis without septic shock: Secondary | ICD-10-CM | POA: Diagnosis not present

## 2024-05-03 DIAGNOSIS — A419 Sepsis, unspecified organism: Secondary | ICD-10-CM | POA: Diagnosis not present

## 2024-05-03 LAB — BASIC METABOLIC PANEL WITH GFR
Anion gap: 6 (ref 5–15)
BUN: 27 mg/dL — ABNORMAL HIGH (ref 8–23)
CO2: 27 mmol/L (ref 22–32)
Calcium: 7.9 mg/dL — ABNORMAL LOW (ref 8.9–10.3)
Chloride: 109 mmol/L (ref 98–111)
Creatinine, Ser: 0.85 mg/dL (ref 0.44–1.00)
GFR, Estimated: >60 mL/min (ref >60)
Glucose, Bld: 100 mg/dL — ABNORMAL HIGH (ref 70–99)
Potassium: 3.5 mmol/L (ref 3.5–5.1)
Sodium: 142 mmol/L (ref 135–145)

## 2024-05-03 LAB — CBC
HCT: 30.7 % — ABNORMAL LOW (ref 36.0–46.0)
Hemoglobin: 10.4 g/dL — ABNORMAL LOW (ref 12.0–15.0)
MCH: 31.8 pg (ref 26.0–34.0)
MCHC: 33.9 g/dL (ref 30.0–36.0)
MCV: 93.9 fL (ref 80.0–100.0)
Platelets: 243 K/uL (ref 150–400)
RBC: 3.27 MIL/uL — ABNORMAL LOW (ref 3.87–5.11)
RDW: 13.1 % (ref 11.5–15.5)
WBC: 18.6 K/uL — ABNORMAL HIGH (ref 4.0–10.5)
nRBC: 0 % (ref 0.0–0.2)

## 2024-05-03 LAB — MAGNESIUM: Magnesium: 2.4 mg/dL (ref 1.7–2.4)

## 2024-05-03 LAB — URINE CULTURE: Culture: <10000 — AB

## 2024-05-03 MED ORDER — CEPHALEXIN 500 MG PO CAPS: 500.0000 mg | ORAL_CAPSULE | Freq: Four times a day (QID) | ORAL | 0 refills | Status: DC

## 2024-05-03 MED ORDER — PHENOL 1.4 % MT LIQD
1.0000 | OROMUCOSAL | Status: DC | PRN
  Administered 2024-05-03: 1 via OROMUCOSAL
  Filled 2024-05-03: qty 177

## 2024-05-03 MED ORDER — POTASSIUM CHLORIDE CRYS ER 20 MEQ PO TBCR: 40.0000 meq | EXTENDED_RELEASE_TABLET | Freq: Once | ORAL | Status: DC

## 2024-05-03 NOTE — Discharge Summary (Signed)
 Physician Discharge Summary   Patient: Courtney Cochran MRN: [id number] DOB: 12-05-1955  Admit date:     05/01/2024  Discharge date: 05/03/24  Discharge Physician: Murvin Mana   PCP: Fernande Ophelia JINNY DOUGLAS, MD   Recommendations at discharge:   Follow-up with PCP in 1 week Follow-up with urology in 1 to 2 weeks.  Discharge Diagnoses: Principal Problem:   Severe sepsis (HCC) Active Problems:   Obstructive uropathy   Acute pyelonephritis  Resolved Problems:   * No resolved hospital problems. *  Hospital Course: HIROMI KNODEL is a 68 y.o. female with medical history significant of kidney stones, HTN, HLD, IIDM presented with sudden onset of left flank pain, fever and chills.  Upon arrival, patient had significant tachycardia, leukopenia with white cell 2.6.  Lactic acid 3.5.  CT scan showed obstructive left ureteral stone with moderate left-sided hydronephrosis. Patient was seen by urology, ureteral stent was placed.  Patient was started on Rocephin . Blood culture came back negative, urine culture had less than 10,000 insignificant growth.  Patient is medically stable for discharge.  Assessment and Plan: Severe sepsis secondary to pyelonephritis. Left-sided pyelonephritis secondary to obstructive uropathy. Left-sided kidney stone with hydronephrosis. Patient met severe sepsis criteria with leukopenia with subsequent severe leukocytosis, tachycardia, lactic acidosis, significant hypotension. She is status post ureteral stent.  Currently treated with Rocephin .  Patient received a liter of fluid bolus again 10/4 for hypotension.  Blood pressure since has been stabilized. Blood culture has no growth, urine culture has insignificant growth.  Medically stable for discharge.  Will continue 5 days of Keflex .   Hypokalemia. Potassium 3.5, give additional 40 mEq of potassium orally   Essential hypertension.  Resume home treatment.   Type 2 diabetes. Stable        Consultants: Urology Procedures performed: Ureteral stent. Disposition: Home Diet recommendation:  Discharge Diet Orders (From admission, onward)     Start     Ordered   05/03/24 0000  Diet - low sodium heart healthy        05/03/24 1300           Cardiac diet DISCHARGE MEDICATION: Allergies as of 05/03/2024   No Known Allergies      Medication List     STOP taking these medications    metFORMIN 500 MG tablet Commonly known as: GLUCOPHAGE   predniSONE 20 MG tablet Commonly known as: DELTASONE       TAKE these medications    BIOTIN PO Take 1 tablet by mouth 2 (two) times daily.   cephALEXin  500 MG capsule Commonly known as: KEFLEX  Take 1 capsule (500 mg total) by mouth 4 (four) times daily for 5 days.   Cholecalciferol 125 MCG (5000 UT) Tabs Take 1 tablet by mouth daily.   gabapentin 100 MG capsule Commonly known as: NEURONTIN Take 100 mg by mouth at bedtime.   HYDROcodone -acetaminophen  5-325 MG tablet Commonly known as: NORCO/VICODIN Take 1 tablet by mouth every 6 (six) hours as needed for moderate pain (pain score 4-6).   indapamide  2.5 MG tablet Commonly known as: LOZOL  Take 1 tablet (2.5 mg total) by mouth every other day.   Multi-Vitamin tablet Take 1 tablet by mouth daily.   pantoprazole  40 MG tablet Commonly known as: PROTONIX  Take 40 mg by mouth daily.   tamsulosin  0.4 MG Caps capsule Commonly known as: FLOMAX  Take 1 capsule (0.4 mg total) by mouth daily.               Discharge  Care Instructions  (From admission, onward)           Start     Ordered   05/03/24 0000  Discharge wound care:       Comments: none   05/03/24 1300            Follow-up Information     Fernande Ophelia PARAS III, MD Follow up in 1 week(s).   Specialty: Internal Medicine Contact information: [address] [employer] [place] [address] [phone]         Twylla Glendia BROCKS, MD Follow up in 1 week(s).   Specialty:  Urology Contact information: [address] [place] [address] [phone]                Discharge Exam: Fredricka Weights   05/01/24 1215 05/01/24 1518 05/01/24 1814  Weight: 61.2 kg 61.2 kg 62 kg   General exam: Appears calm and comfortable  Respiratory system: Clear to auscultation. Respiratory effort normal. Cardiovascular system: S1 & S2 heard, RRR. No JVD, murmurs, rubs, gallops or clicks. No pedal edema. Gastrointestinal system: Abdomen is nondistended, soft and nontender. No organomegaly or masses felt. Normal bowel sounds heard. Central nervous system: Alert and oriented. No focal neurological deficits. Extremities: Symmetric 5 x 5 power. Skin: No rashes, lesions or ulcers Psychiatry: Judgement and insight appear normal. Mood & affect appropriate.    Condition at discharge: good  The results of significant diagnostics from this hospitalization (including imaging, microbiology, ancillary and laboratory) are listed below for reference.   Imaging Studies: DG OR UROLOGY CYSTO IMAGE ([facility] ONLY) Result Date: 05/01/2024 There is no interpretation for this exam.  This order is for images obtained during a surgical procedure.  Please See Surgeries Tab for more information regarding the procedure.   CT ABDOMEN PELVIS WO CONTRAST Result Date: 05/01/2024 CLINICAL DATA:  Abdominal/flank pain, stone suspected EXAM: CT ABDOMEN AND PELVIS WITHOUT CONTRAST TECHNIQUE: Multidetector CT imaging of the abdomen and pelvis was performed following the standard protocol without IV contrast. RADIATION DOSE REDUCTION: This exam was performed according to the departmental dose-optimization program which includes automated exposure control, adjustment of the mA and/or kV according to patient size and/or use of iterative reconstruction technique. COMPARISON:  03/28/2022 FINDINGS: Of note, the lack of intravenous contrast limits evaluation of the solid organ parenchyma and  vascularity. Lower chest: No focal airspace consolidation or pleural effusion. Unchanged 4 mm left lower lobe nodule (axial 6), benign. Hepatobiliary: No mass.No radiopaque stones or wall thickening of the gallbladder. No intrahepatic or extrahepatic biliary ductal dilation. Pancreas: No mass or main ductal dilation. No peripancreatic inflammation or fluid collection. Spleen: Normal size. No mass. Adrenals/Urinary Tract: No adrenal masses. No renal mass. Moderate left-sided hydroureteronephrosis with extensive perinephric and periureteric stranding. There is an obstructive calculus in the mid left ureter, measuring 5 x 6 x 6.3 mm (APxTRxCC). A second smaller upstream calculus is present in the proximal ureter, measuring 3 mm. Innumerable small calyceal calculi are also noted throughout both kidneys. The urinary bladder is completely decompressed. Stomach/Bowel:Small amount of fluid present in the distal esophagus. The stomach contains ingested material without focal abnormality. No small bowel wall thickening or inflammation. No small bowel obstruction.Normal appendix. Total colonic diverticulosis. No changes of acute diverticulitis. Vascular/Lymphatic: No aortic aneurysm. Scattered aortoiliac atherosclerosis. No intraabdominal or pelvic lymphadenopathy. Reproductive: Age-related atrophy of the uterus and ovaries. Involuted uterine fibroids. No concerning adnexal mass. No free pelvic fluid. Other: No pneumoperitoneum, ascites, or mesenteric  inflammation. Musculoskeletal: No acute fracture or destructive lesion. Diffuse osteopenia. Multilevel degenerative disc disease of the spine. IMPRESSION: 1. Moderate left-sided hydroureteronephrosis due to an obstructive calculus in the mid left ureter, measuring 5 x 6 x 6.3 mm (APxTRxCC). A second smaller calculus is also present in the upstream ureter, just below the UPJ, measuring 3 mm. 2. Innumerable, nonobstructive bilateral calyceal calculi. 3. Small amount of fluid in the  distal esophagus, likely due to gastroesophageal reflux disease. 4. Total colonic diverticulosis. No changes of acute diverticulitis. Aortic Atherosclerosis (ICD10-I70.0). Electronically Signed   By: Rogelia Myers M.D.   On: 05/01/2024 13:48   MM 3D DIAGNOSTIC MAMMOGRAM BILATERAL BREAST Result Date: 04/09/2024 CLINICAL DATA:  BI-RADS 3 follow-up of a RIGHT breast asymmetry, initiated August 2024. Patient reports weight loss. EXAM: DIGITAL DIAGNOSTIC BILATERAL MAMMOGRAM WITH TOMOSYNTHESIS AND CAD TECHNIQUE: Bilateral digital diagnostic mammography and breast tomosynthesis was performed. The images were evaluated with computer-aided detection. COMPARISON:  Previous exam(s). ACR Breast Density Category b: There are scattered areas of fibroglandular density. FINDINGS: Diagnostic images of the RIGHT breast demonstrate mammographic stability of an asymmetry in the RIGHT inner breast at posterior depth, best seen on CC view. No new suspicious findings are noted. No suspicious mass, distortion, or microcalcifications are identified to suggest presence of malignancy in the LEFT breast. IMPRESSION: 1. Stable probably benign RIGHT breast asymmetry. This likely reflects patient's baseline configuration of fibroglandular tissue with increased conspicuity in 2024 due to interval weight loss. Recommend follow-up diagnostic mammogram in 1 year. This will establish 2 years of definitive stability. 2. No mammographic evidence of malignancy in the LEFT breast. RECOMMENDATION: Recommend bilateral diagnostic mammogram (with RIGHT and LEFT breast ultrasound if deemed necessary) in 1 year. Patient is due for contralateral screening at this point in time. I have discussed the findings and recommendations with the patient. If applicable, a reminder letter will be sent to the patient regarding the next appointment. BI-RADS CATEGORY  3: Probably benign. Electronically Signed   By: Corean Salter M.D.   On: 04/09/2024 15:36     Microbiology: Results for orders placed or performed during the hospital encounter of 05/01/24  Culture, blood (routine x 2)     Status: None (Preliminary result)   Collection Time: 05/01/24  2:07 PM   Specimen: BLOOD LEFT HAND  Result Value Ref Range Status   Specimen Description BLOOD LEFT HAND  Final   Special Requests   Final    BOTTLES DRAWN AEROBIC AND ANAEROBIC Blood Culture results may not be optimal due to an inadequate volume of blood received in culture bottles   Culture   Final    NO GROWTH 2 DAYS Performed at [facility], [address], [place], [place] [address]    Report Status PENDING  Incomplete  Culture, blood (routine x 2)     Status: None (Preliminary result)   Collection Time: 05/01/24  2:19 PM   Specimen: BLOOD RIGHT HAND  Result Value Ref Range Status   Specimen Description BLOOD RIGHT HAND  Final   Special Requests   Final    BOTTLES DRAWN AEROBIC AND ANAEROBIC Blood Culture results may not be optimal due to an inadequate volume of blood received in culture bottles   Culture   Final    NO GROWTH 2 DAYS Performed at [facility], [address] [place], [place] [address]    Report Status PENDING  Incomplete  Urine Culture     Status: Abnormal   Collection Time: 05/02/24  9:41 AM   Specimen: Urine, Cystoscope  Result Value Ref Range Status   Specimen Description   Final    URINE, RANDOM Performed at [facility], [address], [place], [place] [address]    Special Requests   Final    NONE Performed at [facility], [address] [place], [place] [address]    Culture (A)  Final    <10,000 COLONIES/mL INSIGNIFICANT GROWTH Performed at [facility], [address] [place], [place] [address]    Report Status 05/03/2024 FINAL  Final    Labs: CBC: Recent Labs  Lab 05/01/24 1217 05/02/24 0526 05/03/24 0404  WBC 2.6* 26.1* 18.6*  HGB 13.5 10.4* 10.4*  HCT 41.3 30.8* 30.7*  MCV 95.8 94.2  93.9  PLT 241 228 243   Basic Metabolic Panel: Recent Labs  Lab 05/01/24 1217 05/02/24 0526 05/03/24 0404  NA 145 141 142  K 3.2* 3.4* 3.5  CL 103 105 109  CO2 27 27 27   GLUCOSE 114* 115* 100*  BUN 23 25* 27*  CREATININE 0.92 0.94 0.85  CALCIUM 8.7* 7.6* 7.9*  MG  --   --  2.4   Liver Function Tests: No results for input(s): AST, ALT, ALKPHOS, BILITOT, PROT, ALBUMIN in the last 168 hours. CBG: Recent Labs  Lab 05/01/24 1826 05/01/24 2143 05/02/24 0806 05/02/24 1222 05/02/24 1623  GLUCAP 111* 138* 93 126* 112*    Discharge time spent: .  Signed: Murvin Mana, MD [facility] 05/03/2024

## 2024-05-03 NOTE — Plan of Care (Signed)

## 2024-05-05 ENCOUNTER — Other Ambulatory Visit: Payer: Self-pay

## 2024-05-05 ENCOUNTER — Telehealth: Payer: Self-pay

## 2024-05-05 ENCOUNTER — Other Ambulatory Visit: Payer: Self-pay | Admitting: Urology

## 2024-05-05 DIAGNOSIS — N201 Calculus of ureter: Secondary | ICD-10-CM

## 2024-05-05 DIAGNOSIS — N200 Calculus of kidney: Secondary | ICD-10-CM

## 2024-05-05 MED ORDER — CEPHALEXIN 500 MG PO CAPS: 500.0000 mg | ORAL_CAPSULE | Freq: Four times a day (QID) | ORAL | 0 refills | Status: AC

## 2024-05-05 NOTE — Progress Notes (Unsigned)
 Surgical Physician Order Form [facility]  Dr. Redell Burnet, MD  * Scheduling expectation : 2 to 4 weeks  *Length of Case: 1 hour  *Clearance needed: no  *Anticoagulation Instructions: May continue all anticoagulants  *Aspirin Instructions: Ok to continue all  *Post-op visit Date/Instructions:  tbd  *Diagnosis: Left Ureteral Stone  *Procedure: left  Ureteroscopy w/laser lithotripsy & stent exchange (47643)   Additional orders: N/A  -Admit type: OUTpatient  -Anesthesia: General  -VTE Prophylaxis Standing Order SCD's       Other:   -Standing Lab Orders Per Anesthesia    Lab other: None  -Standing Test orders EKG/Chest x-ray per Anesthesia       Test other:   - Medications:  Ancef  2gm IV  -Other orders:  N/A

## 2024-05-05 NOTE — Telephone Encounter (Signed)
 Pt had stent placed for an infected stone on 10/3. After confirming with Dr. Twylla that the stent should probably stay in 2-3 weeks. Pt was concerned about her up coming trips and leaving the country. She wants to know if she will have surgery before hand or after. I called back and I left a voice message for her to give us  a call back with more definitive dates of when she will be gone. She states being gone next week 10/14 -10/17 then leaving for [place] right after. I don't know those dates for [place]. She travels for work. She will follow up with Dr. Francisca for her stone.

## 2024-05-06 ENCOUNTER — Telehealth: Payer: Self-pay

## 2024-05-06 ENCOUNTER — Other Ambulatory Visit: Payer: Self-pay | Admitting: Physician Assistant

## 2024-05-06 LAB — CULTURE, BLOOD (ROUTINE X 2)
Culture: NO GROWTH
Culture: NO GROWTH

## 2024-05-06 MED ORDER — OXYBUTYNIN CHLORIDE ER 10 MG PO TB24: 10.0000 mg | ORAL_TABLET | Freq: Every day | ORAL | 1 refills | Status: DC

## 2024-05-06 MED ORDER — TAMSULOSIN HCL 0.4 MG PO CAPS: 0.4000 mg | ORAL_CAPSULE | Freq: Every day | ORAL | 1 refills | Status: DC

## 2024-05-06 NOTE — Progress Notes (Signed)
   [facility] Surgical Posting Form  Surgery Date: Date: 06/01/2024  Surgeon: Dr. Redell Burnet, MD  Inpt ( No  )   Outpt (Yes)   Obs ( No  )   Diagnosis: N20.1 Right Ureteral Stone  -CPT: [phone]  Surgery: Right Ureteroscopy with Laser Lithotripsy and Stent Exchange  Stop Anticoagulations: No, may continue all  Cardiac/Medical/Pulmonary Clearance needed: no  *Orders entered into EPIC  Date: 05/06/24   *Case booked in EPIC  Date: 05/06/24  *Notified pt of Surgery: Date: 05/06/24  PRE-OP UA & CX: no  *Placed into Prior Authorization Work Que Date: 05/06/24  Assistant/laser/rep:No

## 2024-05-06 NOTE — Telephone Encounter (Signed)
 Per Dr. Francisca, Patient is to be scheduled for Right Ureteroscopy with Laser Lithotripsy and Stent Exchange   Courtney Cochran was contacted and possible surgical dates were discussed, Monday November 3rd, 2025 was agreed upon for surgery.   Patient was directed to call [phone] between 1-3pm the day before surgery to find out surgical arrival time.  Instructions were given not to eat or drink from midnight on the night before surgery and have a driver for the day of surgery. On the surgery day patient was instructed to enter through the Medical Mall entrance of [facility] report the Same Day Surgery desk.   Pre-Admit Testing will be in contact via phone to set up an interview with the anesthesia team to review your history and medications prior to surgery.   Reminder of this information was sent via MyChart to the patient.

## 2024-05-19 ENCOUNTER — Other Ambulatory Visit: Payer: Self-pay | Admitting: Urology

## 2024-05-26 ENCOUNTER — Other Ambulatory Visit: Payer: Self-pay

## 2024-05-26 ENCOUNTER — Encounter
Admission: RE | Admit: 2024-05-26 | Discharge: 2024-05-26 | Disposition: A | Discharge status: Home / Self Care | Source: Ambulatory Visit | Attending: Urology | Admitting: Urology

## 2024-05-26 VITALS — Ht 68.0 in | Wt 138.0 lb

## 2024-05-26 DIAGNOSIS — R7989 Other specified abnormal findings of blood chemistry: Secondary | ICD-10-CM

## 2024-05-26 DIAGNOSIS — A419 Sepsis, unspecified organism: Secondary | ICD-10-CM

## 2024-05-26 DIAGNOSIS — E782 Mixed hyperlipidemia: Secondary | ICD-10-CM

## 2024-05-26 HISTORY — DX: Unspecified malignant neoplasm of skin, unspecified: C44.90

## 2024-05-26 NOTE — Patient Instructions (Addendum)
 Your procedure is scheduled on: Monday 06/01/24 Report to the Registration Desk on the 1st floor of the Medical Mall. To find out your arrival time, please call [phone] between 1PM - 3PM on: Friday 05/29/24 If your arrival time is 6:00 am, do not arrive before that time as the Medical Mall entrance doors do not open until 6:00 am.  REMEMBER: Instructions that are not followed completely may result in serious medical risk, up to and including death; or upon the discretion of your surgeon and anesthesiologist your surgery may need to be rescheduled.  Do not eat food or drink any liquids after midnight the night before surgery.  No gum chewing or hard candies.  One week prior to surgery: Stop Anti-inflammatories (NSAIDS) such as Advil, Aleve, Ibuprofen, Motrin, Naproxen, Naprosyn and Aspirin based products such as Excedrin, Goody's Powder, BC Powder.  You may however, continue to take Tylenol  if needed for pain up until the day of surgery.  Stop ANY OVER THE COUNTER supplements and vitamins until after surgery.  Continue taking all of your other prescription medications up until the day of surgery. EXCEPTION: Tirzepatide, skip Thursday 05/28/24 dose.  ON THE DAY OF SURGERY ONLY TAKE THESE MEDICATIONS WITH SIPS OF WATER :  oxybutynin  (DITROPAN -XL) 10 MG 24 hr tablet  pantoprazole  (PROTONIX ) 40 MG tablet  tamsulosin  (FLOMAX ) 0.4 MG CAPS capsule   No Alcohol for 24 hours before or after surgery.  No Smoking including e-cigarettes for 24 hours before surgery.  No chewable tobacco products for at least 6 hours before surgery.  No nicotine patches on the day of surgery.  Do not use any recreational drugs for at least a week (preferably 2 weeks) before your surgery.  Please be advised that the combination of cocaine and anesthesia may have negative outcomes, up to and including death. If you test positive for cocaine, your surgery will be cancelled.  On the morning of surgery brush  your teeth with toothpaste and water , you may rinse your mouth with mouthwash if you wish. Do not swallow any toothpaste or mouthwash.  Shower morning of your procedure  Do not wear lotions, powders, or perfumes.   Do not shave body hair from the neck down 48 hours before surgery.  Wear comfortable clothing (specific to your surgery type) to the hospital.  Do not wear jewelry, make-up, hairpins, clips or nail polish.  For welded (permanent) jewelry: bracelets, anklets, waist bands, etc.  Please have this removed prior to surgery.  If it is not removed, there is a chance that hospital personnel will need to cut it off on the day of surgery.  Contact lenses, hearing aids and dentures may not be worn into surgery.  Do not bring valuables to the hospital. [facility] is not responsible for any missing/lost belongings or valuables.   Notify your doctor if there is any change in your medical condition (cold, fever, infection).  After surgery, you can help prevent lung complications by doing breathing exercises.  Take deep breaths and cough every 1-2 hours. Your doctor may order a device called an Incentive Spirometer to help you take deep breaths.  If you are being discharged the day of surgery, you will not be allowed to drive home. You will need a responsible individual to drive you home and stay with you for 24 hours after surgery.   Please call the Pre-admissions Testing Dept. at [phone] if you have any questions about these instructions.  Surgery Visitation Policy:  Patients having surgery  or a procedure may have two visitors.  Children under the age of 7 must have an adult with them who is not the patient.   Merchandiser, Retail to address health-related social needs:  [email or web address]

## 2024-05-27 ENCOUNTER — Encounter
Admission: RE | Admit: 2024-05-27 | Discharge: 2024-05-27 | Disposition: A | Discharge status: Home / Self Care | Source: Ambulatory Visit | Attending: Urology | Admitting: Urology

## 2024-05-27 DIAGNOSIS — E782 Mixed hyperlipidemia: Secondary | ICD-10-CM | POA: Diagnosis not present

## 2024-05-27 DIAGNOSIS — R6520 Severe sepsis without septic shock: Secondary | ICD-10-CM | POA: Insufficient documentation

## 2024-05-27 DIAGNOSIS — R7989 Other specified abnormal findings of blood chemistry: Secondary | ICD-10-CM | POA: Diagnosis not present

## 2024-05-27 DIAGNOSIS — A419 Sepsis, unspecified organism: Secondary | ICD-10-CM | POA: Diagnosis not present

## 2024-05-27 DIAGNOSIS — Z01810 Encounter for preprocedural cardiovascular examination: Secondary | ICD-10-CM | POA: Insufficient documentation

## 2024-06-01 ENCOUNTER — Ambulatory Visit: Payer: Self-pay | Admitting: Urgent Care

## 2024-06-01 ENCOUNTER — Encounter: Payer: Self-pay | Admitting: Urology

## 2024-06-01 ENCOUNTER — Encounter
Admission: RE | Disposition: A | Discharge status: Home / Self Care | Payer: Self-pay | Source: Home / Self Care | Attending: Urology

## 2024-06-01 ENCOUNTER — Ambulatory Visit

## 2024-06-01 ENCOUNTER — Ambulatory Visit
Admission: RE | Admit: 2024-06-01 | Discharge: 2024-06-01 | Disposition: A | Discharge status: Home / Self Care | Attending: Urology | Admitting: Urology

## 2024-06-01 ENCOUNTER — Other Ambulatory Visit: Payer: Self-pay

## 2024-06-01 DIAGNOSIS — N201 Calculus of ureter: Secondary | ICD-10-CM | POA: Diagnosis present

## 2024-06-01 DIAGNOSIS — Z87891 Personal history of nicotine dependence: Secondary | ICD-10-CM | POA: Diagnosis not present

## 2024-06-01 DIAGNOSIS — K219 Gastro-esophageal reflux disease without esophagitis: Secondary | ICD-10-CM | POA: Diagnosis not present

## 2024-06-01 DIAGNOSIS — N202 Calculus of kidney with calculus of ureter: Secondary | ICD-10-CM | POA: Diagnosis not present

## 2024-06-01 HISTORY — PX: CYSTOSCOPY/URETEROSCOPY/HOLMIUM LASER/STENT PLACEMENT: SHX6546

## 2024-06-01 SURGERY — CYSTOSCOPY/URETEROSCOPY/HOLMIUM LASER/STENT PLACEMENT
Anesthesia: General | Site: Ureter | Laterality: Left

## 2024-06-01 MED ORDER — FENTANYL CITRATE (PF) 100 MCG/2ML IJ SOLN
INTRAMUSCULAR | Status: AC
  Filled 2024-06-01: qty 2

## 2024-06-01 MED ORDER — LACTATED RINGERS IV SOLN
INTRAVENOUS | Status: DC | PRN
Start: 2024-06-01 — End: 2024-06-01

## 2024-06-01 MED ORDER — CHLORHEXIDINE GLUCONATE 0.12 % MT SOLN
15.0000 mL | Freq: Once | OROMUCOSAL | Status: AC
  Administered 2024-06-01: 15 mL via OROMUCOSAL

## 2024-06-01 MED ORDER — ORAL CARE MOUTH RINSE: 15.0000 mL | Freq: Once | OROMUCOSAL | Status: AC

## 2024-06-01 MED ORDER — IOHEXOL 180 MG/ML  SOLN
INTRAMUSCULAR | Status: DC | PRN
Start: 2024-06-01 — End: 2024-06-01
  Administered 2024-06-01: 10 mL

## 2024-06-01 MED ORDER — MIDAZOLAM HCL 2 MG/2ML IJ SOLN
INTRAMUSCULAR | Status: AC
  Filled 2024-06-01: qty 2

## 2024-06-01 MED ORDER — FENTANYL CITRATE (PF) 100 MCG/2ML IJ SOLN
INTRAMUSCULAR | Status: DC | PRN
  Administered 2024-06-01: 100 ug via INTRAVENOUS

## 2024-06-01 MED ORDER — CHLORHEXIDINE GLUCONATE 0.12 % MT SOLN
OROMUCOSAL | Status: AC
  Filled 2024-06-01: qty 15

## 2024-06-01 MED ORDER — PROPOFOL 10 MG/ML IV BOLUS
INTRAVENOUS | Status: DC | PRN
Start: 2024-06-01 — End: 2024-06-01
  Administered 2024-06-01: 140 mg via INTRAVENOUS

## 2024-06-01 MED ORDER — DEXAMETHASONE SOD PHOSPHATE PF 10 MG/ML IJ SOLN
INTRAMUSCULAR | Status: DC | PRN
  Administered 2024-06-01: 5 mg via INTRAVENOUS

## 2024-06-01 MED ORDER — ONDANSETRON HCL 4 MG/2ML IJ SOLN
INTRAMUSCULAR | Status: DC | PRN
  Administered 2024-06-01: 4 mg via INTRAVENOUS

## 2024-06-01 MED ORDER — STERILE WATER FOR IRRIGATION IR SOLN
Status: DC | PRN
Start: 2024-06-01 — End: 2024-06-01
  Administered 2024-06-01: 500 mL

## 2024-06-01 MED ORDER — LIDOCAINE HCL (CARDIAC) PF 100 MG/5ML IV SOSY
PREFILLED_SYRINGE | INTRAVENOUS | Status: DC | PRN
  Administered 2024-06-01: 80 mg via INTRAVENOUS

## 2024-06-01 MED ORDER — SUCCINYLCHOLINE CHLORIDE 200 MG/10ML IV SOSY
PREFILLED_SYRINGE | INTRAVENOUS | Status: DC | PRN
Start: 2024-06-01 — End: 2024-06-01
  Administered 2024-06-01: 100 mg via INTRAVENOUS

## 2024-06-01 MED ORDER — SODIUM CHLORIDE 0.9 % IR SOLN
Status: DC | PRN
  Administered 2024-06-01: 3000 mL

## 2024-06-01 MED ORDER — CEFAZOLIN SODIUM-DEXTROSE 2-4 GM/100ML-% IV SOLN
INTRAVENOUS | Status: AC
  Filled 2024-06-01: qty 100

## 2024-06-01 MED ORDER — CEPHALEXIN 500 MG PO CAPS: 500.0000 mg | ORAL_CAPSULE | Freq: Once | ORAL | 0 refills | Status: DC | PRN

## 2024-06-01 MED ORDER — CEFAZOLIN SODIUM-DEXTROSE 2-4 GM/100ML-% IV SOLN
2.0000 g | INTRAVENOUS | Status: AC
  Administered 2024-06-01: 2 g via INTRAVENOUS

## 2024-06-01 MED ORDER — TRAMADOL HCL 50 MG PO TABS: 25.0000 mg | ORAL_TABLET | Freq: Four times a day (QID) | ORAL | 0 refills | Status: AC | PRN

## 2024-06-01 MED ORDER — KETOROLAC TROMETHAMINE 30 MG/ML IJ SOLN
INTRAMUSCULAR | Status: DC | PRN
  Administered 2024-06-01: 15 mg via INTRAVENOUS

## 2024-06-01 MED ORDER — EPHEDRINE SULFATE-NACL 50-0.9 MG/10ML-% IV SOSY
PREFILLED_SYRINGE | INTRAVENOUS | Status: DC | PRN
  Administered 2024-06-01: 5 mg via INTRAVENOUS

## 2024-06-01 MED ORDER — LACTATED RINGERS IV SOLN: INTRAVENOUS | Status: DC

## 2024-06-01 SURGICAL SUPPLY — 22 items
ADHESIVE MASTISOL STRL (MISCELLANEOUS) IMPLANT
BAG DRAIN SIEMENS DORNER NS (MISCELLANEOUS) ×1 IMPLANT
BRUSH SCRUB EZ 4% CHG (MISCELLANEOUS) ×1 IMPLANT
CATH URET FLEX-TIP 2 LUMEN 10F (CATHETERS) IMPLANT
CATH URETL OPEN 5X70 (CATHETERS) IMPLANT
DRAPE UTILITY 15X26 TOWEL STRL (DRAPES) ×1 IMPLANT
DRSG TEGADERM 2-3/8X2-3/4 SM (GAUZE/BANDAGES/DRESSINGS) IMPLANT
FIBER LASER MOSES 365 DFL (Laser) IMPLANT
GOWN STRL REUS W/ TWL LRG LVL3 (GOWN DISPOSABLE) ×1 IMPLANT
GOWN STRL REUS W/ TWL XL LVL3 (GOWN DISPOSABLE) ×1 IMPLANT
GUIDEWIRE STR DUAL SENSOR (WIRE) ×1 IMPLANT
KIT TURNOVER CYSTO (KITS) ×1 IMPLANT
PACK CYSTO AR (MISCELLANEOUS) ×1 IMPLANT
SET CYSTO IRRIGATION (SET/KITS/TRAYS/PACK) ×1 IMPLANT
SHEATH NAVIGATOR HD 12/14X36 (SHEATH) IMPLANT
SOL .9 NS 3000ML IRR UROMATIC (IV SOLUTION) ×1 IMPLANT
STENT URET 6FRX24 CONTOUR (STENTS) IMPLANT
STENT URET 6FRX26 CONTOUR (STENTS) IMPLANT
SURGILUBE 2OZ TUBE FLIPTOP (MISCELLANEOUS) ×1 IMPLANT
SYR 10ML LL (SYRINGE) ×1 IMPLANT
VALVE UROSEAL ADJ ENDO (VALVE) IMPLANT
WATER STERILE IRR 500ML POUR (IV SOLUTION) ×1 IMPLANT

## 2024-06-01 NOTE — H&P (Signed)
 06/01/24 11:30 AM   Courtney Cochran 25-Aug-1955 [id number]  CC: Left ureteral stones, left renal stones  HPI: 68 year old female with recurrent nephrolithiasis, recently presented with left ureteral stones and sepsis from urinary source/UTI, underwent left ureteral stent placement with Dr. Twylla and was treated with antibiotics.  Here today for definitive management with ureteroscopy.   PMH: Past Medical History:  Diagnosis Date   Anemia    COVID-19 09/11/2019   remdesivir  infusions   Diverticulosis    Family history of adverse reaction to anesthesia    PTS SISTER HAS WOKEN UP TWICE DURING SURGERY   GERD (gastroesophageal reflux disease)    Herpes simplex virus infection    History of kidney stones 08/2019   Hyperlipidemia    Multiple lung nodules on CT    Osteopenia    Pneumonia due to COVID-19 virus 08/2019   Sepsis (HCC)    Skin cancer    squamous cell    Surgical History: Past Surgical History:  Procedure Laterality Date   COLONOSCOPY  01/26/1997   COLONOSCOPY  05/26/2012   COLONOSCOPY  06/25/2017   COLONOSCOPY WITH PROPOFOL  N/A 07/25/2022   Procedure: COLONOSCOPY WITH PROPOFOL ;  Surgeon: Toledo, Ladell POUR, MD;  Location: [facility];  Service: Gastroenterology;  Laterality: N/A;   CYSTOSCOPY W/ RETROGRADES Left 10/02/2019   Procedure: CYSTOSCOPY WITH RETROGRADE PYELOGRAM;  Surgeon: Francisca Redell BROCKS, MD;  Location: [facility]S;  Service: Urology;  Laterality: Left;   CYSTOSCOPY W/ RETROGRADES  04/20/2022   Procedure: CYSTOSCOPY WITH RETROGRADE PYELOGRAM;  Surgeon: Francisca Redell BROCKS, MD;  Location: [facility] ORS;  Service: Urology;;   CYSTOSCOPY W/ URETERAL STENT PLACEMENT Left 03/28/2022   Procedure: CYSTOSCOPY WITH STENT REPLACEMENT;  Surgeon: Twylla Glendia BROCKS, MD;  Location: [facility] OR[facility];  Service: Urology;  Laterality: Left;   CYSTOSCOPY W/ URETERAL STENT PLACEMENT Left 05/01/2024   Procedure: CYSTOSCOPY, WITH RETROGRADE PYELOGRAM AND URETERAL STENT INSERTION;   Surgeon: Twylla Glendia BROCKS, MD;  Location: [facility];  Service: Urology;  Laterality: Left;   CYSTOSCOPY WITH STENT PLACEMENT Left 09/11/2019   Procedure: CYSTOSCOPY WITH STENT PLACEMENT;  Surgeon: Francisca Redell BROCKS, MD;  Location: [facility];  Service: Urology;  Laterality: Left;   CYSTOSCOPY/URETEROSCOPY/HOLMIUM LASER/STENT PLACEMENT Left 10/02/2019   Procedure: CYSTOSCOPY/URETEROSCOPY/HOLMIUM LASER/STENT EXCHANGE;  Surgeon: Francisca Redell BROCKS, MD;  Location: [facility];  Service: Urology;  Laterality: Left;   CYSTOSCOPY/URETEROSCOPY/HOLMIUM LASER/STENT PLACEMENT Left 04/20/2022   Procedure: CYSTOSCOPY/URETEROSCOPY/HOLMIUM LASER/STENT EXCHANGE;  Surgeon: Francisca Redell BROCKS, MD;  Location: [facility];  Service: Urology;  Laterality: Left;   DILATION AND CURETTAGE OF UTERUS     ESOPHAGOGASTRODUODENOSCOPY     1998, 2010   ESOPHAGOGASTRODUODENOSCOPY N/A 07/25/2022   Procedure: ESOPHAGOGASTRODUODENOSCOPY (EGD);  Surgeon: Toledo, Ladell POUR, MD;  Location: [facility];  Service: Gastroenterology;  Laterality: N/A;   KIDNEY STONE SURGERY     OPEN REDUCTION INTERNAL FIXATION (ORIF) DISTAL RADIAL FRACTURE Right 02/22/2016   Procedure: OPEN REDUCTION INTERNAL FIXATION (ORIF) DISTAL RADIAL FRACTURE;  Surgeon: Norleen JINNY Maltos, MD;  Location: [facility];  Service: Orthopedics;  Laterality: Right;   TUBAL LIGATION       Family History: Family History  Problem Relation Age of Onset   CAD Mother    CAD Father    Cancer Brother    Breast cancer Neg Hx     Social History:  reports that she quit smoking about 48 years ago. Her smoking use included cigarettes. She started smoking about 52 years ago. She has a 4 pack-year smoking history.  She has never used smokeless tobacco. She reports current alcohol use. She reports that she does not use drugs.  Physical Exam: BP 130/85   Pulse 63   Temp (!) 97.1 F (36.2 C)   Resp 17   SpO2 100%    Constitutional:  Alert and oriented, No acute  distress. Cardiovascular: Regular rate and rhythm Respiratory: Clear to auscultation bilaterally GI: Abdomen is soft, nontender, nondistended, no abdominal masses   Laboratory Data: Urine culture 10/4 no growth   Assessment & Plan:   68 year old female with left ureteral stones, previously stented for UTI/sepsis, here today for definitive management with ureteroscopy.  We specifically discussed the risks ureteroscopy including bleeding, infection/sepsis, stent related symptoms including flank pain/urgency/frequency/incontinence/dysuria, ureteral injury, ureteral stricture, inability to access stone, or need for staged or additional procedures.  Left ureteroscopy, laser lithotripsy, stent change   Redell Burnet, MD 06/01/2024  [facility] [address], [address] [place], [place] [address] [phone]

## 2024-06-01 NOTE — Anesthesia Procedure Notes (Addendum)
 Procedure Name: Intubation Date/Time: 06/01/2024 11:42 AM  Performed by: Landy Francena BIRCH, CRNAPre-anesthesia Checklist: Patient identified, Emergency Drugs available, Suction available and Patient being monitored Patient Re-evaluated:Patient Re-evaluated prior to induction Oxygen Delivery Method: Circle system utilized Preoxygenation: Pre-oxygenation with 100% oxygen Induction Type: IV induction Laryngoscope Size: McGrath and 3 Grade View: Grade I Tube type: Oral Tube size: 7.0 mm Number of attempts: 1 Airway Equipment and Method: Stylet, Oral airway and Bite block Placement Confirmation: ETT inserted through vocal cords under direct vision, positive ETCO2 and breath sounds checked- equal and bilateral Secured at: 20 cm Tube secured with: Tape Dental Injury: Teeth and Oropharynx as per pre-operative assessment

## 2024-06-01 NOTE — Anesthesia Postprocedure Evaluation (Signed)
 Anesthesia Post Note  Patient: Courtney Cochran  Procedure(s) Performed: CYSTOSCOPY/URETEROSCOPY/HOLMIUM LASER/STENT PLACEMENT (Left: Ureter)  Patient location during evaluation: PACU Anesthesia Type: General Level of consciousness: awake Pain management: satisfactory to patient Vital Signs Assessment: post-procedure vital signs reviewed and stable Respiratory status: spontaneous breathing Cardiovascular status: stable Anesthetic complications: no   No notable events documented.   Last Vitals:  Vitals:   06/01/24 1300 06/01/24 1314  BP: 120/73 130/75  Pulse: 62 70  Resp: 15 16  Temp:  (!) 36.4 C  SpO2: 99% 99%    Last Pain:  Vitals:   06/01/24 1314  TempSrc: Temporal  PainSc: 0-No pain                 VAN STAVEREN,Ashlen Kiger

## 2024-06-01 NOTE — Op Note (Signed)
 Date of procedure: 06/01/24  Preoperative diagnosis:  Left ureteral stone Left renal stone  Postoperative diagnosis:  Same  Procedure: Cystoscopy, left retrograde pyelogram with intraoperative interpretation, left ureteral stent placement Left ureteroscopy, laser lithotripsy of ureteral stones Left ureteroscopy, laser lithotripsy of renal stones  Surgeon: Redell Burnet, MD  Anesthesia: General  Complications: None  Intraoperative findings:  Normal cystoscopy Uncomplicated dusting of all left ureteral and renal stones, stent placed with Dangler  EBL: Minimal  Specimens: None  Drains: Left 6 French by 24 cm ureteral stent with Dangler  Indication: Courtney Cochran is a 68 y.o. patient with left ureteral and left renal stones previously stented for infection, here today for definitive management with ureteroscopy.  After reviewing the management options for treatment, they elected to proceed with the above surgical procedure(s). We have discussed the potential benefits and risks of the procedure, side effects of the proposed treatment, the likelihood of the patient achieving the goals of the procedure, and any potential problems that might occur during the procedure or recuperation. Informed consent has been obtained.  Description of procedure:  The patient was taken to the operating room and general anesthesia was induced. SCDs were placed for DVT prophylaxis. The patient was placed in the dorsal lithotomy position, prepped and draped in the usual sterile fashion, and preoperative antibiotics were administered. A preoperative time-out was performed.   A 21 French rigid cystoscope was used to intubate the urethra and thorough cystoscopy was performed.  The bladder was grossly normal.  The left ureteral stent was grasped and pulled to the meatus and a sensor wire passed through the stent up to the kidney under fluoroscopic vision.  A semirigid long ureteroscope was advanced  alongside the wire and a 6 mm ureteral stone was encountered in the mid ureter.  This was dusted with a 365 m laser fiber and settings of 1.0 J and 10 Hz.  The scope was advanced further into the proximal ureter and another small stone was identified, this was also dusted with previously mentioned settings.  Pullback ureteroscopy showed no other fragments larger than the laser fiber.  A digital single-channel flexible ureteroscope was then advanced up into the left kidney and thorough pyeloscopy revealed multiple small stones and Randall's plaques.  The 365 m laser fiber on settings of 0.5 J and 80 Hz was used to methodically dust all stones.  Thorough pyeloscopy showed no other stones larger than the laser fiber.  A retrograde pyelogram was performed from the proximal ureter and showed no extravasation or filling defects.  Pullback ureteroscopy showed no other stone fragments or ureteral injury.  A sensor wire was advanced up into the left kidney and the scope removed.  A 6 French by 24 cm ureteral stent was placed fluoroscopically with a curl in the kidney as well as in the bladder.  The bladder was drained and this concluded procedure.  Dangler was secured to the suprapubic region with Mastisol and Tegaderm.  Disposition: Stable to PACU  Plan: Can remove stent at home on Friday morning Follow-up in clinic in 3 months with 24-hour urine metabolic workup prior  Redell Burnet, MD

## 2024-06-01 NOTE — Transfer of Care (Signed)
 Immediate Anesthesia Transfer of Care Note  Patient: Courtney Cochran  Procedure(s) Performed: CYSTOSCOPY/URETEROSCOPY/HOLMIUM LASER/STENT PLACEMENT (Left: Ureter)  Patient Location: PACU  Anesthesia Type:General  Level of Consciousness: awake, alert , oriented, and patient cooperative  Airway & Oxygen Therapy: Patient Spontanous Breathing and Patient connected to face mask oxygen  Post-op Assessment: Report given to RN, Post -op Vital signs reviewed and stable, and Patient moving all extremities X 4  Post vital signs: Reviewed and stable  Last Vitals:  Vitals Value Taken Time  BP 109/72 06/01/24 12:23  Temp    Pulse 65 06/01/24 12:23  Resp 14 06/01/24 12:23  SpO2 100 % 06/01/24 12:23  Vitals shown include unfiled device data.  Last Pain:  Vitals:   06/01/24 1111  PainSc: 0-No pain         Complications: No notable events documented.

## 2024-06-01 NOTE — Anesthesia Preprocedure Evaluation (Signed)
 Anesthesia Evaluation  Patient identified by MRN, date of birth, ID band Patient awake    Reviewed: Allergy & Precautions, NPO status , Patient's Chart, lab work & pertinent test results  Airway Mallampati: II  TM Distance: >3 FB Neck ROM: Full    Dental  (+) Teeth Intact   Pulmonary neg pulmonary ROS, Patient abstained from smoking., former smoker   Pulmonary exam normal breath sounds clear to auscultation       Cardiovascular Exercise Tolerance: Good negative cardio ROS Normal cardiovascular exam Rhythm:Regular Rate:Normal     Neuro/Psych negative neurological ROS  negative psych ROS   GI/Hepatic negative GI ROS, Neg liver ROS,GERD  ,,  Endo/Other  negative endocrine ROS    Renal/GU   negative genitourinary   Musculoskeletal   Abdominal   Peds negative pediatric ROS (+)  Hematology negative hematology ROS (+)   Anesthesia Other Findings Past Medical History: No date: Anemia 09/11/2019: COVID-19     Comment:  remdesivir  infusions No date: Diverticulosis No date: Family history of adverse reaction to anesthesia     Comment:  PTS SISTER HAS WOKEN UP TWICE DURING SURGERY No date: GERD (gastroesophageal reflux disease) No date: Herpes simplex virus infection 08/2019: History of kidney stones No date: Hyperlipidemia No date: Multiple lung nodules on CT No date: Osteopenia 08/2019: Pneumonia due to COVID-19 virus No date: Sepsis (HCC) No date: Skin cancer     Comment:  squamous cell  Past Surgical History: 01/26/1997: COLONOSCOPY 05/26/2012: COLONOSCOPY 06/25/2017: COLONOSCOPY 07/25/2022: COLONOSCOPY WITH PROPOFOL ; N/A     Comment:  Procedure: COLONOSCOPY WITH PROPOFOL ;  Surgeon: [place],               Ladell POUR, MD;  Location: [facility];  Service:               Gastroenterology;  Laterality: N/A; 10/02/2019: CYSTOSCOPY W/ RETROGRADES; Left     Comment:  Procedure: CYSTOSCOPY WITH RETROGRADE  PYELOGRAM;                Surgeon: Francisca Redell BROCKS, MD;  Location: [facility];                Service: Urology;  Laterality: Left; 04/20/2022: CYSTOSCOPY W/ RETROGRADES     Comment:  Procedure: CYSTOSCOPY WITH RETROGRADE PYELOGRAM;                Surgeon: Francisca Redell BROCKS, MD;  Location: [facility];                Service: Urology;; 03/28/2022: CYSTOSCOPY W/ URETERAL STENT PLACEMENT; Left     Comment:  Procedure: CYSTOSCOPY WITH STENT REPLACEMENT;  Surgeon:               Twylla Glendia BROCKS, MD;  Location: [facility];  Service:               Urology;  Laterality: Left; 05/01/2024: CYSTOSCOPY W/ URETERAL STENT PLACEMENT; Left     Comment:  Procedure: CYSTOSCOPY, WITH RETROGRADE PYELOGRAM AND               URETERAL STENT INSERTION;  Surgeon: Twylla Glendia BROCKS, MD;              Location: [facility];  Service: Urology;  Laterality: Left; 09/11/2019: CYSTOSCOPY WITH STENT PLACEMENT; Left     Comment:  Procedure: CYSTOSCOPY WITH STENT PLACEMENT;  Surgeon:               Francisca Redell BROCKS, MD;  Location: [facility];  Service:               Urology;  Laterality: Left; 10/02/2019: CYSTOSCOPY/URETEROSCOPY/HOLMIUM LASER/STENT PLACEMENT;  Left     Comment:  Procedure: CYSTOSCOPY/URETEROSCOPY/HOLMIUM LASER/STENT               EXCHANGE;  Surgeon: Francisca Redell BROCKS, MD;  Location: [facility]              [facility];  Service: Urology;  Laterality: Left; 04/20/2022: CYSTOSCOPY/URETEROSCOPY/HOLMIUM LASER/STENT PLACEMENT; Left     Comment:  Procedure: CYSTOSCOPY/URETEROSCOPY/HOLMIUM LASER/STENT               EXCHANGE;  Surgeon: Francisca Redell BROCKS, MD;  Location: [facility];  Service: Urology;  Laterality: Left; No date: DILATION AND CURETTAGE OF UTERUS No date: ESOPHAGOGASTRODUODENOSCOPY     Comment:  1998, 2010 07/25/2022: ESOPHAGOGASTRODUODENOSCOPY; N/A     Comment:  Procedure: ESOPHAGOGASTRODUODENOSCOPY (EGD);  Surgeon:               [place], Ladell POUR, MD;  Location: [facility];                Service:  Gastroenterology;  Laterality: N/A; No date: KIDNEY STONE SURGERY 02/22/2016: OPEN REDUCTION INTERNAL FIXATION (ORIF) DISTAL RADIAL  FRACTURE; Right     Comment:  Procedure: OPEN REDUCTION INTERNAL FIXATION (ORIF)               DISTAL RADIAL FRACTURE;  Surgeon: Norleen JINNY Maltos, MD;                Location: [facility];  Service: Orthopedics;                Laterality: Right; No date: TUBAL LIGATION     Reproductive/Obstetrics negative OB ROS                              Anesthesia Physical Anesthesia Plan  ASA: 2  Anesthesia Plan: General   Post-op Pain Management:    Induction: Intravenous  PONV Risk Score and Plan: 1 and Ondansetron  and Dexamethasone   Airway Management Planned: Oral ETT  Additional Equipment:   Intra-op Plan:   Post-operative Plan: Extubation in OR  Informed Consent: I have reviewed the patients History and Physical, chart, labs and discussed the procedure including the risks, benefits and alternatives for the proposed anesthesia with the patient or authorized representative who has indicated his/her understanding and acceptance.     Dental Advisory Given  Plan Discussed with: CRNA  Anesthesia Plan Comments:          Anesthesia Quick Evaluation

## 2024-06-02 ENCOUNTER — Encounter: Payer: Self-pay | Admitting: Urology

## 2024-06-03 ENCOUNTER — Telehealth: Payer: Self-pay

## 2024-06-03 ENCOUNTER — Other Ambulatory Visit: Payer: Self-pay

## 2024-06-03 DIAGNOSIS — N201 Calculus of ureter: Secondary | ICD-10-CM

## 2024-06-03 NOTE — Telephone Encounter (Signed)
 [employer] ordered and info sent to patient.

## 2024-06-23 ENCOUNTER — Ambulatory Visit: Payer: Self-pay | Admitting: Urology

## 2024-06-23 LAB — LITHOLINK 24HR URINE PANEL
Ammonium, Urine: 24 mmol/(24.h) (ref 15–60)
Calcium Oxalate Saturation: 7.54 (ref 6.00–10.00)
Calcium Phosphate Saturation: 2.12 — ABNORMAL HIGH (ref 0.50–2.00)
Calcium, Urine: 169 mg/(24.h) (ref <200)
Calcium/Creatinine Ratio: 161 mg/g{creat} (ref 51–262)
Calcium/Kg Body Weight: 2.8 mg/kg/d (ref <4.0)
Chloride, Urine: 96 mmol/(24.h) (ref 70–250)
Citrate, Urine: 504 mg/(24.h) — ABNORMAL LOW (ref >550)
Creatinine, Urine: 1046 mg/(24.h)
Creatinine/Kg Body Weight: 17.1 mg/kg/d (ref 8.7–20.3)
Magnesium, Urine: 87 mg/(24.h) (ref 30–120)
Oxalate, Urine: 28 mg/(24.h) (ref 20–40)
Phosphorus, Urine: 646 mg/(24.h) (ref 600–1200)
Potassium, Urine: 52 mmol/(24.h) (ref 20–100)
Protein Catabolic Rate: 1.4 g/kg/d (ref 0.8–1.4)
Sodium, Urine: 111 mmol/(24.h) (ref 50–150)
Sulfate, Urine: 40 meq/(24.h) (ref 20–80)
Urea Nitrogen, Urine: 11.81 g/(24.h) (ref 6.00–14.00)
Uric Acid Saturation: 0.41 (ref <1.00)
Uric Acid, Urine: 681 mg/(24.h) (ref <750)
Urine Volume (Preserved): 1230 mL/(24.h) (ref 500–4000)
pH, 24 hr, Urine: 6.489 — ABNORMAL HIGH (ref 5.800–6.200)

## 2024-09-02 ENCOUNTER — Ambulatory Visit: Admitting: Urology

## 2024-09-02 VITALS — BP 147/91 | HR 75 | Ht 68.0 in | Wt 133.0 lb

## 2024-09-02 DIAGNOSIS — N200 Calculus of kidney: Secondary | ICD-10-CM

## 2024-09-02 NOTE — Progress Notes (Signed)
" ° °  09/02/2024 11:07 AM   Courtney Cochran [birth year] [id number]  Reason for visit: Follow up recurrent nephrolithiasis  History: Long history of recurrent nephrolithiasis, Stone analysis that showed 70% calcium phosphate, 30% calcium oxalate Started on indapamide  October 2023, changed to every other day by PCP secondary to hypokalemia 24-hour urine results have been notable for low urine volume, normal calcium on indapamide , low urinary citrate Most recent procedure was left-sided ureteroscopy and laser lithotripsy in November 2025  Physical Exam: BP (!) 147/91 (BP Location: Left Arm, Patient Position: Sitting, Cuff Size: Normal)   Pulse 75   Ht 5' 8 (1.727 m)   Wt 133 lb (60.3 kg)   SpO2 98%   BMI 20.22 kg/m   Imaging/labs: Most recent 24-hour urine from November 2025 notable for low urine volume of 1.2 L, low urine citrate of 504, calcium phosphate saturation elevated 2.1, urine pH 6.5, normal urine sodium 111, normal urine calcium 169  Today: Doing well, no flank pain, gross hematuria, or stone episodes  Plan:   Nephrolithiasis: 24-hour urine results reviewed, continue indapamide  every other day, encouraged to increase fluid intake with goal of 2.5 L urine output per day, and increase citrate.  RTC 1 year KUB   Courtney JAYSON Burnet, MD  [facility] [address] [place], [place] [address] [phone]  "

## 2024-09-02 NOTE — Patient Instructions (Signed)
" ° ° ° ° ° ° ° ° ° ° ° ° ° ° ° ° °  Your main risk factors for stone formation continue to be low fluid intake, goal should be 2.5 L of urine output per day, and low urine citrate which is a stone preventer.  The easiest way to increase citrate in the diet is fruits and vegetables and Crystal light lemonade "

## 2024-09-23 ENCOUNTER — Ambulatory Visit: Admitting: Urology

## 2024-09-24 ENCOUNTER — Ambulatory Visit: Payer: Medicare Other | Admitting: Urology

## 2025-09-02 ENCOUNTER — Ambulatory Visit: Admitting: Urology
